# Patient Record
Sex: Female | Born: 1960
Health system: Southern US, Community
[De-identification: ages and names within clinical notes are randomized; demographics above are authoritative.]

## PROBLEM LIST (undated history)

## (undated) DIAGNOSIS — E7800 Pure hypercholesterolemia, unspecified: Secondary | ICD-10-CM

## (undated) DIAGNOSIS — Z8619 Personal history of other infectious and parasitic diseases: Secondary | ICD-10-CM

## (undated) DIAGNOSIS — H269 Unspecified cataract: Secondary | ICD-10-CM

## (undated) DIAGNOSIS — J309 Allergic rhinitis, unspecified: Secondary | ICD-10-CM

## (undated) DIAGNOSIS — K219 Gastro-esophageal reflux disease without esophagitis: Secondary | ICD-10-CM

## (undated) DIAGNOSIS — Z9889 Other specified postprocedural states: Secondary | ICD-10-CM

## (undated) DIAGNOSIS — T7840XA Allergy, unspecified, initial encounter: Secondary | ICD-10-CM

## (undated) DIAGNOSIS — D509 Iron deficiency anemia, unspecified: Secondary | ICD-10-CM

## (undated) DIAGNOSIS — F419 Anxiety disorder, unspecified: Secondary | ICD-10-CM

## (undated) DIAGNOSIS — I10 Essential (primary) hypertension: Secondary | ICD-10-CM

## (undated) DIAGNOSIS — E039 Hypothyroidism, unspecified: Secondary | ICD-10-CM

## (undated) DIAGNOSIS — K589 Irritable bowel syndrome without diarrhea: Secondary | ICD-10-CM

## (undated) DIAGNOSIS — D682 Hereditary deficiency of other clotting factors: Secondary | ICD-10-CM

## (undated) DIAGNOSIS — Z880 Allergy status to penicillin: Secondary | ICD-10-CM

## (undated) DIAGNOSIS — D519 Vitamin B12 deficiency anemia, unspecified: Secondary | ICD-10-CM

## (undated) DIAGNOSIS — R112 Nausea with vomiting, unspecified: Secondary | ICD-10-CM

## (undated) DIAGNOSIS — K200 Eosinophilic esophagitis: Secondary | ICD-10-CM

## (undated) DIAGNOSIS — G629 Polyneuropathy, unspecified: Secondary | ICD-10-CM

## (undated) DIAGNOSIS — J45909 Unspecified asthma, uncomplicated: Secondary | ICD-10-CM

## (undated) HISTORY — DX: Anxiety disorder, unspecified: F41.9

## (undated) HISTORY — DX: Allergic rhinitis, unspecified: J30.9

## (undated) HISTORY — DX: Irritable bowel syndrome, unspecified: K58.9

## (undated) HISTORY — DX: Gastro-esophageal reflux disease without esophagitis: K21.9

## (undated) HISTORY — PX: OTHER SURGICAL HISTORY: SHX169

## (undated) HISTORY — DX: Eosinophilic esophagitis: K20.0

## (undated) HISTORY — DX: Personal history of other infectious and parasitic diseases: Z86.19

## (undated) HISTORY — DX: Allergy, unspecified, initial encounter: T78.40XA

## (undated) HISTORY — PX: UPPER GASTROINTESTINAL ENDOSCOPY: SHX188

## (undated) HISTORY — DX: Iron deficiency anemia, unspecified: D50.9

## (undated) HISTORY — DX: Allergy status to penicillin: Z88.0

## (undated) HISTORY — DX: Hypothyroidism, unspecified: E03.9

## (undated) HISTORY — DX: Unspecified asthma, uncomplicated: J45.909

## (undated) HISTORY — DX: Vitamin B12 deficiency anemia, unspecified: D51.9

## (undated) HISTORY — DX: Hereditary deficiency of other clotting factors: D68.2

## (undated) HISTORY — PX: CHOLECYSTECTOMY: SHX55

## (undated) HISTORY — DX: Polyneuropathy, unspecified: G62.9

## (undated) HISTORY — DX: Unspecified cataract: H26.9

## (undated) HISTORY — DX: Other specified postprocedural states: Z98.890

## (undated) HISTORY — DX: Essential (primary) hypertension: I10

## (undated) HISTORY — PX: TOTAL ABDOMINAL HYSTERECTOMY: SHX209

## (undated) HISTORY — DX: Nausea with vomiting, unspecified: R11.2

## (undated) HISTORY — PX: COLONOSCOPY: SHX174

---

## 2014-06-16 HISTORY — PX: ESOPHAGOGASTRODUODENOSCOPY: SHX1529

## 2016-02-04 DIAGNOSIS — J0100 Acute maxillary sinusitis, unspecified: Secondary | ICD-10-CM | POA: Diagnosis not present

## 2016-02-11 DIAGNOSIS — J019 Acute sinusitis, unspecified: Secondary | ICD-10-CM | POA: Diagnosis not present

## 2016-02-11 DIAGNOSIS — K521 Toxic gastroenteritis and colitis: Secondary | ICD-10-CM | POA: Diagnosis not present

## 2016-02-11 DIAGNOSIS — Z6821 Body mass index (BMI) 21.0-21.9, adult: Secondary | ICD-10-CM | POA: Diagnosis not present

## 2016-03-06 DIAGNOSIS — F411 Generalized anxiety disorder: Secondary | ICD-10-CM | POA: Diagnosis not present

## 2016-04-16 DIAGNOSIS — E039 Hypothyroidism, unspecified: Secondary | ICD-10-CM | POA: Diagnosis not present

## 2016-04-16 DIAGNOSIS — E785 Hyperlipidemia, unspecified: Secondary | ICD-10-CM | POA: Diagnosis not present

## 2016-04-16 DIAGNOSIS — D519 Vitamin B12 deficiency anemia, unspecified: Secondary | ICD-10-CM | POA: Diagnosis not present

## 2016-04-16 DIAGNOSIS — D509 Iron deficiency anemia, unspecified: Secondary | ICD-10-CM | POA: Diagnosis not present

## 2016-04-16 DIAGNOSIS — I10 Essential (primary) hypertension: Secondary | ICD-10-CM | POA: Diagnosis not present

## 2016-04-16 DIAGNOSIS — F411 Generalized anxiety disorder: Secondary | ICD-10-CM | POA: Diagnosis not present

## 2016-04-16 DIAGNOSIS — Z1389 Encounter for screening for other disorder: Secondary | ICD-10-CM | POA: Diagnosis not present

## 2016-04-22 DIAGNOSIS — Z1231 Encounter for screening mammogram for malignant neoplasm of breast: Secondary | ICD-10-CM | POA: Diagnosis not present

## 2016-05-12 DIAGNOSIS — F411 Generalized anxiety disorder: Secondary | ICD-10-CM | POA: Diagnosis not present

## 2016-05-21 DIAGNOSIS — J019 Acute sinusitis, unspecified: Secondary | ICD-10-CM | POA: Diagnosis not present

## 2016-05-21 DIAGNOSIS — Z6821 Body mass index (BMI) 21.0-21.9, adult: Secondary | ICD-10-CM | POA: Diagnosis not present

## 2016-07-10 DIAGNOSIS — F411 Generalized anxiety disorder: Secondary | ICD-10-CM | POA: Diagnosis not present

## 2016-07-24 DIAGNOSIS — F411 Generalized anxiety disorder: Secondary | ICD-10-CM | POA: Diagnosis not present

## 2016-07-31 DIAGNOSIS — F411 Generalized anxiety disorder: Secondary | ICD-10-CM | POA: Diagnosis not present

## 2016-08-01 DIAGNOSIS — R8299 Other abnormal findings in urine: Secondary | ICD-10-CM | POA: Diagnosis not present

## 2016-08-01 DIAGNOSIS — J302 Other seasonal allergic rhinitis: Secondary | ICD-10-CM | POA: Diagnosis not present

## 2016-08-01 DIAGNOSIS — E039 Hypothyroidism, unspecified: Secondary | ICD-10-CM | POA: Diagnosis not present

## 2016-08-01 DIAGNOSIS — I10 Essential (primary) hypertension: Secondary | ICD-10-CM | POA: Diagnosis not present

## 2016-08-01 DIAGNOSIS — E785 Hyperlipidemia, unspecified: Secondary | ICD-10-CM | POA: Diagnosis not present

## 2016-08-05 DIAGNOSIS — M542 Cervicalgia: Secondary | ICD-10-CM | POA: Diagnosis not present

## 2016-08-05 DIAGNOSIS — R200 Anesthesia of skin: Secondary | ICD-10-CM | POA: Diagnosis not present

## 2016-08-06 DIAGNOSIS — F411 Generalized anxiety disorder: Secondary | ICD-10-CM | POA: Diagnosis not present

## 2016-08-11 DIAGNOSIS — E785 Hyperlipidemia, unspecified: Secondary | ICD-10-CM | POA: Diagnosis not present

## 2016-08-12 DIAGNOSIS — F411 Generalized anxiety disorder: Secondary | ICD-10-CM | POA: Diagnosis not present

## 2016-08-16 DIAGNOSIS — J0100 Acute maxillary sinusitis, unspecified: Secondary | ICD-10-CM | POA: Diagnosis not present

## 2016-08-20 DIAGNOSIS — F411 Generalized anxiety disorder: Secondary | ICD-10-CM | POA: Diagnosis not present

## 2016-08-22 DIAGNOSIS — L853 Xerosis cutis: Secondary | ICD-10-CM | POA: Diagnosis not present

## 2016-10-08 DIAGNOSIS — F411 Generalized anxiety disorder: Secondary | ICD-10-CM | POA: Diagnosis not present

## 2016-10-13 DIAGNOSIS — Z6821 Body mass index (BMI) 21.0-21.9, adult: Secondary | ICD-10-CM | POA: Diagnosis not present

## 2016-10-13 DIAGNOSIS — K580 Irritable bowel syndrome with diarrhea: Secondary | ICD-10-CM | POA: Diagnosis not present

## 2016-10-13 DIAGNOSIS — K219 Gastro-esophageal reflux disease without esophagitis: Secondary | ICD-10-CM | POA: Diagnosis not present

## 2016-10-21 DIAGNOSIS — E039 Hypothyroidism, unspecified: Secondary | ICD-10-CM | POA: Diagnosis not present

## 2016-10-21 DIAGNOSIS — Z6821 Body mass index (BMI) 21.0-21.9, adult: Secondary | ICD-10-CM | POA: Diagnosis not present

## 2016-10-21 DIAGNOSIS — I10 Essential (primary) hypertension: Secondary | ICD-10-CM | POA: Diagnosis not present

## 2016-10-21 DIAGNOSIS — E785 Hyperlipidemia, unspecified: Secondary | ICD-10-CM | POA: Diagnosis not present

## 2016-11-03 DIAGNOSIS — Z1231 Encounter for screening mammogram for malignant neoplasm of breast: Secondary | ICD-10-CM | POA: Diagnosis not present

## 2016-11-03 DIAGNOSIS — Z01419 Encounter for gynecological examination (general) (routine) without abnormal findings: Secondary | ICD-10-CM | POA: Diagnosis not present

## 2016-11-14 DIAGNOSIS — J0100 Acute maxillary sinusitis, unspecified: Secondary | ICD-10-CM | POA: Diagnosis not present

## 2016-11-17 DIAGNOSIS — J0100 Acute maxillary sinusitis, unspecified: Secondary | ICD-10-CM | POA: Diagnosis not present

## 2016-11-17 DIAGNOSIS — J209 Acute bronchitis, unspecified: Secondary | ICD-10-CM | POA: Diagnosis not present

## 2016-11-18 DIAGNOSIS — K200 Eosinophilic esophagitis: Secondary | ICD-10-CM | POA: Diagnosis not present

## 2016-11-24 ENCOUNTER — Ambulatory Visit (INDEPENDENT_AMBULATORY_CARE_PROVIDER_SITE_OTHER): Payer: Federal, State, Local not specified - PPO | Admitting: Allergy and Immunology

## 2016-11-24 ENCOUNTER — Encounter: Payer: Self-pay | Admitting: Allergy and Immunology

## 2016-11-24 VITALS — BP 120/88 | HR 68 | Resp 18 | Ht 62.68 in | Wt 122.0 lb

## 2016-11-24 DIAGNOSIS — K200 Eosinophilic esophagitis: Secondary | ICD-10-CM | POA: Diagnosis not present

## 2016-11-24 DIAGNOSIS — J3089 Other allergic rhinitis: Secondary | ICD-10-CM | POA: Diagnosis not present

## 2016-11-24 DIAGNOSIS — K219 Gastro-esophageal reflux disease without esophagitis: Secondary | ICD-10-CM | POA: Diagnosis not present

## 2016-11-24 MED ORDER — RANITIDINE HCL 300 MG PO TABS: 300.0000 mg | ORAL_TABLET | Freq: Every day | ORAL | 5 refills | Status: DC

## 2016-11-24 MED ORDER — MONTELUKAST SODIUM 10 MG PO TABS: 10.0000 mg | ORAL_TABLET | Freq: Every day | ORAL | 5 refills | Status: DC

## 2016-11-24 NOTE — Patient Instructions (Addendum)
  1. Discontinue all dairy consumption  2. Treat inflammation:   A. OTC Rhinocort one spray each nostril once a day  B. montelukast 10 mg tablet 1 time per day  C. prednisone 10 mg one time a day for 10 days only  D. Continue swallowed flovent  3. Treat reflux:   A. Nexium 40 mg tablet in AM  B. ranitidine 300 mg in PM  C. slowly taper off all forms of caffeine and chocolate  4. Return to clinic in 3 weeks or earlier if problem  5. Sinus CT scan?

## 2016-11-24 NOTE — Progress Notes (Signed)
Follow-up Note  Referring Provider: No ref. provider found Primary Provider: Nicoletta Dress, MD Date of Office Visit: 11/24/2016  Subjective:   Sherri Ruiz (DOB: 1961/10/16) is a 56 y.o. female who returns to the [facility] [place] on 11/24/2016 in re-evaluation of the following:  HPI: Sherri Ruiz returns to this clinic in reevaluation of respiratory tract problems that have arisen over the course of the past several months. I have not seen her in his clinic since February 2016 at which time she had a history of allergic rhinitis and eosinophilic esophagitis.  She complains of having constant postnasal drip and nasal congestion and nasal stuffiness and some occasional clear runny nose and has had problems with her "tummy". She's been having gas and cramping. She's had a decreased ability to smell recently although no ugly nasal discharge.  Apparently in October she was diagnosed with "sinusitis" and given an antibiotic and although her symptoms never completely resolved she did much better and then she became sick in November was diagnosed with "bronchitis" and given a shot of steroids. She was then referred back to Dr. Lyndel Safe assuming that some of her issue was related to reflux and he sent her here.  For her history of eosinophilic esophagitis she did quite well when she performed avoidance measures directed against dairy and egg and peanut and still continues to consume wheat. She also had a slow taper of swallowed steroids which she discontinued in February 2016. She is now eating yogurt on a regular basis. She is now back on swallowed flovent.  Allergies as of 11/24/2016      Reactions   Clindamycin/lincomycin    Meperidine Other (See Comments)   unknown   Penicillin G Other (See Comments)   Unknown      Medication List      CALCIUM PO Take by mouth daily.   cetirizine 10 MG tablet Commonly known as:  ZYRTEC Take 10 mg by mouth daily.   DIGESTIVE ADVANTAGE PO Take  by mouth daily.   IBUPROFEN PO Take by mouth as needed.   levothyroxine 50 MCG tablet Commonly known as:  SYNTHROID, LEVOTHROID Take 50 mcg by mouth daily before breakfast.   lisinopril 5 MG tablet Commonly known as:  PRINIVIL,ZESTRIL Take 5 mg by mouth daily.   MINIVELLE 0.1 MG/24HR patch Generic drug:  estradiol   MUCINEX CHILDRENS PO Take by mouth as needed.   MULTIVITAMIN PO Take by mouth daily.   NASACORT ALLERGY 24HR 55 MCG/ACT Aero nasal inhaler Generic drug:  triamcinolone Place 1 spray into the nose daily.   NEXIUM 40 MG capsule Generic drug:  esomeprazole Take 40 mg by mouth at bedtime.   VITAMIN C PO Take by mouth daily.       Past Medical History:  Diagnosis Date  . Allergic rhinitis   . Eosinophilic esophagitis   . High blood pressure   . Hypothyroidism   . Penicillin allergy     Past Surgical History:  Procedure Laterality Date  . CHOLECYSTECTOMY    . ESOPHAGUS SURGERY    . OTHER SURGICAL HISTORY     Hysterectomy    Review of systems negative except as noted in HPI / PMHx or noted below:  Review of Systems  Constitutional: Negative.   HENT: Negative.   Eyes: Negative.   Respiratory: Negative.   Cardiovascular: Negative.   Gastrointestinal: Negative.   Genitourinary: Negative.   Musculoskeletal: Negative.   Skin: Negative.   Neurological: Negative.   Endo/Heme/Allergies: Negative.  Psychiatric/Behavioral: Negative.      Objective:   Vitals:   11/24/16 1127  BP: 120/88  Pulse: 68  Resp: 18   Height: 5' 2.68" (159.2 cm)  Weight: 122 lb (55.3 kg)   Physical Exam  Constitutional: She is well-developed, well-nourished, and in no distress.  HENT:  Head: Normocephalic.  Right Ear: Tympanic membrane, external ear and ear canal normal.  Left Ear: Tympanic membrane, external ear and ear canal normal.  Nose: Nose normal. No mucosal edema or rhinorrhea.  Mouth/Throat: Uvula is midline, oropharynx is clear and moist and mucous  membranes are normal. No oropharyngeal exudate.  Eyes: Conjunctivae are normal.  Neck: Trachea normal. No tracheal tenderness present. No tracheal deviation present. No thyromegaly present.  Cardiovascular: Normal rate, regular rhythm, S1 normal, S2 normal and normal heart sounds.   No murmur heard. Pulmonary/Chest: Breath sounds normal. No stridor. No respiratory distress. She has no wheezes. She has no rales.  Musculoskeletal: She exhibits no edema.  Lymphadenopathy:       Head (right side): No tonsillar adenopathy present.       Head (left side): No tonsillar adenopathy present.    She has no cervical adenopathy.  Neurological: She is alert. Gait normal.  Skin: No rash noted. She is not diaphoretic. No erythema. Nails show no clubbing.  Psychiatric: Mood and affect normal.    Diagnostics: None  Assessment and Plan:   1. Other allergic rhinitis   2. LPRD (laryngopharyngeal reflux disease)   3. Eosinophilic esophagitis     1. Discontinue all dairy consumption  2. Treat inflammation:   A. OTC Rhinocort one spray each nostril once a day  B. montelukast 10 mg tablet 1 time per day  C. prednisone 10 mg one time a day for 10 days only  D. Continue swallowed flovent  3. Treat reflux:   A. Nexium 40 mg tablet in AM  B. ranitidine 300 mg in PM  C. slowly taper off all forms of caffeine and chocolate  4. Return to clinic in 3 weeks or earlier if problem  5. Sinus CT scan?  I will assume that Sherri Ruiz may have significant inflammation of her upper airway and have her use anti-inflammatory agents as noted above and in addition she may have a little bit more LPR for which she will utilize the plan mentioned above to address her reflux. I've also encouraged her to eliminate all dairy consumption as she did so much better in the past with her eosinophilic esophagitis while all remaining off all dairy. I'll see her back in this clinic in 3 weeks or earlier if there is a problem. If she  still continues to have upper airway symptoms will image her upper airway.  Allena Katz, MD [place]

## 2016-11-26 ENCOUNTER — Telehealth: Payer: Self-pay | Admitting: *Deleted

## 2016-11-26 NOTE — Telephone Encounter (Signed)
Patient advised, she already has famotidine at home.

## 2016-11-26 NOTE — Telephone Encounter (Signed)
Famotidine 40mg  would be fine

## 2016-11-26 NOTE — Telephone Encounter (Signed)
You prescribed Ranitidine 300 for Sherri Ruiz when you saw her on Monday and she is struggling to swallow the tablets because of the size. She would like to know if she can take Famotidine instead. Please advise.

## 2016-12-15 ENCOUNTER — Encounter: Payer: Self-pay | Admitting: Allergy and Immunology

## 2016-12-15 ENCOUNTER — Ambulatory Visit (INDEPENDENT_AMBULATORY_CARE_PROVIDER_SITE_OTHER): Payer: Federal, State, Local not specified - PPO | Admitting: Allergy and Immunology

## 2016-12-15 VITALS — BP 130/74 | HR 64 | Resp 16

## 2016-12-15 DIAGNOSIS — K200 Eosinophilic esophagitis: Secondary | ICD-10-CM | POA: Diagnosis not present

## 2016-12-15 DIAGNOSIS — J3089 Other allergic rhinitis: Secondary | ICD-10-CM | POA: Diagnosis not present

## 2016-12-15 DIAGNOSIS — K219 Gastro-esophageal reflux disease without esophagitis: Secondary | ICD-10-CM

## 2016-12-15 NOTE — Patient Instructions (Addendum)
  1. Discontinue all dairy consumption: Almond milk?  2. Treat inflammation:   A. OTC Rhinocort one spray each nostril once a day  B. montelukast 10 mg tablet 1 time per day  3. Treat reflux:   A. Nexium 40 mg tablet in AM  B. famotidine 40 mg in PM  C. continue off all forms of caffeine and chocolate  4. Rhinoscopy with ENT to examine right sided 'lump' - Dr. Gaylyn Cheers 12/22/16       At 1:30 p  5. Return in 12 weeks or earlier if problem

## 2016-12-15 NOTE — Progress Notes (Signed)
Follow-up Note  Referring Provider: Nicoletta Dress, MD Primary Provider: Nicoletta Dress, MD Date of Office Visit: 12/15/2016  Subjective:   Sherri Ruiz (DOB: 1961-07-26) is a 56 y.o. female who returns to the [facility] [place] on 12/15/2016 in re-evaluation of the following:  HPI: Heily returns to this clinic in reevaluation of her allergic rhinitis and eosinophilic esophagitis. I'll last saw her in this clinic on 11/24/2016 at which point in time she appeared to be having issues with both her throat and upper airways.  Her throat is improved. She still continues to have a "lump" on the right side of her throat whenever she swallows.  Her upper airway congestion has resolved. She's not been having any significant respiratory tract symptoms involving both her upper or lower respiratory tract. She can smell without any difficulty at this point.  The issue with her "tummy" has resolved. She's not been having any cramping.  She is not entirely dairy free but is very close. She has eliminated caffeine and almost all chocolate consumption. She is not using any swallowed Flovent but has been consistently using a nasal steroid and a leukotriene modifier and Nexium and famotidine.  Allergies as of 12/15/2016      Reactions   Clindamycin/lincomycin    Meperidine Other (See Comments)   unknown   Penicillin G Other (See Comments)   Unknown      Medication List      CALCIUM PO Take by mouth daily.   DIGESTIVE ADVANTAGE PO Take by mouth daily.   famotidine 40 MG tablet Commonly known as:  PEPCID Take 40 mg by mouth daily.   levothyroxine 50 MCG tablet Commonly known as:  SYNTHROID, LEVOTHROID Take 50 mcg by mouth daily before breakfast.   lisinopril 5 MG tablet Commonly known as:  PRINIVIL,ZESTRIL Take 5 mg by mouth daily.   MINIVELLE 0.1 MG/24HR patch Generic drug:  estradiol   montelukast 10 MG tablet Commonly known as:  SINGULAIR Take 1 tablet (10 mg  total) by mouth at bedtime.   MULTIVITAMIN PO Take by mouth daily.   NEXIUM 40 MG capsule Generic drug:  esomeprazole Take 40 mg by mouth at bedtime.   RHINOCORT ALLERGY 32 MCG/ACT nasal spray Generic drug:  budesonide Place 1 spray into both nostrils daily.   VITAMIN C PO Take by mouth daily.       Past Medical History:  Diagnosis Date  . Allergic rhinitis   . Eosinophilic esophagitis   . High blood pressure   . Hypothyroidism   . Penicillin allergy     Past Surgical History:  Procedure Laterality Date  . CHOLECYSTECTOMY    . ESOPHAGUS SURGERY    . OTHER SURGICAL HISTORY     Hysterectomy    Review of systems negative except as noted in HPI / PMHx or noted below:  Review of Systems  Constitutional: Negative.   HENT: Negative.   Eyes: Negative.   Respiratory: Negative.   Cardiovascular: Negative.   Gastrointestinal: Negative.   Genitourinary: Negative.   Musculoskeletal: Negative.   Skin: Negative.   Neurological: Negative.   Endo/Heme/Allergies: Negative.   Psychiatric/Behavioral: Negative.      Objective:   Vitals:   12/15/16 1120  BP: 130/74  Pulse: 64  Resp: 16          Physical Exam  Constitutional: She is well-developed, well-nourished, and in no distress.  HENT:  Head: Normocephalic.  Right Ear: Tympanic membrane, external ear and ear canal normal.  Left Ear: Tympanic membrane, external ear and ear canal normal.  Nose: Nose normal. No mucosal edema or rhinorrhea.  Mouth/Throat: Uvula is midline, oropharynx is clear and moist and mucous membranes are normal. No oropharyngeal exudate.  Eyes: Conjunctivae are normal.  Neck: Trachea normal. No tracheal tenderness present. No tracheal deviation present. No thyromegaly present.  Cardiovascular: Normal rate, regular rhythm, S1 normal, S2 normal and normal heart sounds.   No murmur heard. Pulmonary/Chest: Breath sounds normal. No stridor. No respiratory distress. She has no wheezes. She has  no rales.  Musculoskeletal: She exhibits no edema.  Lymphadenopathy:       Head (right side): No tonsillar adenopathy present.       Head (left side): No tonsillar adenopathy present.    She has no cervical adenopathy.  Neurological: She is alert. Gait normal.  Skin: No rash noted. She is not diaphoretic. No erythema. Nails show no clubbing.  Psychiatric: Mood and affect normal.    Diagnostics: none  Assessment and Plan:   1. Other allergic rhinitis   2. LPRD (laryngopharyngeal reflux disease)   3. Eosinophilic esophagitis     1. Discontinue all dairy consumption: Almond milk?  2. Treat inflammation:   A. OTC Rhinocort one spray each nostril once a day  B. montelukast 10 mg tablet 1 time per day  3. Treat reflux:   A. Nexium 40 mg tablet in AM  B. famotidine 40 mg in PM  C. continue off all forms of caffeine and chocolate  4. Rhinoscopy with ENT to examine right sided throat 'lump'  5. Return in 12 weeks or earlier if problem  Ersie is better and I would like to continue her on anti-inflammatory medications for her respiratory tract and therapy directed against reflux as specified above and remain away from all dairy consumption assuming that this is the trigger giving rise to her eosinophilic esophagitis. However, I would like to have a ENT look at her throat given the fact that she does have a persistent area on the right side that feels as though there is a lump located in that area. We will try to get that appointment arranged sometime in the week or so. I will see her back in this clinic in 12 weeks or earlier if there is a problem.  Allena Katz, MD [facility] [place]

## 2016-12-17 ENCOUNTER — Telehealth: Payer: Self-pay | Admitting: Allergy and Immunology

## 2016-12-17 NOTE — Telephone Encounter (Signed)
Tried to call, unable to leave voicemail.

## 2016-12-17 NOTE — Telephone Encounter (Signed)
Sherri Ruiz wants to know if she can change her nexium to PM and famotidine to AM.  She is having trouble sleeping and thinks it has something to do with the medications.  Please Advise

## 2016-12-17 NOTE — Telephone Encounter (Signed)
She can try that combination and let use know.

## 2016-12-18 DIAGNOSIS — S29011A Strain of muscle and tendon of front wall of thorax, initial encounter: Secondary | ICD-10-CM | POA: Diagnosis not present

## 2016-12-18 DIAGNOSIS — E785 Hyperlipidemia, unspecified: Secondary | ICD-10-CM | POA: Diagnosis not present

## 2016-12-18 DIAGNOSIS — I10 Essential (primary) hypertension: Secondary | ICD-10-CM | POA: Diagnosis not present

## 2016-12-18 NOTE — Telephone Encounter (Signed)
Tried to call again, voicemail still full.

## 2016-12-19 DIAGNOSIS — F411 Generalized anxiety disorder: Secondary | ICD-10-CM | POA: Diagnosis not present

## 2016-12-19 NOTE — Telephone Encounter (Signed)
Patient advised of instructions. 

## 2016-12-26 DIAGNOSIS — S9032XA Contusion of left foot, initial encounter: Secondary | ICD-10-CM | POA: Diagnosis not present

## 2017-01-08 DIAGNOSIS — F411 Generalized anxiety disorder: Secondary | ICD-10-CM | POA: Diagnosis not present

## 2017-01-13 DIAGNOSIS — J309 Allergic rhinitis, unspecified: Secondary | ICD-10-CM | POA: Diagnosis not present

## 2017-01-13 DIAGNOSIS — Z6820 Body mass index (BMI) 20.0-20.9, adult: Secondary | ICD-10-CM | POA: Diagnosis not present

## 2017-01-13 DIAGNOSIS — J329 Chronic sinusitis, unspecified: Secondary | ICD-10-CM | POA: Diagnosis not present

## 2017-01-13 DIAGNOSIS — J019 Acute sinusitis, unspecified: Secondary | ICD-10-CM | POA: Diagnosis not present

## 2017-01-19 DIAGNOSIS — J342 Deviated nasal septum: Secondary | ICD-10-CM | POA: Diagnosis not present

## 2017-01-19 DIAGNOSIS — J343 Hypertrophy of nasal turbinates: Secondary | ICD-10-CM | POA: Diagnosis not present

## 2017-01-19 DIAGNOSIS — J3489 Other specified disorders of nose and nasal sinuses: Secondary | ICD-10-CM | POA: Diagnosis not present

## 2017-01-19 DIAGNOSIS — R0981 Nasal congestion: Secondary | ICD-10-CM | POA: Diagnosis not present

## 2017-01-20 DIAGNOSIS — F411 Generalized anxiety disorder: Secondary | ICD-10-CM | POA: Diagnosis not present

## 2017-01-26 DIAGNOSIS — J342 Deviated nasal septum: Secondary | ICD-10-CM | POA: Diagnosis not present

## 2017-01-26 DIAGNOSIS — J329 Chronic sinusitis, unspecified: Secondary | ICD-10-CM | POA: Diagnosis not present

## 2017-02-03 DIAGNOSIS — J3489 Other specified disorders of nose and nasal sinuses: Secondary | ICD-10-CM | POA: Diagnosis not present

## 2017-02-03 DIAGNOSIS — J343 Hypertrophy of nasal turbinates: Secondary | ICD-10-CM | POA: Diagnosis not present

## 2017-02-03 DIAGNOSIS — J342 Deviated nasal septum: Secondary | ICD-10-CM | POA: Diagnosis not present

## 2017-02-03 DIAGNOSIS — J329 Chronic sinusitis, unspecified: Secondary | ICD-10-CM | POA: Diagnosis not present

## 2017-02-04 DIAGNOSIS — J329 Chronic sinusitis, unspecified: Secondary | ICD-10-CM | POA: Diagnosis not present

## 2017-02-04 DIAGNOSIS — J3489 Other specified disorders of nose and nasal sinuses: Secondary | ICD-10-CM | POA: Diagnosis not present

## 2017-02-04 DIAGNOSIS — J342 Deviated nasal septum: Secondary | ICD-10-CM | POA: Diagnosis not present

## 2017-02-04 DIAGNOSIS — J343 Hypertrophy of nasal turbinates: Secondary | ICD-10-CM | POA: Diagnosis not present

## 2017-02-05 ENCOUNTER — Telehealth: Payer: Self-pay | Admitting: *Deleted

## 2017-02-05 NOTE — Telephone Encounter (Signed)
Sherri Ruiz saw Dr. Gaylyn Cheers recently and he is suggesting that she have surgery to correct her deviated septum in hopes that it will prevent sinus infections. She states that she finished a round of antibiotic and steroids and her sinus infection has cleared up. She did say that in the past, she typically has struggled to get rid of infections. She wants your advice about the surgery and whether or not she would benefit or if she should not have the surgery. Please advise.

## 2017-02-09 NOTE — Telephone Encounter (Signed)
Patient advised of instructions. 

## 2017-02-09 NOTE — Telephone Encounter (Signed)
I would recommend that she continue on treatment for the full 12 weeks (From February) and then we can discuss during her RV. There is no rush unless something changes regarding her upper airways.

## 2017-02-10 DIAGNOSIS — K581 Irritable bowel syndrome with constipation: Secondary | ICD-10-CM | POA: Diagnosis not present

## 2017-02-10 DIAGNOSIS — K200 Eosinophilic esophagitis: Secondary | ICD-10-CM | POA: Diagnosis not present

## 2017-02-26 ENCOUNTER — Other Ambulatory Visit: Payer: Self-pay | Admitting: *Deleted

## 2017-02-26 MED ORDER — MONTELUKAST SODIUM 10 MG PO TABS: ORAL_TABLET | ORAL | 0 refills | Status: DC

## 2017-03-05 DIAGNOSIS — K080 Exfoliation of teeth due to systemic causes: Secondary | ICD-10-CM | POA: Diagnosis not present

## 2017-03-09 ENCOUNTER — Ambulatory Visit: Payer: Federal, State, Local not specified - PPO | Admitting: Allergy and Immunology

## 2017-03-16 ENCOUNTER — Ambulatory Visit: Payer: Federal, State, Local not specified - PPO | Admitting: Allergy and Immunology

## 2017-03-20 ENCOUNTER — Ambulatory Visit: Payer: Federal, State, Local not specified - PPO | Admitting: Allergy and Immunology

## 2017-03-26 ENCOUNTER — Encounter: Payer: Self-pay | Admitting: Allergy and Immunology

## 2017-03-26 ENCOUNTER — Ambulatory Visit (INDEPENDENT_AMBULATORY_CARE_PROVIDER_SITE_OTHER): Payer: Federal, State, Local not specified - PPO | Admitting: Allergy and Immunology

## 2017-03-26 VITALS — BP 130/82 | HR 76 | Resp 16

## 2017-03-26 DIAGNOSIS — K219 Gastro-esophageal reflux disease without esophagitis: Secondary | ICD-10-CM | POA: Diagnosis not present

## 2017-03-26 DIAGNOSIS — G43909 Migraine, unspecified, not intractable, without status migrainosus: Secondary | ICD-10-CM | POA: Diagnosis not present

## 2017-03-26 DIAGNOSIS — K200 Eosinophilic esophagitis: Secondary | ICD-10-CM

## 2017-03-26 DIAGNOSIS — J3089 Other allergic rhinitis: Secondary | ICD-10-CM | POA: Diagnosis not present

## 2017-03-26 MED ORDER — NEXIUM 40 MG PO CPDR: DELAYED_RELEASE_CAPSULE | ORAL | 5 refills | Status: DC

## 2017-03-26 MED ORDER — FAMOTIDINE 40 MG PO TABS: ORAL_TABLET | ORAL | 5 refills | Status: DC

## 2017-03-26 MED ORDER — MONTELUKAST SODIUM 10 MG PO TABS: ORAL_TABLET | ORAL | 1 refills | Status: DC

## 2017-03-26 NOTE — Patient Instructions (Addendum)
  1. Continue to avoid all dairy consumption  2. Continue to Treat inflammation:   A. OTC Rhinocort one spray each nostril once a day  B. montelukast 10 mg tablet 1 time per day  3. Continue to Treat reflux:   A. Nexium 40 mg tablet in PM  B. famotidine 40 mg in AM  C. continue off all forms of caffeine and chocolate  4. Can add OTC antihistamine - loratadine/fexofenadine/cetirizine   5. Return in November 2018 or earlier if problem

## 2017-03-26 NOTE — Progress Notes (Signed)
Follow-up Note  Referring Provider: Nicoletta Dress, MD Primary Provider: Nicoletta Dress, MD Date of Office Visit: 03/26/2017  Subjective:   Sherri Ruiz (DOB: 28-Nov-1960) is a 56 y.o. female who returns to the [facility] [place] on 03/26/2017 in re-evaluation of the following:  HPI: Sherri Ruiz returns to this clinic in reevaluation of her eosinophilic esophagitis and allergic rhinitis. I last saw her in this clinic February 2018 at which point in time she had the sensation of a "lump" on the right side of her throat for which we referred her to ENT.    She appears to be doing quite well with her eosinophilic esophagitis even though she is not using a swallowed steroid at this point in time. While remaining away from dairy and using a combination of Nexium and famotidine and being very careful about caffeine and chocolate consumption she has done very well and has not had any obstructive episodes.  She was apparently developing a issue with a facial pain and fullness around her nasal bridge and eyes and she was seen by Dr. Delena Bali who treated her with antibiotics and a steroid shot and prednisone in March and apparently a follow-up CT scan identified no sinusitis. She did visit with an ENT doctor in evaluation of the "lump" and apparently there was no significant abnormality although there was identification of a deviated septum for which surgery was recommended.  Interestingly, Sherri Ruiz describes these episodes where she gets this intense nasal bridge and periorbital pressure and subsequently develops nausea and this entire episode usually lasts about 15 minutes. She believes that this occurs while she is outdoors and when she goes indoors she will develop resolution of this issue. She has had two of these episodes the past month. In the past she has had these more frequently and she is much better while consistently using her Rhinocort and montelukast regarding this issue.  She has a  distant history of asthma that has not required any therapy in quite a long period in time. She has started running outdoors again without any difficulty.  Allergies as of 03/26/2017      Reactions   Clindamycin/lincomycin    Meperidine Other (See Comments)   unknown   Penicillin G Other (See Comments)   Unknown      Medication List      CALCIUM PO Take by mouth daily.   DIGESTIVE ADVANTAGE PO Take by mouth daily.   famotidine 40 MG tablet Commonly known as:  PEPCID Take 40 mg by mouth daily.   levothyroxine 50 MCG tablet Commonly known as:  SYNTHROID, LEVOTHROID Take 50 mcg by mouth daily before breakfast.   lisinopril 5 MG tablet Commonly known as:  PRINIVIL,ZESTRIL Take 5 mg by mouth daily.   MINIVELLE 0.1 MG/24HR patch Generic drug:  estradiol   montelukast 10 MG tablet Commonly known as:  SINGULAIR Take one tablet once daily as directed   MULTIVITAMIN PO Take by mouth daily.   NEXIUM 40 MG capsule Generic drug:  esomeprazole Take 40 mg by mouth at bedtime.   RHINOCORT ALLERGY 32 MCG/ACT nasal spray Generic drug:  budesonide Place 1 spray into both nostrils daily.   VITAMIN C PO Take by mouth daily.       Past Medical History:  Diagnosis Date  . Allergic rhinitis   . Eosinophilic esophagitis   . High blood pressure   . Hypothyroidism   . LPRD (laryngopharyngeal reflux disease)   . Penicillin allergy  Past Surgical History:  Procedure Laterality Date  . CHOLECYSTECTOMY    . ESOPHAGUS SURGERY    . OTHER SURGICAL HISTORY     Hysterectomy    Review of systems negative except as noted in HPI / PMHx or noted below:  Review of Systems  Constitutional: Negative.   HENT: Negative.   Eyes: Negative.   Respiratory: Negative.   Cardiovascular: Negative.   Gastrointestinal: Negative.   Genitourinary: Negative.   Musculoskeletal: Negative.   Skin: Negative.   Neurological: Negative.   Endo/Heme/Allergies: Negative.     Psychiatric/Behavioral: Negative.      Objective:   Vitals:   03/26/17 1130  BP: 130/82  Pulse: 76  Resp: 16          Physical Exam  Constitutional: She is well-developed, well-nourished, and in no distress.  HENT:  Head: Normocephalic.  Right Ear: Tympanic membrane, external ear and ear canal normal.  Left Ear: Tympanic membrane, external ear and ear canal normal.  Nose: Nose normal. No mucosal edema or rhinorrhea.  Mouth/Throat: Uvula is midline, oropharynx is clear and moist and mucous membranes are normal. No oropharyngeal exudate.  Eyes: Conjunctivae are normal.  Neck: Trachea normal. No tracheal tenderness present. No tracheal deviation present. No thyromegaly present.  Cardiovascular: Normal rate, regular rhythm, S1 normal, S2 normal and normal heart sounds.   No murmur heard. Pulmonary/Chest: Breath sounds normal. No stridor. No respiratory distress. She has no wheezes. She has no rales.  Musculoskeletal: She exhibits no edema.  Lymphadenopathy:       Head (right side): No tonsillar adenopathy present.       Head (left side): No tonsillar adenopathy present.    She has no cervical adenopathy.  Neurological: She is alert. Gait normal.  Skin: No rash noted. She is not diaphoretic. No erythema. Nails show no clubbing.  Psychiatric: Mood and affect normal.    Diagnostics: none   Assessment and Plan:   1. Eosinophilic esophagitis   2. LPRD (laryngopharyngeal reflux disease)   3. Other allergic rhinitis   4. Migraine syndrome     1. Continue to avoid all dairy consumption  2. Continue to Treat inflammation:   A. OTC Rhinocort one spray each nostril once a day  B. montelukast 10 mg tablet 1 time per day  3. Continue to Treat reflux:   A. Nexium 40 mg tablet in PM  B. famotidine 40 mg in AM  C. continue off all forms of caffeine and chocolate  4. Can add OTC antihistamine - loratadine/fexofenadine/cetirizine   5. Return in November 2018 or earlier if  problem  Sherri Ruiz appears to be doing relatively well regarding her eosinophilic esophagitis and I'm not going to change any of her therapy at this point in time but she is welcome to discontinue her famotidine to make a determination about whether or not she requires this medication while consistently using her Nexium at this point. It does sound as though she is developing migraines and it is quite possible she has an allergic trigger giving rise to these migraines. But her frequency of migraines is relatively low  And the intensity appears to be low and were not really going to have her use any additional therapy except for that described above to address her atopic disease and what appears to be a migraine syndrome. I will see her back in this clinic in 2018 or earlier if there is a problem.  Allena Katz, MD [facility] [place]

## 2017-04-02 DIAGNOSIS — J019 Acute sinusitis, unspecified: Secondary | ICD-10-CM | POA: Diagnosis not present

## 2017-04-02 DIAGNOSIS — J3489 Other specified disorders of nose and nasal sinuses: Secondary | ICD-10-CM | POA: Diagnosis not present

## 2017-04-02 DIAGNOSIS — J343 Hypertrophy of nasal turbinates: Secondary | ICD-10-CM | POA: Diagnosis not present

## 2017-04-02 DIAGNOSIS — F411 Generalized anxiety disorder: Secondary | ICD-10-CM | POA: Diagnosis not present

## 2017-04-02 DIAGNOSIS — D519 Vitamin B12 deficiency anemia, unspecified: Secondary | ICD-10-CM | POA: Diagnosis not present

## 2017-04-02 DIAGNOSIS — J342 Deviated nasal septum: Secondary | ICD-10-CM | POA: Diagnosis not present

## 2017-04-14 DIAGNOSIS — F411 Generalized anxiety disorder: Secondary | ICD-10-CM | POA: Diagnosis not present

## 2017-04-21 DIAGNOSIS — E039 Hypothyroidism, unspecified: Secondary | ICD-10-CM | POA: Diagnosis not present

## 2017-04-21 DIAGNOSIS — I10 Essential (primary) hypertension: Secondary | ICD-10-CM | POA: Diagnosis not present

## 2017-04-21 DIAGNOSIS — K219 Gastro-esophageal reflux disease without esophagitis: Secondary | ICD-10-CM | POA: Diagnosis not present

## 2017-04-21 DIAGNOSIS — Z1389 Encounter for screening for other disorder: Secondary | ICD-10-CM | POA: Diagnosis not present

## 2017-04-21 DIAGNOSIS — E785 Hyperlipidemia, unspecified: Secondary | ICD-10-CM | POA: Diagnosis not present

## 2017-06-01 ENCOUNTER — Other Ambulatory Visit: Payer: Self-pay | Admitting: Allergy and Immunology

## 2017-06-01 NOTE — Telephone Encounter (Signed)
Sherri Ruiz called in and stated her gums have been extremely sensitive and have been bleeding a lot lately.  She also stated that she did a "google" search and found the culprit was PEPCID.  She was wondering if there was anything else she can take in its place, or lower the dose, whichever is best.  Please advise.

## 2017-06-01 NOTE — Telephone Encounter (Signed)
I have spoken with patient and advised on discontinuation of the famotidine. I advised to use tums if needed. She will call back in 1-2 weeks and let us know how the gums are doing.

## 2017-06-01 NOTE — Telephone Encounter (Signed)
Please advise and thank you. 

## 2017-06-01 NOTE — Telephone Encounter (Signed)
Please have patient discontinue famotidine without any replacement other than OTC antacids such as Tums. We need to determine if it is the famotidine giving rise to bleeding gums. If she still has bleeding gums she will require blood tests and she needs to contact me.

## 2017-06-03 DIAGNOSIS — J320 Chronic maxillary sinusitis: Secondary | ICD-10-CM | POA: Diagnosis not present

## 2017-06-03 DIAGNOSIS — K068 Other specified disorders of gingiva and edentulous alveolar ridge: Secondary | ICD-10-CM | POA: Diagnosis not present

## 2017-07-02 DIAGNOSIS — F411 Generalized anxiety disorder: Secondary | ICD-10-CM | POA: Diagnosis not present

## 2017-07-07 DIAGNOSIS — K580 Irritable bowel syndrome with diarrhea: Secondary | ICD-10-CM | POA: Diagnosis not present

## 2017-07-07 DIAGNOSIS — J019 Acute sinusitis, unspecified: Secondary | ICD-10-CM | POA: Diagnosis not present

## 2017-07-07 DIAGNOSIS — Z6821 Body mass index (BMI) 21.0-21.9, adult: Secondary | ICD-10-CM | POA: Diagnosis not present

## 2017-07-16 DIAGNOSIS — K080 Exfoliation of teeth due to systemic causes: Secondary | ICD-10-CM | POA: Diagnosis not present

## 2017-07-30 DIAGNOSIS — K080 Exfoliation of teeth due to systemic causes: Secondary | ICD-10-CM | POA: Diagnosis not present

## 2017-08-12 DIAGNOSIS — J0100 Acute maxillary sinusitis, unspecified: Secondary | ICD-10-CM | POA: Diagnosis not present

## 2017-09-04 DIAGNOSIS — J029 Acute pharyngitis, unspecified: Secondary | ICD-10-CM | POA: Diagnosis not present

## 2017-09-21 ENCOUNTER — Ambulatory Visit: Payer: Federal, State, Local not specified - PPO | Admitting: Allergy and Immunology

## 2017-10-08 DIAGNOSIS — R109 Unspecified abdominal pain: Secondary | ICD-10-CM | POA: Diagnosis not present

## 2017-10-08 DIAGNOSIS — R1011 Right upper quadrant pain: Secondary | ICD-10-CM | POA: Diagnosis not present

## 2017-10-08 DIAGNOSIS — K2970 Gastritis, unspecified, without bleeding: Secondary | ICD-10-CM | POA: Diagnosis not present

## 2017-10-10 DIAGNOSIS — K2900 Acute gastritis without bleeding: Secondary | ICD-10-CM | POA: Diagnosis not present

## 2017-10-10 DIAGNOSIS — D1771 Benign lipomatous neoplasm of kidney: Secondary | ICD-10-CM | POA: Diagnosis not present

## 2017-10-10 DIAGNOSIS — R6889 Other general symptoms and signs: Secondary | ICD-10-CM | POA: Diagnosis not present

## 2017-10-10 DIAGNOSIS — R112 Nausea with vomiting, unspecified: Secondary | ICD-10-CM | POA: Diagnosis not present

## 2017-10-13 DIAGNOSIS — R1011 Right upper quadrant pain: Secondary | ICD-10-CM | POA: Diagnosis not present

## 2017-10-15 DIAGNOSIS — K080 Exfoliation of teeth due to systemic causes: Secondary | ICD-10-CM | POA: Diagnosis not present

## 2017-11-13 DIAGNOSIS — J019 Acute sinusitis, unspecified: Secondary | ICD-10-CM | POA: Diagnosis not present

## 2017-11-13 DIAGNOSIS — Z6821 Body mass index (BMI) 21.0-21.9, adult: Secondary | ICD-10-CM | POA: Diagnosis not present

## 2017-11-13 DIAGNOSIS — Z20828 Contact with and (suspected) exposure to other viral communicable diseases: Secondary | ICD-10-CM | POA: Diagnosis not present

## 2017-11-16 ENCOUNTER — Other Ambulatory Visit: Payer: Self-pay | Admitting: Allergy and Immunology

## 2017-11-16 NOTE — Telephone Encounter (Signed)
Courtesy refill  

## 2017-11-18 DIAGNOSIS — F411 Generalized anxiety disorder: Secondary | ICD-10-CM | POA: Diagnosis not present

## 2017-12-08 DIAGNOSIS — Z01419 Encounter for gynecological examination (general) (routine) without abnormal findings: Secondary | ICD-10-CM | POA: Diagnosis not present

## 2017-12-10 DIAGNOSIS — K200 Eosinophilic esophagitis: Secondary | ICD-10-CM | POA: Diagnosis not present

## 2017-12-10 DIAGNOSIS — K3189 Other diseases of stomach and duodenum: Secondary | ICD-10-CM | POA: Diagnosis not present

## 2017-12-18 DIAGNOSIS — M8589 Other specified disorders of bone density and structure, multiple sites: Secondary | ICD-10-CM | POA: Diagnosis not present

## 2017-12-18 DIAGNOSIS — Z1231 Encounter for screening mammogram for malignant neoplasm of breast: Secondary | ICD-10-CM | POA: Diagnosis not present

## 2017-12-18 DIAGNOSIS — Z13820 Encounter for screening for osteoporosis: Secondary | ICD-10-CM | POA: Diagnosis not present

## 2017-12-20 ENCOUNTER — Other Ambulatory Visit: Payer: Self-pay | Admitting: Allergy and Immunology

## 2017-12-25 DIAGNOSIS — K200 Eosinophilic esophagitis: Secondary | ICD-10-CM | POA: Diagnosis not present

## 2017-12-25 DIAGNOSIS — K2900 Acute gastritis without bleeding: Secondary | ICD-10-CM | POA: Diagnosis not present

## 2017-12-25 DIAGNOSIS — K228 Other specified diseases of esophagus: Secondary | ICD-10-CM | POA: Diagnosis not present

## 2017-12-25 DIAGNOSIS — K2950 Unspecified chronic gastritis without bleeding: Secondary | ICD-10-CM | POA: Diagnosis not present

## 2017-12-25 DIAGNOSIS — K317 Polyp of stomach and duodenum: Secondary | ICD-10-CM | POA: Diagnosis not present

## 2017-12-25 DIAGNOSIS — Z8719 Personal history of other diseases of the digestive system: Secondary | ICD-10-CM | POA: Diagnosis not present

## 2017-12-25 DIAGNOSIS — K219 Gastro-esophageal reflux disease without esophagitis: Secondary | ICD-10-CM | POA: Diagnosis not present

## 2017-12-25 DIAGNOSIS — K3189 Other diseases of stomach and duodenum: Secondary | ICD-10-CM | POA: Diagnosis not present

## 2017-12-25 HISTORY — PX: ESOPHAGOGASTRODUODENOSCOPY: SHX1529

## 2017-12-31 DIAGNOSIS — K080 Exfoliation of teeth due to systemic causes: Secondary | ICD-10-CM | POA: Diagnosis not present

## 2018-01-01 DIAGNOSIS — H26491 Other secondary cataract, right eye: Secondary | ICD-10-CM | POA: Diagnosis not present

## 2018-01-01 DIAGNOSIS — H524 Presbyopia: Secondary | ICD-10-CM | POA: Diagnosis not present

## 2018-01-01 DIAGNOSIS — H2512 Age-related nuclear cataract, left eye: Secondary | ICD-10-CM | POA: Diagnosis not present

## 2018-01-02 DIAGNOSIS — H8113 Benign paroxysmal vertigo, bilateral: Secondary | ICD-10-CM | POA: Diagnosis not present

## 2018-01-02 DIAGNOSIS — R5383 Other fatigue: Secondary | ICD-10-CM | POA: Diagnosis not present

## 2018-01-04 ENCOUNTER — Other Ambulatory Visit: Payer: Self-pay | Admitting: Allergy and Immunology

## 2018-01-20 ENCOUNTER — Ambulatory Visit: Payer: Federal, State, Local not specified - PPO | Admitting: Allergy and Immunology

## 2018-01-20 DIAGNOSIS — H26491 Other secondary cataract, right eye: Secondary | ICD-10-CM | POA: Diagnosis not present

## 2018-01-22 DIAGNOSIS — B349 Viral infection, unspecified: Secondary | ICD-10-CM | POA: Diagnosis not present

## 2018-01-26 DIAGNOSIS — F411 Generalized anxiety disorder: Secondary | ICD-10-CM | POA: Diagnosis not present

## 2018-01-27 ENCOUNTER — Ambulatory Visit: Payer: Federal, State, Local not specified - PPO | Admitting: Allergy and Immunology

## 2018-01-28 ENCOUNTER — Encounter: Payer: Self-pay | Admitting: Allergy and Immunology

## 2018-01-28 ENCOUNTER — Ambulatory Visit (INDEPENDENT_AMBULATORY_CARE_PROVIDER_SITE_OTHER): Payer: Federal, State, Local not specified - PPO | Admitting: Allergy and Immunology

## 2018-01-28 VITALS — BP 130/80 | HR 72 | Resp 20

## 2018-01-28 DIAGNOSIS — K200 Eosinophilic esophagitis: Secondary | ICD-10-CM

## 2018-01-28 DIAGNOSIS — J3089 Other allergic rhinitis: Secondary | ICD-10-CM | POA: Diagnosis not present

## 2018-01-28 DIAGNOSIS — K219 Gastro-esophageal reflux disease without esophagitis: Secondary | ICD-10-CM

## 2018-01-28 MED ORDER — MONTELUKAST SODIUM 10 MG PO TABS: ORAL_TABLET | ORAL | 11 refills | Status: DC

## 2018-01-28 NOTE — Patient Instructions (Addendum)
  1. Continue to avoid all dairy consumption  2. Continue to Treat inflammation:   A. OTC Rhinocort one spray each nostril once a day  B. montelukast 10 mg tablet 1 time per day  3. Continue to Treat reflux:   A. Nexium 40 mg tablet in PM  B. continue off all forms of caffeine and chocolate  4. Can add OTC antihistamine - loratadine/fexofenadine/cetirizine   5. Return in 1 year or earlier if problem

## 2018-01-28 NOTE — Progress Notes (Signed)
Follow-up Note  Referring Provider: Nicoletta Dress, MD Primary Provider: Nicoletta Dress, MD Date of Office Visit: 01/28/2018  Subjective:   Sherri Ruiz (DOB: 1961-02-08) is a 57 y.o. female who returns to the [facility] [place] on 01/28/2018 in re-evaluation of the following:  HPI: Sherri Ruiz returns to this clinic in reevaluation of her eosinophilic esophagitis and allergic rhinitis and LPR.  Her last visit to this clinic was 26 Mar 2017.  Overall she has really been doing well with her eosinophilic esophagitis and she has had no significant swallowing issues.  She continues to use Nexium on a regular basis and she remains away from all dairy consumption and as well does not eat potatoes.  She has basically done very well with this condition for a few years.  She had a upper endoscopy performed on 1 March by Dr. Lyndel Safe which identified no eosinophilic esophagitis at this point.  She has been doing very well with her upper airways and it does not sound as though she has required an antibiotic or systemic steroid to treat any type of respiratory tract issue but over the course of the past 2 weeks she has had some more sneezing and nasal congestion and a little postnasal drip and throat clearing.  She restarted her Nasacort and her nose is a little bit better yet she still has some pressure.  She would like to restart her montelukast.  Allergies as of 01/28/2018      Reactions   Clindamycin/lincomycin    Meperidine Other (See Comments)   unknown   Penicillin G Other (See Comments)   Unknown      Medication List      atorvastatin 20 MG tablet Commonly known as:  LIPITOR TAKE 1 TABLET BY MOUTH EVERYDAY AT BEDTIME   CALCIUM PO Take by mouth daily.   DIGESTIVE ADVANTAGE PO Take by mouth daily.   levothyroxine 50 MCG tablet Commonly known as:  SYNTHROID, LEVOTHROID Take 50 mcg by mouth daily before breakfast.   lisinopril 5 MG tablet Commonly known as:   PRINIVIL,ZESTRIL Take 5 mg by mouth daily.   MINIVELLE 0.1 MG/24HR patch Generic drug:  estradiol   montelukast 10 MG tablet Commonly known as:  SINGULAIR Take one tablet by mouth once daily as directed.   MULTIVITAMIN PO Take by mouth daily.   NASACORT ALLERGY 24HR 55 MCG/ACT Aero nasal inhaler Generic drug:  triamcinolone Place 1 spray into the nose daily.   NEXIUM 40 MG capsule Generic drug:  esomeprazole Take one capsule daily as directed   VITAMIN C PO Take by mouth daily.   ZYRTEC PO Take by mouth.       Past Medical History:  Diagnosis Date  . Allergic rhinitis   . Eosinophilic esophagitis   . High blood pressure   . Hypothyroidism   . LPRD (laryngopharyngeal reflux disease)   . Penicillin allergy     Past Surgical History:  Procedure Laterality Date  . CHOLECYSTECTOMY    . ESOPHAGUS SURGERY    . OTHER SURGICAL HISTORY     Hysterectomy    Review of systems negative except as noted in HPI / PMHx or noted below:  Review of Systems  Constitutional: Negative.   HENT: Negative.   Eyes: Negative.   Respiratory: Negative.   Cardiovascular: Negative.   Gastrointestinal: Negative.   Genitourinary: Negative.   Musculoskeletal: Negative.   Skin: Negative.   Neurological: Negative.   Endo/Heme/Allergies: Negative.   Psychiatric/Behavioral: Negative.  Objective:   Vitals:   01/28/18 1653  BP: 130/80  Pulse: 72  Resp: 20          Physical Exam  Constitutional: She is well-developed, well-nourished, and in no distress.  HENT:  Head: Normocephalic.  Right Ear: Tympanic membrane, external ear and ear canal normal.  Left Ear: Tympanic membrane, external ear and ear canal normal.  Nose: Nose normal. No mucosal edema or rhinorrhea.  Mouth/Throat: Uvula is midline, oropharynx is clear and moist and mucous membranes are normal. No oropharyngeal exudate.  Eyes: Conjunctivae are normal.  Neck: Trachea normal. No tracheal tenderness present. No  tracheal deviation present. No thyromegaly present.  Cardiovascular: Normal rate, regular rhythm, S1 normal, S2 normal and normal heart sounds.  No murmur heard. Pulmonary/Chest: Breath sounds normal. No stridor. No respiratory distress. She has no wheezes. She has no rales.  Musculoskeletal: She exhibits no edema.  Lymphadenopathy:       Head (right side): No tonsillar adenopathy present.       Head (left side): No tonsillar adenopathy present.    She has no cervical adenopathy.  Neurological: She is alert. Gait normal.  Skin: No rash noted. She is not diaphoretic. No erythema. Nails show no clubbing.  Psychiatric: Mood and affect normal.    Diagnostics: none   Assessment and Plan:   1. Eosinophilic esophagitis   2. Other allergic rhinitis   3. LPRD (laryngopharyngeal reflux disease)     1. Continue to avoid all dairy consumption  2. Continue to Treat inflammation:   A. OTC Rhinocort one spray each nostril once a day  B. montelukast 10 mg tablet 1 time per day  3. Continue to Treat reflux:   A. Nexium 40 mg tablet in PM  B. continue off all forms of caffeine and chocolate  4. Can add OTC antihistamine - loratadine/fexofenadine/cetirizine   5. Return in 1 year or earlier if problem  Sherri Ruiz appears to be doing very well on her current therapy and I did make the recommendation that she consider remaining away from dairy as this dietary manipulation appears to have resulted in very good control of her eosinophilic esophagitis and there has not been redevelopment of this issue while using this diet.  For her upper airway issues she can continue to use a nasal steroid and a leukotriene modifier.  Her LPR has basically resolved and there may be an opportunity for her to consolidate her proton pump inhibitor use and she has the option of discontinuing this agent to see what happens without any therapy directed against reflux.  I will see her back in this clinic in a year or earlier if  there is a problem.  Sherri Katz, MD Allergy / Immunology [place]

## 2018-02-01 ENCOUNTER — Encounter: Payer: Self-pay | Admitting: Allergy and Immunology

## 2018-02-09 DIAGNOSIS — G44209 Tension-type headache, unspecified, not intractable: Secondary | ICD-10-CM | POA: Diagnosis not present

## 2018-02-09 DIAGNOSIS — R0789 Other chest pain: Secondary | ICD-10-CM | POA: Diagnosis not present

## 2018-02-09 DIAGNOSIS — R51 Headache: Secondary | ICD-10-CM | POA: Diagnosis not present

## 2018-02-09 DIAGNOSIS — Z6821 Body mass index (BMI) 21.0-21.9, adult: Secondary | ICD-10-CM | POA: Diagnosis not present

## 2018-03-02 DIAGNOSIS — F411 Generalized anxiety disorder: Secondary | ICD-10-CM | POA: Diagnosis not present

## 2018-03-30 DIAGNOSIS — K580 Irritable bowel syndrome with diarrhea: Secondary | ICD-10-CM | POA: Diagnosis not present

## 2018-03-30 DIAGNOSIS — G44209 Tension-type headache, unspecified, not intractable: Secondary | ICD-10-CM | POA: Diagnosis not present

## 2018-03-31 DIAGNOSIS — F411 Generalized anxiety disorder: Secondary | ICD-10-CM | POA: Diagnosis not present

## 2018-04-13 DIAGNOSIS — F411 Generalized anxiety disorder: Secondary | ICD-10-CM | POA: Diagnosis not present

## 2018-04-22 DIAGNOSIS — K080 Exfoliation of teeth due to systemic causes: Secondary | ICD-10-CM | POA: Diagnosis not present

## 2018-04-26 DIAGNOSIS — K080 Exfoliation of teeth due to systemic causes: Secondary | ICD-10-CM | POA: Diagnosis not present

## 2018-05-01 DIAGNOSIS — J208 Acute bronchitis due to other specified organisms: Secondary | ICD-10-CM | POA: Diagnosis not present

## 2018-05-10 DIAGNOSIS — J019 Acute sinusitis, unspecified: Secondary | ICD-10-CM | POA: Diagnosis not present

## 2018-05-10 DIAGNOSIS — J208 Acute bronchitis due to other specified organisms: Secondary | ICD-10-CM | POA: Diagnosis not present

## 2018-05-25 DIAGNOSIS — F411 Generalized anxiety disorder: Secondary | ICD-10-CM | POA: Diagnosis not present

## 2018-07-20 ENCOUNTER — Emergency Department (HOSPITAL_COMMUNITY): Payer: Federal, State, Local not specified - PPO

## 2018-07-20 ENCOUNTER — Encounter (HOSPITAL_COMMUNITY): Payer: Self-pay

## 2018-07-20 ENCOUNTER — Observation Stay (HOSPITAL_COMMUNITY)
Admission: EM | Admit: 2018-07-20 | Discharge: 2018-07-21 | Disposition: A | Discharge status: Home / Self Care | Payer: Federal, State, Local not specified - PPO | Attending: Internal Medicine | Admitting: Internal Medicine

## 2018-07-20 ENCOUNTER — Other Ambulatory Visit: Payer: Self-pay

## 2018-07-20 DIAGNOSIS — J309 Allergic rhinitis, unspecified: Secondary | ICD-10-CM | POA: Diagnosis not present

## 2018-07-20 DIAGNOSIS — R51 Headache: Secondary | ICD-10-CM | POA: Diagnosis not present

## 2018-07-20 DIAGNOSIS — Z881 Allergy status to other antibiotic agents status: Secondary | ICD-10-CM | POA: Diagnosis not present

## 2018-07-20 DIAGNOSIS — K219 Gastro-esophageal reflux disease without esophagitis: Secondary | ICD-10-CM | POA: Diagnosis not present

## 2018-07-20 DIAGNOSIS — Z79899 Other long term (current) drug therapy: Secondary | ICD-10-CM | POA: Insufficient documentation

## 2018-07-20 DIAGNOSIS — I10 Essential (primary) hypertension: Secondary | ICD-10-CM | POA: Diagnosis not present

## 2018-07-20 DIAGNOSIS — E785 Hyperlipidemia, unspecified: Secondary | ICD-10-CM | POA: Diagnosis present

## 2018-07-20 DIAGNOSIS — Z888 Allergy status to other drugs, medicaments and biological substances status: Secondary | ICD-10-CM | POA: Insufficient documentation

## 2018-07-20 DIAGNOSIS — R079 Chest pain, unspecified: Secondary | ICD-10-CM | POA: Diagnosis present

## 2018-07-20 DIAGNOSIS — R519 Headache, unspecified: Secondary | ICD-10-CM | POA: Insufficient documentation

## 2018-07-20 DIAGNOSIS — E7849 Other hyperlipidemia: Secondary | ICD-10-CM | POA: Diagnosis not present

## 2018-07-20 DIAGNOSIS — Z8249 Family history of ischemic heart disease and other diseases of the circulatory system: Secondary | ICD-10-CM | POA: Insufficient documentation

## 2018-07-20 DIAGNOSIS — Z809 Family history of malignant neoplasm, unspecified: Secondary | ICD-10-CM | POA: Diagnosis not present

## 2018-07-20 DIAGNOSIS — Z9049 Acquired absence of other specified parts of digestive tract: Secondary | ICD-10-CM | POA: Diagnosis not present

## 2018-07-20 DIAGNOSIS — E7800 Pure hypercholesterolemia, unspecified: Secondary | ICD-10-CM | POA: Diagnosis not present

## 2018-07-20 DIAGNOSIS — R0789 Other chest pain: Principal | ICD-10-CM | POA: Insufficient documentation

## 2018-07-20 DIAGNOSIS — Z832 Family history of diseases of the blood and blood-forming organs and certain disorders involving the immune mechanism: Secondary | ICD-10-CM | POA: Insufficient documentation

## 2018-07-20 DIAGNOSIS — Z87891 Personal history of nicotine dependence: Secondary | ICD-10-CM | POA: Diagnosis not present

## 2018-07-20 DIAGNOSIS — K224 Dyskinesia of esophagus: Secondary | ICD-10-CM | POA: Diagnosis present

## 2018-07-20 DIAGNOSIS — Z825 Family history of asthma and other chronic lower respiratory diseases: Secondary | ICD-10-CM | POA: Insufficient documentation

## 2018-07-20 DIAGNOSIS — Z885 Allergy status to narcotic agent status: Secondary | ICD-10-CM | POA: Diagnosis not present

## 2018-07-20 DIAGNOSIS — Z880 Allergy status to penicillin: Secondary | ICD-10-CM | POA: Insufficient documentation

## 2018-07-20 DIAGNOSIS — Z90710 Acquired absence of both cervix and uterus: Secondary | ICD-10-CM | POA: Insufficient documentation

## 2018-07-20 DIAGNOSIS — E039 Hypothyroidism, unspecified: Secondary | ICD-10-CM | POA: Diagnosis not present

## 2018-07-20 HISTORY — DX: Pure hypercholesterolemia, unspecified: E78.00

## 2018-07-20 LAB — CBC
HCT: 39.8 % (ref 36.0–46.0)
HEMATOCRIT: 43.6 % (ref 36.0–46.0)
Hemoglobin: 15.2 g/dL — ABNORMAL HIGH (ref 12.0–15.0)
Hemoglobin: 13.8 g/dL (ref 12.0–15.0)
MCH: 32.3 pg (ref 26.0–34.0)
MCH: 32 pg (ref 26.0–34.0)
MCHC: 34.9 g/dL (ref 30.0–36.0)
MCHC: 34.7 g/dL (ref 30.0–36.0)
MCV: 92.6 fL (ref 78.0–100.0)
MCV: 92.3 fL (ref 78.0–100.0)
PLATELETS: 239 10*3/uL (ref 150–400)
PLATELETS: 219 10*3/uL (ref 150–400)
RBC: 4.71 MIL/uL (ref 3.87–5.11)
RBC: 4.31 MIL/uL (ref 3.87–5.11)
RDW: 12.8 % (ref 11.5–15.5)
RDW: 12.7 % (ref 11.5–15.5)
WBC: 5.3 10*3/uL (ref 4.0–10.5)
WBC: 4.8 10*3/uL (ref 4.0–10.5)

## 2018-07-20 LAB — BASIC METABOLIC PANEL
ANION GAP: 6 (ref 5–15)
BUN: 7 mg/dL (ref 6–20)
CHLORIDE: 103 mmol/L (ref 98–111)
CO2: 32 mmol/L (ref 22–32)
Calcium: 9.3 mg/dL (ref 8.9–10.3)
Creatinine, Ser: 0.79 mg/dL (ref 0.44–1.00)
GFR calc Af Amer: >60 mL/min (ref >60)
Glucose, Bld: 99 mg/dL (ref 70–99)
POTASSIUM: 3.8 mmol/L (ref 3.5–5.1)
SODIUM: 141 mmol/L (ref 135–145)

## 2018-07-20 LAB — CREATININE, SERUM
Creatinine, Ser: 0.81 mg/dL (ref 0.44–1.00)
GFR calc Af Amer: >60 mL/min (ref >60)
GFR calc non Af Amer: >60 mL/min (ref >60)

## 2018-07-20 LAB — TROPONIN I: Troponin I: <0.03 ng/mL (ref <0.03)

## 2018-07-20 LAB — POCT I-STAT TROPONIN I: TROPONIN I, POC: 0.04 ng/mL (ref 0.00–0.08)

## 2018-07-20 MED ORDER — ACETAMINOPHEN 160 MG/5ML PO SOLN
650.0000 mg | Freq: Once | ORAL | Status: AC
  Administered 2018-07-20: 650 mg via ORAL
  Filled 2018-07-20: qty 20.3

## 2018-07-20 MED ORDER — LEVOTHYROXINE SODIUM 50 MCG PO TABS
50.0000 ug | ORAL_TABLET | Freq: Every day | ORAL | Status: DC
  Administered 2018-07-21: 50 ug via ORAL
  Filled 2018-07-20 (×2): qty 1

## 2018-07-20 MED ORDER — ALPRAZOLAM 0.25 MG PO TABS: 0.2500 mg | ORAL_TABLET | Freq: Two times a day (BID) | ORAL | Status: DC | PRN

## 2018-07-20 MED ORDER — ACETAMINOPHEN 325 MG PO TABS: 650.0000 mg | ORAL_TABLET | Freq: Four times a day (QID) | ORAL | Status: DC | PRN

## 2018-07-20 MED ORDER — ASPIRIN 81 MG PO CHEW
324.0000 mg | CHEWABLE_TABLET | Freq: Once | ORAL | Status: AC
  Administered 2018-07-20: 324 mg via ORAL
  Filled 2018-07-20: qty 4

## 2018-07-20 MED ORDER — ONDANSETRON HCL 4 MG/2ML IJ SOLN: 4.0000 mg | Freq: Four times a day (QID) | INTRAMUSCULAR | Status: DC | PRN

## 2018-07-20 MED ORDER — ENOXAPARIN SODIUM 40 MG/0.4ML ~~LOC~~ SOLN
40.0000 mg | SUBCUTANEOUS | Status: DC
  Filled 2018-07-20: qty 0.4

## 2018-07-20 MED ORDER — ONDANSETRON HCL 4 MG PO TABS: 4.0000 mg | ORAL_TABLET | Freq: Four times a day (QID) | ORAL | Status: DC | PRN

## 2018-07-20 MED ORDER — ACETAMINOPHEN 160 MG/5ML PO SOLN: 325.0000 mg | Freq: Once | ORAL | Status: DC

## 2018-07-20 MED ORDER — METOCLOPRAMIDE HCL 5 MG/ML IJ SOLN
10.0000 mg | Freq: Once | INTRAMUSCULAR | Status: AC
  Administered 2018-07-20: 10 mg via INTRAVENOUS
  Filled 2018-07-20: qty 2

## 2018-07-20 MED ORDER — ONDANSETRON HCL 4 MG/2ML IJ SOLN
4.0000 mg | Freq: Once | INTRAMUSCULAR | Status: AC
  Administered 2018-07-20: 4 mg via INTRAVENOUS
  Filled 2018-07-20: qty 2

## 2018-07-20 MED ORDER — LISINOPRIL 5 MG PO TABS
5.0000 mg | ORAL_TABLET | Freq: Every day | ORAL | Status: DC
  Administered 2018-07-20: 5 mg via ORAL
  Filled 2018-07-20 (×2): qty 1

## 2018-07-20 MED ORDER — ATORVASTATIN CALCIUM 20 MG PO TABS
20.0000 mg | ORAL_TABLET | Freq: Every day | ORAL | Status: DC
  Administered 2018-07-20: 20 mg via ORAL
  Filled 2018-07-20: qty 1

## 2018-07-20 MED ORDER — KETOROLAC TROMETHAMINE 15 MG/ML IJ SOLN
15.0000 mg | Freq: Once | INTRAMUSCULAR | Status: AC
  Administered 2018-07-20: 15 mg via INTRAVENOUS
  Filled 2018-07-20: qty 1

## 2018-07-20 MED ORDER — DIPHENHYDRAMINE HCL 50 MG/ML IJ SOLN
25.0000 mg | Freq: Once | INTRAMUSCULAR | Status: AC
  Administered 2018-07-20: 25 mg via INTRAVENOUS
  Filled 2018-07-20: qty 1

## 2018-07-20 MED ORDER — NITROGLYCERIN 0.4 MG SL SUBL
0.4000 mg | SUBLINGUAL_TABLET | SUBLINGUAL | Status: DC | PRN
  Administered 2018-07-20: 0.4 mg via SUBLINGUAL
  Filled 2018-07-20: qty 1

## 2018-07-20 MED ORDER — ACETAMINOPHEN 325 MG PO TABS
650.0000 mg | ORAL_TABLET | Freq: Once | ORAL | Status: DC
  Filled 2018-07-20: qty 2

## 2018-07-20 MED ORDER — ASPIRIN EC 81 MG PO TBEC
81.0000 mg | DELAYED_RELEASE_TABLET | Freq: Every day | ORAL | Status: DC
  Filled 2018-07-20: qty 1

## 2018-07-20 MED ORDER — GI COCKTAIL ~~LOC~~
30.0000 mL | Freq: Once | ORAL | Status: AC
  Administered 2018-07-20: 30 mL via ORAL
  Filled 2018-07-20: qty 30

## 2018-07-20 MED ORDER — ACETAMINOPHEN 650 MG RE SUPP: 650.0000 mg | Freq: Four times a day (QID) | RECTAL | Status: DC | PRN

## 2018-07-20 MED ORDER — BISMUTH SUBSALICYLATE 262 MG PO CHEW
262.0000 mg | CHEWABLE_TABLET | ORAL | Status: DC | PRN
  Filled 2018-07-20: qty 1

## 2018-07-20 MED ORDER — PANTOPRAZOLE SODIUM 40 MG PO TBEC
40.0000 mg | DELAYED_RELEASE_TABLET | Freq: Every day | ORAL | Status: DC
  Filled 2018-07-20 (×2): qty 1

## 2018-07-20 MED ORDER — NITROGLYCERIN 0.4 MG SL SUBL: 0.4000 mg | SUBLINGUAL_TABLET | SUBLINGUAL | Status: DC | PRN

## 2018-07-20 NOTE — Progress Notes (Signed)
Pt took Nexium from home at 1700. RN held [employer]. Pt refused Lovenox. RN educated pt on importance of Lovenox injection and on not taken medications from home while hospitalized.

## 2018-07-20 NOTE — ED Notes (Signed)
ED TO INPATIENT HANDOFF REPORT  Name/Age/Gender Sherri Ruiz 57 y.o. female  Code Status   Home/SNF/Other Home  Chief Complaint chest pain   Level of Care/Admitting Diagnosis ED Disposition    ED Disposition Condition [facility] Area: [place] [[id number]]  Level of Care: Telemetry [5]  Admit to tele based on following criteria: Other see comments  Comments: chest pain r/o ACS  Diagnosis: Chest pain at rest [300762]  Admitting Physician: Louellen Molder [phone]  Attending Physician: Louellen Molder [[id number]  PT Class (Do Not Modify): Observation [104]  PT Acc Code (Do Not Modify): Observation [10022]       Medical History Past Medical History:  Diagnosis Date  . Allergic rhinitis   . Eosinophilic esophagitis   . High blood pressure   . High cholesterol   . Hypothyroidism   . LPRD (laryngopharyngeal reflux disease)   . Penicillin allergy     Allergies Allergies  Allergen Reactions  . Clindamycin/Lincomycin Swelling  . Meperidine Nausea And Vomiting    unknown  . Penicillin G Other (See Comments)    Unknown/childhood allergy Has patient had a PCN reaction causing immediate rash, facial/tongue/throat swelling, SOB or lightheadedness with hypotension: Yes Has patient had a PCN reaction causing severe rash involving mucus membranes or skin necrosis: Unknown Has patient had a PCN reaction that required hospitalization: No Has patient had a PCN reaction occurring within the last 10 years: Unknown If all of the above answers are "NO", then may proceed with Cephalosporin use.      IV Location/Drains/Wounds Patient Lines/Drains/Airways Status   Active Line/Drains/Airways    Name:   Placement date:   Placement time:   Site:   Days:   Peripheral IV 07/20/18 Left Antecubital   07/20/18    1220    Antecubital   less than 1          Labs/Imaging Results for orders placed or performed during the hospital encounter of 07/20/18  (from the past 48 hour(s))  Basic metabolic panel     Status: None   Collection Time: 07/20/18 12:19 PM  Result Value Ref Range   Sodium 141 135 - 145 mmol/L   Potassium 3.8 3.5 - 5.1 mmol/L   Chloride 103 98 - 111 mmol/L   CO2 32 22 - 32 mmol/L   Glucose, Bld 99 70 - 99 mg/dL   BUN 7 6 - 20 mg/dL   Creatinine, Ser 0.79 0.44 - 1.00 mg/dL   Calcium 9.3 8.9 - 10.3 mg/dL   GFR calc non Af Amer >60 >60 mL/min   GFR calc Af Amer >60 >60 mL/min    Comment: (NOTE) The eGFR has been calculated using the CKD EPI equation. This calculation has not been validated in all clinical situations. eGFR's persistently <60 mL/min signify possible Chronic Kidney Disease.    Anion gap 6 5 - 15    Comment: Performed at [facility], [place] [address], [place], [place] [address]  CBC     Status: Abnormal   Collection Time: 07/20/18 12:19 PM  Result Value Ref Range   WBC 5.3 4.0 - 10.5 K/uL   RBC 4.71 3.87 - 5.11 MIL/uL   Hemoglobin 15.2 (H) 12.0 - 15.0 g/dL   HCT 43.6 36.0 - 46.0 %   MCV 92.6 78.0 - 100.0 fL   MCH 32.3 26.0 - 34.0 pg   MCHC 34.9 30.0 - 36.0 g/dL   RDW 12.8 11.5 - 15.5 %  Platelets 239 150 - 400 K/uL    Comment: Performed at [facility], [place] [address], [place], [place] [address]  POCT i-Stat troponin I     Status: None   Collection Time: 07/20/18 12:43 PM  Result Value Ref Range   Troponin i, poc 0.04 0.00 - 0.08 ng/mL   Comment 3            Comment: Due to the release kinetics of cTnI, a negative result within the first hours of the onset of symptoms does not rule out myocardial infarction with certainty. If myocardial infarction is still suspected, repeat the test at appropriate intervals.    Dg Chest 2 View  Result Date: 07/20/2018 CLINICAL DATA:  Chest pain. EXAM: CHEST - 2 VIEW COMPARISON:  None. FINDINGS: The heart size and mediastinal contours are within normal limits. Both lungs are clear. The visualized skeletal structures are  unremarkable. IMPRESSION: Normal chest. Electronically Signed   By: Lorriane Shire M.D.   On: 07/20/2018 11:33    Pending Labs [employer] (From admission, onward)    Start     Ordered   Signed and Held  HIV antibody (Routine Testing)  Once,   R     Signed and Held   Signed and Held  CBC  (enoxaparin (LOVENOX)    CrCl >/= 30 ml/min)  Once,   R    Comments:  Baseline for enoxaparin therapy IF NOT ALREADY DRAWN.  Notify MD if PLT < 100 K.    Signed and Held   Signed and Held  Creatinine, serum  (enoxaparin (LOVENOX)    CrCl >/= 30 ml/min)  Once,   R    Comments:  Baseline for enoxaparin therapy IF NOT ALREADY DRAWN.    Signed and Held   Signed and Held  Creatinine, serum  (enoxaparin (LOVENOX)    CrCl >/= 30 ml/min)  Weekly,   R    Comments:  while on enoxaparin therapy    Signed and Held   Signed and Held  Troponin I  Now then every 6 hours,   R     Signed and Held          Vitals/Pain Today's Vitals   07/20/18 1215 07/20/18 1221 07/20/18 1300 07/20/18 1321  BP:   118/81   Pulse: 61  76   Resp:      Temp:      TempSrc:      SpO2: 99%  98%   Weight:      Height:      PainSc:  7   5     Isolation Precautions No active isolations  Medications Medications  nitroGLYCERIN (NITROSTAT) SL tablet 0.4 mg (0.4 mg Sublingual Given 07/20/18 1254)  aspirin chewable tablet 324 mg (324 mg Oral Given 07/20/18 1257)  ondansetron (ZOFRAN) injection 4 mg (4 mg Intravenous Given 07/20/18 1321)  acetaminophen (TYLENOL) solution 650 mg (650 mg Oral Given 07/20/18 1350)  metoCLOPramide (REGLAN) injection 10 mg (10 mg Intravenous Given 07/20/18 1423)  diphenhydrAMINE (BENADRYL) injection 25 mg (25 mg Intravenous Given 07/20/18 1423)  gi cocktail (Maalox,Lidocaine,Donnatal) (30 mLs Oral Given 07/20/18 1515)  ketorolac (TORADOL) 15 MG/ML injection 15 mg (15 mg Intravenous Given 07/20/18 1515)    Mobility non-ambulatory

## 2018-07-20 NOTE — ED Triage Notes (Signed)
Patient c/o intermittent mid chest pain, nausea and dry heaves since 0800 today. Patient states she took gas medicine with no relief, took Pepto with no relief, and then took xanax. Patient states it made her tired and then continued to have mid chest pain and then a headache afterwards. Patient denies SOB.

## 2018-07-20 NOTE — ED Notes (Signed)
Pt is alert and oriented   and verbally responsive. Pt  Has dtr and husband at bedside. PT reports centralzied chest pain pressure 7/10 and HA pain 10/10 throbbing. Pt report onset occurred this AM and pt has some associated nausea and reports dry heaving today.

## 2018-07-20 NOTE — Consult Note (Signed)
CARDIOLOGY CONSULT NOTE  Patient ID: Sherri Ruiz MRN: [id number] DOB/AGE: Feb 28, 1961 57 y.o.  Admit date: 07/20/2018 Primary Physician Nicoletta Dress, MD Primary Cardiologist None Chief Complaint  Chest pain Requesting   Dr. Clementeen Graham  HPI:   The patient has no past cardiac history.  Today she was at rest.  She began to develop some lower chest discomfort that was like a squeezing sharp discomfort.  It became more severe and was slightly under her left breast.  She has not had this before despite having multiple problems with her esophagus.  She tried taking some gas relieving medication over-the-counter and then Pepto-Bismol.  She had no improvement.  Her husband thought it was a panic attack so she took a Xanax.  She still had no improvement.  She called her primary care doctor and was told to come to the emergency room.  By the time she got here her symptoms were severe and it had been over an hour.  She did not have radiation to her jaw or to her arms.  She was nauseated.  She did not vomit.  She did not have excessive diaphoresis.  She did not have extreme shortness of breath.  In the emergency room she was given Toradol, Reglan, nitroglycerin, GI cocktail, Tylenol.  She has eventually had resolution of her discomfort although she still has a vague sensation in her chest.  She is otherwise active and she exercises working out on a treadmill.  She has not been able to bring on any symptoms.  She denies any cardiovascular history and has not had any prior testing.  There is a vague history of her father having heart attack in his 60s but he is still alive in his 76s.  Is not had stenting or bypass that she can recall.  She otherwise does not have significant risk factors with some mild hypertension and dyslipidemia.   Past Medical History:  Diagnosis Date  . Allergic rhinitis   . Eosinophilic esophagitis   . High blood pressure   . High cholesterol   . Hypothyroidism   . LPRD  (laryngopharyngeal reflux disease)   . Penicillin allergy     Past Surgical History:  Procedure Laterality Date  . CHOLECYSTECTOMY    . ESOPHAGUS SURGERY    . OTHER SURGICAL HISTORY     Hysterectomy    Allergies  Allergen Reactions  . Clindamycin/Lincomycin Swelling  . Meperidine Nausea And Vomiting    unknown  . Penicillin G Other (See Comments)    Unknown/childhood allergy Has patient had a PCN reaction causing immediate rash, facial/tongue/throat swelling, SOB or lightheadedness with hypotension: Yes Has patient had a PCN reaction causing severe rash involving mucus membranes or skin necrosis: Unknown Has patient had a PCN reaction that required hospitalization: No Has patient had a PCN reaction occurring within the last 10 years: Unknown If all of the above answers are "NO", then may proceed with Cephalosporin use.     Medications Prior to Admission  Medication Sig Dispense Refill Last Dose  . ALPRAZolam (XANAX) 0.25 MG tablet Take 0.25 mg by mouth 2 (two) times daily as needed for anxiety.  0 07/20/2018 at Unknown time  . Ascorbic Acid (VITAMIN C PO) Take by mouth daily.   Past Week at Unknown time  . atorvastatin (LIPITOR) 20 MG tablet Take 20 mg by mouth at bedtime.   1 07/19/2018 at Unknown time  . bismuth subsalicylate (PEPTO BISMOL) 262 MG chewable tablet Chew 262 mg by mouth  as needed.   07/20/2018 at Unknown time  . CALCIUM PO Take by mouth daily.   07/19/2018 at Unknown time  . Camphor-Eucalyptus-Menthol (VICKS VAPORUB EX) Apply topically.   Past Week at Unknown time  . Cetirizine HCl (ZYRTEC PO) Take by mouth.   07/19/2018 at Unknown time  . Chlorphen-Pseudoephed-APAP (THERAFLU FLU/COLD PO) Take by mouth.   Past Week at Unknown time  . levothyroxine (SYNTHROID, LEVOTHROID) 50 MCG tablet Take 50 mcg by mouth daily before breakfast.    07/20/2018 at Unknown time  . lisinopril (PRINIVIL,ZESTRIL) 5 MG tablet Take 5 mg by mouth daily.    07/19/2018 at Unknown time  . MINIVELLE  0.1 MG/24HR patch    07/20/2018 at Unknown time  . montelukast (SINGULAIR) 10 MG tablet Take one tablet by mouth once daily as directed. 30 tablet 11 07/19/2018 at Unknown time  . Multiple Vitamins-Minerals (MULTIVITAMIN PO) Take by mouth daily.   07/19/2018 at Unknown time  . NEXIUM 40 MG capsule Take one capsule daily as directed 30 capsule 5 07/19/2018 at Unknown time  . Phenylephrine-DM-GG-APAP (MUCINEX CHILD MULTI-SYMPTOM PO) Take by mouth.   Past Week at Unknown time  . Probiotic Product (DIGESTIVE ADVANTAGE PO) Take by mouth daily.   Past Week at Unknown time  . Pseudoeph-Doxylamine-DM-APAP (NYQUIL PO) Take by mouth.   Past Week at Unknown time  . triamcinolone (NASACORT ALLERGY 24HR) 55 MCG/ACT AERO nasal inhaler Place 1 spray into the nose daily.   07/19/2018 at Unknown time   Family History  Problem Relation Age of Onset  . Clotting disorder Mother   . High Cholesterol Mother   . Asthma Mother   . High blood pressure Father   . Cancer Sister   . Asthma Daughter     Social History   Socioeconomic History  . Marital status: Unknown    Spouse name: Not on file  . Number of children: Not on file  . Years of education: Not on file  . Highest education level: Not on file  Occupational History  . Not on file  Social Needs  . Financial resource strain: Not on file  . Food insecurity:    Worry: Not on file    Inability: Not on file  . Transportation needs:    Medical: Not on file    Non-medical: Not on file  Tobacco Use  . Smoking status: Former Smoker    Types: Cigarettes    Last attempt to quit: 1989    Years since quitting: 30.7  . Smokeless tobacco: Never Used  Substance and Sexual Activity  . Alcohol use: Never    Frequency: Never  . Drug use: Never  . Sexual activity: Not on file  Lifestyle  . Physical activity:    Days per week: Not on file    Minutes per session: Not on file  . Stress: Not on file  Relationships  . Social connections:    Talks on phone: Not  on file    Gets together: Not on file    Attends religious service: Not on file    Active member of club or organization: Not on file    Attends meetings of clubs or organizations: Not on file    Relationship status: Not on file  . Intimate partner violence:    Fear of current or ex partner: Not on file    Emotionally abused: Not on file    Physically abused: Not on file    Forced sexual activity: Not on  file  Other Topics Concern  . Not on file  Social History Narrative  . Not on file     ROS:    As stated in the HPI and negative for all other systems.  Physical Exam: Blood pressure 135/85, pulse 65, temperature 98.7 F (37.1 C), temperature source Oral, resp. rate 16, height 5\' 2"  (1.575 m), weight 56.7 kg, last menstrual period 07/20/2018, SpO2 98 %.  GENERAL:  Well appearing HEENT:  Pupils equal round and reactive, fundi not visualized, oral mucosa unremarkable NECK:  No jugular venous distention, waveform within normal limits, carotid upstroke brisk and symmetric, no bruits, no thyromegaly LYMPHATICS:  No cervical, inguinal adenopathy LUNGS:  Clear to auscultation bilaterally BACK:  No CVA tenderness CHEST:  Unremarkable HEART:  PMI not displaced or sustained,S1 and S2 within normal limits, no S3, no S4, no clicks, no rubs, no murmurs ABD:  Flat, positive bowel sounds normal in frequency in pitch, no bruits, no rebound, no guarding, no midline pulsatile mass, no hepatomegaly, no splenomegaly EXT:  2 plus pulses throughout, no edema, no cyanosis no clubbing SKIN:  No rashes no nodules NEURO:  Cranial nerves II through XII grossly intact, motor grossly intact throughout PSYCH:  Cognitively intact, oriented to person place and time   Labs: Lab Results  Component Value Date   BUN 7 07/20/2018   Lab Results  Component Value Date   CREATININE 0.79 07/20/2018   Lab Results  Component Value Date   NA 141 07/20/2018   K 3.8 07/20/2018   CL 103 07/20/2018   CO2 32  07/20/2018   No results found for: TROPONINI Lab Results  Component Value Date   WBC 5.3 07/20/2018   HGB 15.2 (H) 07/20/2018   HCT 43.6 07/20/2018   MCV 92.6 07/20/2018   PLT 239 07/20/2018   No results found for: CHOL, HDL, LDLCALC, LDLDIRECT, TRIG, CHOLHDL No results found for: ALT, AST, GGT, ALKPHOS, BILITOT    Radiology:   CXR: Normal chest.   EKG:    Sinus rhythm, rate 61, axis within normal limits, intervals within normal limits, no acute ST-T wave changes.   ASSESSMENT AND PLAN:   CHEST PAIN: Chest pain is atypical.  Enzymes have been negative.  EKG was nonacute.  I think the pretest probability of obstructive coronary disease is low.  If her enzymes remain normal overnight I think she could be followed up in outpatient POET ([employer] Exercise Treadmill).  Given her extensive history of esophageal problems this is more likely to be GI.  HTN: Blood pressures well controlled at home but slightly elevated here.  This can be monitored and adjustments made.  DYSLIPIDEMIA: This is followed by her primary care provider.  No change in therapy.  SignedMinus Breeding 07/20/2018, 4:34 PM

## 2018-07-20 NOTE — H&P (Signed)
TRH H&P   Patient Demographics:    Sherri Ruiz, is a 57 y.o. female  MRN: [id number]   DOB - [birth year]  Admit Date - 07/20/2018  Outpatient Primary MD for the patient is Nicoletta Dress, MD  Referring MD: Dr. Regenia Skeeter  Outpatient Specialists: None  Patient coming from: Home  Chief Complaint  Patient presents with  . Chest Pain      HPI:    Sherri Ruiz  is a 57 y.o. 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  Patient reports stabbing and squeezing chest pain 10/10 in severity, occasionally radiating to the sides without any aggravating or relieving factors.  This was associated with nausea and headache with no vomiting.  Patient took some Pepto-Bismol at home thinking the pain was somewhat similar to her not crackers esophagus but extremely severe.  This did not relieve her symptoms.  Since the pain was persistent she decided to come to the ED.  Patient denies similar severe pain, reports some dizziness after coming to the ED, denies blurred vision, shortness of breath, palpitations, orthopnea, PND, abdominal pain, dysuria, diarrhea, leg swellings, tingling or numbness of the extremities.  Denies any chest trauma or lifting heavy objects. Denies prior stress test.  She reports that her father died in his 44s with MI and mother has had strokes.  Reports remote tobacco use and quit about 20 years ago.  In the ED vitals were stable, blood pressure elevated to 170/100 mmHg on my evaluation.  Blood work showed normal WBC and chemistry.  Troponin POC of 0.04.  Chest x-ray unremarkable.  EKG showed normal sinus rhythm at 61 with no ST-T changes. Patient given 324 mg aspirin, and sublingual nitrate.  Her chest pain was slightly better but complained of severe occipital  headache and nausea.  She was given IV Reglan and Zofran. Hospitalist consulted for observation on telemetry for chest pain rule out.     Review of systems:    In addition to the HPI above, (positive symptoms in bold) No Fever-chills, Frontal and occipital headache, no change in vision or hearing. No problems swallowing food or Liquids, Chest pain+++, no cough or shortness of breath No Abdominal pain, nausea +, no vomiting, Bowel movements are regular, No Blood in stool or Urine, No dysuria, No new skin rashes or bruises, No new joints pains-aches,  No new weakness, tingling, numbness in any extremity, No recent weight gain or loss, No polyuria, polydypsia or polyphagia, No significant Mental Stressors.    With Past History of the following :    Past Medical History:  Diagnosis Date  . Allergic rhinitis   . Eosinophilic esophagitis   . High blood pressure   . High cholesterol   . Hypothyroidism   . LPRD (laryngopharyngeal reflux disease)   .  Penicillin allergy       Past Surgical History:  Procedure Laterality Date  . CHOLECYSTECTOMY    . ESOPHAGUS SURGERY    . OTHER SURGICAL HISTORY     Hysterectomy      Social History:     Social History   Tobacco Use  . Smoking status: Former Smoker    Types: Cigarettes    Last attempt to quit: 1989    Years since quitting: 30.7  . Smokeless tobacco: Never Used  Substance Use Topics  . Alcohol use: Never    Frequency: Never     Lives -home with husband  Mobility -independent     Family History :     Family History  Problem Relation Age of Onset  . Clotting disorder Mother   . High Cholesterol Mother   . Asthma Mother   . High blood pressure Father   . Cancer Sister   . Asthma Daughter       Home Medications:   Prior to Admission medications   Medication Sig Start Date End Date Taking? Authorizing Provider  ALPRAZolam (XANAX) 0.25 MG tablet Take 0.25 mg by mouth 2 (two) times daily as needed for  anxiety. 05/01/18  Yes [provider]  Ascorbic Acid (VITAMIN C PO) Take by mouth daily.   Yes [provider]  atorvastatin (LIPITOR) 20 MG tablet Take 20 mg by mouth at bedtime.  12/15/17  Yes [provider]  bismuth subsalicylate (PEPTO BISMOL) 262 MG chewable tablet Chew 262 mg by mouth as needed.   Yes [provider]  CALCIUM PO Take by mouth daily.   Yes [provider]  Camphor-Eucalyptus-Menthol (VICKS VAPORUB EX) Apply topically.   Yes [provider]  Cetirizine HCl (ZYRTEC PO) Take by mouth.   Yes [provider]  Chlorphen-Pseudoephed-APAP (THERAFLU FLU/COLD PO) Take by mouth.   Yes [provider]  levothyroxine (SYNTHROID, LEVOTHROID) 50 MCG tablet Take 50 mcg by mouth daily before breakfast.  11/21/16  Yes [provider]  lisinopril (PRINIVIL,ZESTRIL) 5 MG tablet Take 5 mg by mouth daily.  10/16/16  Yes [provider]  MINIVELLE 0.1 MG/24HR patch  11/04/16  Yes [provider]  montelukast (SINGULAIR) 10 MG tablet Take one tablet by mouth once daily as directed. 01/28/18  Yes Kozlow, Donnamarie Poag, MD  Multiple Vitamins-Minerals (MULTIVITAMIN PO) Take by mouth daily.   Yes [provider]  NEXIUM 40 MG capsule Take one capsule daily as directed 03/26/17  Yes Kozlow, Donnamarie Poag, MD  Phenylephrine-DM-GG-APAP [facility] CHILD MULTI-SYMPTOM PO) Take by mouth.   Yes [provider]  Probiotic Product (DIGESTIVE ADVANTAGE PO) Take by mouth daily.   Yes [provider]  Pseudoeph-Doxylamine-DM-APAP (NYQUIL PO) Take by mouth.   Yes [provider]  triamcinolone (NASACORT ALLERGY 24HR) 55 MCG/ACT AERO nasal inhaler Place 1 spray into the nose daily.   Yes [provider]     Allergies:     Allergies  Allergen Reactions  . Clindamycin/Lincomycin Swelling  . Meperidine Nausea And Vomiting    unknown  . Penicillin G Other (See Comments)    Unknown/childhood  allergy Has patient had a PCN reaction causing immediate rash, facial/tongue/throat swelling, SOB or lightheadedness with hypotension: Yes Has patient had a PCN reaction causing severe rash involving mucus membranes or skin necrosis: Unknown Has patient had a PCN reaction that required hospitalization: No Has patient had a PCN reaction occurring within the last 10 years: Unknown If all of the  above answers are "NO", then may proceed with Cephalosporin use.       Physical Exam:   Vitals  Blood pressure 118/81, pulse 76, temperature 97.9 F (36.6 C), temperature source Oral, resp. rate 16, height 5\' 2"  (1.575 m), weight 56.7 kg, last menstrual period 07/20/2018, SpO2 98 %.    General: Middle-aged female lying in bed in no acute distress, fatigue HEENT: Pupils reactive bilaterally, EOMI, no pallor, no icterus, moist mucosa, supple neck, no cervical lymph adenopathy Chest: Reproducible pain on pressure over substernal area,  clear to auscultation bilaterally, CVS: Normal S1 and S2, no murmurs rub or gallop GI: Soft, nondistended, nontender Musculoskeletal: Warm, no edema, normal skin CNS: Alert and oriented, nonfocal   Data Review:    CBC Recent Labs  Lab 07/20/18 1219  WBC 5.3  HGB 15.2*  HCT 43.6  PLT 239  MCV 92.6  MCH 32.3  MCHC 34.9  RDW 12.8   ------------------------------------------------------------------------------------------------------------------  Chemistries  Recent Labs  Lab 07/20/18 1219  NA 141  K 3.8  CL 103  CO2 32  GLUCOSE 99  BUN 7  CREATININE 0.79  CALCIUM 9.3   ------------------------------------------------------------------------------------------------------------------ estimated creatinine clearance is 62.8 mL/min (by C-G formula based on SCr of 0.79 mg/dL). ------------------------------------------------------------------------------------------------------------------ No results for input(s): TSH, T4TOTAL, T3FREE, THYROIDAB in  the last 72 hours.  Invalid input(s): FREET3  Coagulation profile No results for input(s): INR, PROTIME in the last 168 hours. ------------------------------------------------------------------------------------------------------------------- No results for input(s): DDIMER in the last 72 hours. -------------------------------------------------------------------------------------------------------------------  Cardiac Enzymes No results for input(s): CKMB, TROPONINI, MYOGLOBIN in the last 168 hours.  Invalid input(s): CK ------------------------------------------------------------------------------------------------------------------ No results found for: BNP   ---------------------------------------------------------------------------------------------------------------  Urinalysis No results found for: COLORURINE, APPEARANCEUR, LABSPEC, [place], GLUCOSEU, HGBUR, BILIRUBINUR, KETONESUR, PROTEINUR, UROBILINOGEN, NITRITE, LEUKOCYTESUR  ----------------------------------------------------------------------------------------------------------------   Imaging Results:    Dg Chest 2 View  Result Date: 07/20/2018 CLINICAL DATA:  Chest pain. EXAM: CHEST - 2 VIEW COMPARISON:  None. FINDINGS: The heart size and mediastinal contours are within normal limits. Both lungs are clear. The visualized skeletal structures are unremarkable. IMPRESSION: Normal chest. Electronically Signed   By: Lorriane Shire M.D.   On: 07/20/2018 11:33    My personal review of EKG: Normal sinus rhythm at 61, no ST-T changes   Assessment & Plan:    Active Problems:   Chest pain at rest Patient has both typical and atypical symptoms.  Pain has reproducible component and she does have chronic GI symptoms which could be contributing.  However the nature of pain is different for her and more intense.  Her heart score is 4.  Initial work-up is negative.  Will place her on observation on telemetry to rule out for ACS.   Cycle serial troponins and EKG. Placed on baby aspirin, will nitrate PRN.  Add sublingual nitrate PRN for chest pain.  Add low-dose beta-blocker.  Resume her home dose lisinopril and statin.  Ordered a dose of Toradol and GI cocktail. Cardiology consulted.  If serial enzymes negative will benefit from stress test in a.m.  Keep n.p.o. after midnight.     Essential hypertension Pressure elevated due to headache and nausea.  Resumed home dose amlodipine.    Hyperlipidemia Continue statin.    Hypothyroidism Resume Synthroid    Nutcracker esophagus Resume PPI.  Ordered GI cocktail.  Add PRN Maalox.   Husband and daughter in the room.  Husband was unhappy that she was going to be placed on observation overnight and worked up.  Insisted that  she be discharged home and get stress test as outpatient.  I told the patient, husband and daughter although this chest pain symptoms with suspicion for ACS is not very high she still needs to be ruled out on the monitor overnight with serial enzymes, have cardiology evaluation and possibly need a stress test tomorrow.  I asked the patient what she wanted to do and she agreed to stay overnight and get the work-up done to be sure that her symptoms were not cardiac.  Husband was unhappy and walked out of the room.  DVT Prophylaxis: Subcu Lovenox  AM Labs Ordered, also please review Full Orders  Family Communication: Admission, patients condition and plan of care including tests being ordered have been discussed with the patient, husband and daughter at bedside.  Code Status full code  DC home tomorrow if stress test negative.  Condition: Fair  Consults called: Cardiology  Admission status: Observation  Time spent in minutes : 45   Vinetta Brach M.D on 07/20/2018 at 2:32 PM  Between 7am to 7pm - Pager - [phone]. After 7pm go to [email or web address] - password [facility]  [facility] - Office  [phone]

## 2018-07-20 NOTE — ED Provider Notes (Signed)
[facility] DEPT Provider Note   CSN: [id number] Arrival date & time: 07/20/18  1018     History   Chief Complaint Chief Complaint  Patient presents with  . Chest Pain    HPI Sherri Ruiz is a 57 y.o. female.  HPI  57 year old female presents with chest pain.  Started around 8:00 this morning.  Patient states is sharp and a pressure.  The first took off her bra thinking it was too tight and tried some allergy medicine.  Later tried some Pepto-Bismol.  Shortly after this developed an occipital headache as well.  The chest pain is a little bit better but essentially unchanged.  She is been having chills as well as nausea and dry heaving.  No abdominal or back pain.  Has had a history of what sounds like an esophageal spasm before but states never had the chills or vomiting with it.  Pain is about a 7 out of 10. History of HTN, HLD, and her dad had an MI in his late 65s.  Past Medical History:  Diagnosis Date  . Allergic rhinitis   . Eosinophilic esophagitis   . High blood pressure   . High cholesterol   . Hypothyroidism   . LPRD (laryngopharyngeal reflux disease)   . Penicillin allergy     Patient Active Problem List   Diagnosis Date Noted  . Chest pain at rest 07/20/2018    Past Surgical History:  Procedure Laterality Date  . CHOLECYSTECTOMY    . ESOPHAGUS SURGERY    . OTHER SURGICAL HISTORY     Hysterectomy     OB History   None      Home Medications    Prior to Admission medications   Medication Sig Start Date End Date Taking? Authorizing Provider  ALPRAZolam (XANAX) 0.25 MG tablet Take 0.25 mg by mouth 2 (two) times daily as needed for anxiety. 05/01/18  Yes [provider]  Ascorbic Acid (VITAMIN C PO) Take by mouth daily.   Yes [provider]  atorvastatin (LIPITOR) 20 MG tablet Take 20 mg by mouth at bedtime.  12/15/17  Yes [provider]  bismuth subsalicylate (PEPTO BISMOL) 262 MG chewable tablet  Chew 262 mg by mouth as needed.   Yes [provider]  CALCIUM PO Take by mouth daily.   Yes [provider]  Camphor-Eucalyptus-Menthol (VICKS VAPORUB EX) Apply topically.   Yes [provider]  Cetirizine HCl (ZYRTEC PO) Take by mouth.   Yes [provider]  Chlorphen-Pseudoephed-APAP (THERAFLU FLU/COLD PO) Take by mouth.   Yes [provider]  levothyroxine (SYNTHROID, LEVOTHROID) 50 MCG tablet Take 50 mcg by mouth daily before breakfast.  11/21/16  Yes [provider]  lisinopril (PRINIVIL,ZESTRIL) 5 MG tablet Take 5 mg by mouth daily.  10/16/16  Yes [provider]  MINIVELLE 0.1 MG/24HR patch  11/04/16  Yes [provider]  montelukast (SINGULAIR) 10 MG tablet Take one tablet by mouth once daily as directed. 01/28/18  Yes Kozlow, Donnamarie Poag, MD  Multiple Vitamins-Minerals (MULTIVITAMIN PO) Take by mouth daily.   Yes [provider]  NEXIUM 40 MG capsule Take one capsule daily as directed 03/26/17  Yes Kozlow, Donnamarie Poag, MD  Phenylephrine-DM-GG-APAP [facility] CHILD MULTI-SYMPTOM PO) Take by mouth.   Yes [provider]  Probiotic Product (DIGESTIVE ADVANTAGE PO) Take by mouth daily.   Yes [provider]  Pseudoeph-Doxylamine-DM-APAP (NYQUIL PO) Take by mouth.   Yes [provider]  triamcinolone (  NASACORT ALLERGY 24HR) 55 MCG/ACT AERO nasal inhaler Place 1 spray into the nose daily.   Yes [provider]    Family History Family History  Problem Relation Age of Onset  . Clotting disorder Mother   . High Cholesterol Mother   . Asthma Mother   . High blood pressure Father   . Cancer Sister   . Asthma Daughter     Social History Social History   Tobacco Use  . Smoking status: Former Smoker    Types: Cigarettes    Last attempt to quit: 1989    Years since quitting: 30.7  . Smokeless tobacco: Never Used  Substance Use Topics  . Alcohol use: Never    Frequency: Never  . Drug  use: Never     Allergies   Clindamycin/lincomycin; Meperidine; and Penicillin g   Review of Systems Review of Systems  Constitutional: Positive for chills. Negative for diaphoresis.  Cardiovascular: Positive for chest pain.  Gastrointestinal: Positive for nausea and vomiting (dry heaving).  Musculoskeletal: Negative for back pain.  All other systems reviewed and are negative.    Physical Exam Updated Vital Signs BP 118/81   Pulse 76   Temp 97.9 F (36.6 C) (Oral)   Resp 16   Ht 5\' 2"  (1.575 m)   Wt 56.7 kg   LMP 07/20/2018   SpO2 98%   BMI 22.86 kg/m   Physical Exam  Constitutional: She is oriented to person, place, and time. She appears well-developed and well-nourished.  HENT:  Head: Normocephalic and atraumatic.  Right Ear: External ear normal.  Left Ear: External ear normal.  Nose: Nose normal.  Eyes: Right eye exhibits no discharge. Left eye exhibits no discharge.  Cardiovascular: Normal rate, regular rhythm and normal heart sounds.  Pulmonary/Chest: Effort normal and breath sounds normal. She exhibits tenderness.    Abdominal: Soft. There is no tenderness.  Musculoskeletal:       Right lower leg: She exhibits no edema.       Left lower leg: She exhibits no edema.  Neurological: She is alert and oriented to person, place, and time.  CN 3-12 grossly intact. 5/5 strength in all 4 extremities. Grossly normal sensation. Normal finger to nose.   Skin: Skin is warm and dry. She is not diaphoretic.  Nursing note and vitals reviewed.    ED Treatments / Results  Labs (all labs ordered are listed, but only abnormal results are displayed) Labs Reviewed  CBC - Abnormal; Notable for the following components:      Result Value   Hemoglobin 15.2 (*)    All other components within normal limits  BASIC METABOLIC PANEL  I-STAT TROPONIN, ED  POCT I-STAT TROPONIN I    EKG EKG Interpretation  Date/Time:  Tuesday July 20 2018 10:27:08 EDT Ventricular Rate:    71 PR Interval:    QRS Duration: 73 QT Interval:  387 QTC Calculation: 421 R Axis:   55 Text Interpretation:  Normal sinus rhythm Low voltage, extremity and precordial leads No old tracing to compare Confirmed by Sherwood Gambler [phone]) on 07/20/2018 12:16:53 PM   EKG Interpretation  Date/Time:  Tuesday July 20 2018 13:18:39 EDT Ventricular Rate:  61 PR Interval:    QRS Duration: 77 QT Interval:  427 QTC Calculation: 431 R Axis:   57 Text Interpretation:  Sinus rhythm Low voltage, extremity and precordial leads no significant change since earlier in the day Confirmed by Sherwood Gambler [phone]) on 07/20/2018 1:57:39 PM  Radiology Dg Chest 2 View  Result Date: 07/20/2018 CLINICAL DATA:  Chest pain. EXAM: CHEST - 2 VIEW COMPARISON:  None. FINDINGS: The heart size and mediastinal contours are within normal limits. Both lungs are clear. The visualized skeletal structures are unremarkable. IMPRESSION: Normal chest. Electronically Signed   By: Lorriane Shire M.D.   On: 07/20/2018 11:33    Procedures Procedures (including critical care time)  Medications Ordered in ED Medications  nitroGLYCERIN (NITROSTAT) SL tablet 0.4 mg (0.4 mg Sublingual Given 07/20/18 1254)  metoCLOPramide (REGLAN) injection 10 mg (has no administration in time range)  diphenhydrAMINE (BENADRYL) injection 25 mg (has no administration in time range)  aspirin chewable tablet 324 mg (324 mg Oral Given 07/20/18 1257)  ondansetron (ZOFRAN) injection 4 mg (4 mg Intravenous Given 07/20/18 1321)  acetaminophen (TYLENOL) solution 650 mg (650 mg Oral Given 07/20/18 1350)     Initial Impression / Assessment and Plan / ED Course  I have reviewed the triage vital signs and the nursing notes.  Pertinent labs & imaging results that were available during my care of the patient were reviewed by me and considered in my medical decision making (see chart for details).     With patient's history, esophageal spasm  certainly could be a possibility but cardiac cause.  Her heart score is a 4.  She does have improvement in her chest pain with nitroglycerin but this is also made her headache worse.  Her headache has been slowly progressive and so my suspicion of a subarachnoid hemorrhage is low.  Also highly doubt acute infectious symptoms.  Given all this, she will need observation and ACS rule out.  Hospitalist to admit.  Final Clinical Impressions(s) / ED Diagnoses   Final diagnoses:  Atypical chest pain  Occipital headache    ED Discharge Orders    None       Sherwood Gambler, MD 07/20/18 1418

## 2018-07-21 ENCOUNTER — Other Ambulatory Visit: Payer: Self-pay | Admitting: Cardiology

## 2018-07-21 DIAGNOSIS — R0789 Other chest pain: Secondary | ICD-10-CM | POA: Diagnosis not present

## 2018-07-21 DIAGNOSIS — E039 Hypothyroidism, unspecified: Secondary | ICD-10-CM

## 2018-07-21 DIAGNOSIS — I10 Essential (primary) hypertension: Secondary | ICD-10-CM

## 2018-07-21 DIAGNOSIS — R079 Chest pain, unspecified: Secondary | ICD-10-CM | POA: Diagnosis not present

## 2018-07-21 DIAGNOSIS — E7849 Other hyperlipidemia: Secondary | ICD-10-CM

## 2018-07-21 DIAGNOSIS — E785 Hyperlipidemia, unspecified: Secondary | ICD-10-CM | POA: Diagnosis not present

## 2018-07-21 LAB — TROPONIN I

## 2018-07-21 LAB — HIV ANTIBODY (ROUTINE TESTING W REFLEX): HIV SCREEN 4TH GENERATION: NONREACTIVE

## 2018-07-21 MED ORDER — GI COCKTAIL ~~LOC~~: 30.0000 mL | Freq: Two times a day (BID) | ORAL | 1 refills | Status: DC | PRN

## 2018-07-21 MED FILL — MAGIC MW (MAALOX/LIDOCAINE): 4 days supply | Qty: 240 | Fill #0

## 2018-07-21 NOTE — Progress Notes (Addendum)
Progress Note  Patient Name: Sherri Ruiz Date of Encounter: 07/21/2018  Primary Cardiologist: No primary care provider on file. New to Dr. Percival Spanish  Subjective   No further chest pain.  The patient feels that her pain was an esophageal spasm.  She is to have esophageal spasms many years ago and took sublingual Procardia for it.  This current episode was the worst she is ever had.  Inpatient Medications    Scheduled Meds: . aspirin EC  81 mg Oral Daily  . atorvastatin  20 mg Oral QHS  . enoxaparin (LOVENOX) injection  40 mg Subcutaneous Q24H  . levothyroxine  50 mcg Oral QAC breakfast  . lisinopril  5 mg Oral Daily  . pantoprazole  40 mg Oral Daily   Continuous Infusions:  PRN Meds: acetaminophen **OR** acetaminophen, ALPRAZolam, bismuth subsalicylate, nitroGLYCERIN, ondansetron **OR** ondansetron (ZOFRAN) IV   Vital Signs    Vitals:   07/20/18 1547 07/20/18 2114 07/21/18 0459 07/21/18 0637  BP: 135/85 101/66 (!) 90/43 105/81  Pulse: 65 (!) 57 (!) 57 60  Resp: 16 16 16    Temp: 98.7 F (37.1 C) 98.6 F (37 C) (!) 97.5 F (36.4 C)   TempSrc: Oral Oral Oral   SpO2: 98% 97% 100%   Weight:      Height:        Intake/Output Summary (Last 24 hours) at 07/21/2018 0803 Last data filed at 07/20/2018 2200 Gross per 24 hour  Intake 60 ml  Output -  Net 60 ml   Filed Weights   07/20/18 1026  Weight: 56.7 kg    Telemetry    Normal sinus rhythm- Personally Reviewed  ECG    Normal sinus rhythm at 55 bpm- Personally Reviewed  Physical Exam   GEN: No acute distress.   Neck: No JVD Cardiac: RRR, no murmurs, rubs, or gallops.  Respiratory: Clear to auscultation bilaterally. GI: Soft, nontender, non-distended  MS: No edema; No deformity. Neuro:  Nonfocal  Psych: Normal affect   Labs    Chemistry Recent Labs  Lab 07/20/18 1219 07/20/18 1613  NA 141  --   K 3.8  --   CL 103  --   CO2 32  --   GLUCOSE 99  --   BUN 7  --   CREATININE 0.79 0.81  CALCIUM  9.3  --   GFRNONAA >60 >60  GFRAA >60 >60  ANIONGAP 6  --      Hematology Recent Labs  Lab 07/20/18 1219 07/20/18 1613  WBC 5.3 4.8  RBC 4.71 4.31  HGB 15.2* 13.8  HCT 43.6 39.8  MCV 92.6 92.3  MCH 32.3 32.0  MCHC 34.9 34.7  RDW 12.8 12.7  PLT 239 219    Cardiac Enzymes Recent Labs  Lab 07/20/18 1613 07/20/18 2142 07/21/18 0417  TROPONINI <0.03 <0.03 <0.03    Recent Labs  Lab 07/20/18 1243  TROPIPOC 0.04     BNPNo results for input(s): BNP, PROBNP in the last 168 hours.   DDimer No results for input(s): DDIMER in the last 168 hours.   Radiology    Dg Chest 2 View  Result Date: 07/20/2018 CLINICAL DATA:  Chest pain. EXAM: CHEST - 2 VIEW COMPARISON:  None. FINDINGS: The heart size and mediastinal contours are within normal limits. Both lungs are clear. The visualized skeletal structures are unremarkable. IMPRESSION: Normal chest. Electronically Signed   By: Lorriane Shire M.D.   On: 07/20/2018 11:33    Cardiac Studies   none  Patient Profile     57 y.o. female with no prior cardiac history, presented with atypical chest pain.  Her other history includes hypothyroidism, hyperlipidemia, hypertension, esophagitis and laryngeal pharyngeal reflux disease.   Assessment & Plan    Atypical chest pain -Troponins have remained negative and EKG was non-acute.  Follow-up EKG this morning is without any abnormality. -Pretest probability of obstructive CAD is low. -No further chest pain.  -Plan for outpatient exercise tolerance test.  The patient works and she would like to call our office to arrange this at her convenience. I have placed the order, she just needs to schedule.   Hypertension -Treated with lisinopril 5 mg daily -Blood pressures well controlled, soft this morning  Dyslipidemia -Followed by primary care, treated with atorvastatin 20 mg, no lipid labs available in epic.  No change in care  [place] will sign off.   Medication Recommendations:  None Other recommendations (labs, testing, etc): Outpatient exercise tolerance test Follow up as an outpatient: Follow-up in our office as needed  For questions or updates, please contact [place] Please consult [email or web address] for contact info under        Signed, Daune Perch, NP  07/21/2018, 8:03 AM    History and all data above reviewed.    I agree with the findings as above.  The patient has had no symptoms.  OK to go home.  Please include her our office number on her discharge paperwork.    All available labs, radiology testing, previous records reviewed. Agree with documented assessment and plan. Out patient POET (Plain Old Exercise Treadmill).    Sherri Ruiz  2:50 PM  07/21/2018

## 2018-07-21 NOTE — Progress Notes (Signed)
Discharge instructions were reviewed with pt and family members. Prescriptions and AVS reviewed. No questions or concerns at this time.

## 2018-07-21 NOTE — Discharge Summary (Signed)
Physician Discharge Summary  Sherri Ruiz YPP:[id number] DOB: 08-15-1961 DOA: 07/20/2018  PCP: Nicoletta Dress, MD  Admit date: 07/20/2018 Discharge date: 07/21/2018  Admitted From: Home.  Disposition: Home.   Recommendations for Outpatient Follow-up:  1. Follow up with PCP in 1-2 weeks 2. Please obtain BMP/CBC in one week 3. Please follow up with cardiology for outpatient stress test for exercise.  4. Please follow up with gastroenterology in one week.   Discharge Condition:stable.  CODE STATUS: full code.  Diet recommendation: Heart Healthy   Brief/Interim Summary: Sherri Ruiz  is a 57 y.o. 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 She reports that her father died in his 20s with MI and mother has had strokes. EKG showed normal sinus rhythm at 61 with no ST-T changes. Hospitalist consulted for observation on telemetry for chest pain rule out.  Discharge Diagnoses:  Active Problems:   Chest pain at rest   Essential hypertension   Hyperlipidemia   Hypothyroidism   Nutcracker esophagus  Atypical chest pain ; resolved with GI cocktail.  Differential include ACS vs GERD vs esophageal spasm ACS RULED out.  Cardiology consulted and recommended outpatient exercise stress test.  Pt on nexium and discharged her on Gi cocktail to follow up with Dr Lyndel Safe in one week.    Hypertension;  Well controlled.    Hypothyroidism: Resume synthroid.    Hyperlipidemia:  Resume statin.   Discharge Instructions  Discharge Instructions    Diet - low sodium heart healthy   Complete by:  As directed    Discharge instructions   Complete by:  As directed    Please follow up wit GI in one week.     Allergies as of 07/21/2018      Reactions   Clindamycin/lincomycin Swelling   Meperidine Nausea And Vomiting   unknown   Penicillin G Other (See Comments)   Unknown/childhood allergy Has  patient had a PCN reaction causing immediate rash, facial/tongue/throat swelling, SOB or lightheadedness with hypotension: Yes Has patient had a PCN reaction causing severe rash involving mucus membranes or skin necrosis: Unknown Has patient had a PCN reaction that required hospitalization: No Has patient had a PCN reaction occurring within the last 10 years: Unknown If all of the above answers are "NO", then may proceed with Cephalosporin use.      Medication List    STOP taking these medications   MUCINEX CHILD MULTI-SYMPTOM PO   NYQUIL PO     TAKE these medications   ALPRAZolam 0.25 MG tablet Commonly known as:  XANAX Take 0.25 mg by mouth 2 (two) times daily as needed for anxiety.   atorvastatin 20 MG tablet Commonly known as:  LIPITOR Take 20 mg by mouth at bedtime.   bismuth subsalicylate 458 MG chewable tablet Commonly known as:  PEPTO BISMOL Chew 262 mg by mouth as needed.   CALCIUM PO Take by mouth daily.   DIGESTIVE ADVANTAGE PO Take by mouth daily.   gi cocktail Susp suspension Take 30 mLs by mouth 2 (two) times daily as needed for indigestion. Shake well.   levothyroxine 50 MCG tablet Commonly known as:  SYNTHROID, LEVOTHROID Take 50 mcg by mouth daily before breakfast.   lisinopril 5 MG tablet Commonly known as:  PRINIVIL,ZESTRIL Take 5 mg by mouth daily.   MINIVELLE 0.1 MG/24HR patch Generic drug:  estradiol   montelukast 10 MG tablet Commonly known as:  SINGULAIR Take one tablet by  mouth once daily as directed.   MULTIVITAMIN PO Take by mouth daily.   NASACORT ALLERGY 24HR 55 MCG/ACT Aero nasal inhaler Generic drug:  triamcinolone Place 1 spray into the nose daily.   NEXIUM 40 MG capsule Generic drug:  esomeprazole Take one capsule daily as directed   THERAFLU FLU/COLD PO Take by mouth.   VICKS VAPORUB EX Apply topically.   VITAMIN C PO Take by mouth daily.   ZYRTEC PO Take by mouth.      Follow-up Information    Minus Breeding, MD Follow up.   Specialty:  Cardiology Why:  Please call to schedule an exercise tolerance test and follow-up in our office. Contact information: [address] [place] [address] [phone]        Nicoletta Dress, MD. Schedule an appointment as soon as possible for a visit in 1 week(s).   Specialty:  Internal Medicine Contact information: [address] [place] [address] [phone]        Jackquline Denmark, MD. Schedule an appointment as soon as possible for a visit in 1 week(s).   Specialties:  Gastroenterology, Internal Medicine Contact information: [place] [address] [phone]          Allergies  Allergen Reactions  . Clindamycin/Lincomycin Swelling  . Meperidine Nausea And Vomiting    unknown  . Penicillin G Other (See Comments)    Unknown/childhood allergy Has patient had a PCN reaction causing immediate rash, facial/tongue/throat swelling, SOB or lightheadedness with hypotension: Yes Has patient had a PCN reaction causing severe rash involving mucus membranes or skin necrosis: Unknown Has patient had a PCN reaction that required hospitalization: No Has patient had a PCN reaction occurring within the last 10 years: Unknown If all of the above answers are "NO", then may proceed with Cephalosporin use.      Consultations:  Cardiology.    Procedures/Studies: Dg Chest 2 View  Result Date: 07/20/2018 CLINICAL DATA:  Chest pain. EXAM: CHEST - 2 VIEW COMPARISON:  None. FINDINGS: The heart size and mediastinal contours are within normal limits. Both lungs are clear. The visualized skeletal structures are unremarkable. IMPRESSION: Normal chest. Electronically Signed   By: Lorriane Shire M.D.   On: 07/20/2018 11:33       Subjective: No chest pain.   Discharge Exam: Vitals:   07/21/18 0459 07/21/18 0637  BP: (!) 90/43 105/81  Pulse: (!) 57 60  Resp: 16   Temp: (!)  97.5 F (36.4 C)   SpO2: 100%    Vitals:   07/20/18 1547 07/20/18 2114 07/21/18 0459 07/21/18 0637  BP: 135/85 101/66 (!) 90/43 105/81  Pulse: 65 (!) 57 (!) 57 60  Resp: 16 16 16    Temp: 98.7 F (37.1 C) 98.6 F (37 C) (!) 97.5 F (36.4 C)   TempSrc: Oral Oral Oral   SpO2: 98% 97% 100%   Weight:      Height:        General: Pt is alert, awake, not in acute distress Cardiovascular: RRR, S1/S2 +, no rubs, no gallops Respiratory: CTA bilaterally, no wheezing, no rhonchi Abdominal: Soft, NT, ND, bowel sounds + Extremities: no edema, no cyanosis    The results of significant diagnostics from this hospitalization (including imaging, microbiology, ancillary and laboratory) are listed below for reference.     Microbiology: No results found for this or any previous visit (from the past 240 hour(s)).   Labs: BNP (last  3 results) No results for input(s): BNP in the last 8760 hours. Basic Metabolic Panel: Recent Labs  Lab 07/20/18 1219 07/20/18 1613  NA 141  --   K 3.8  --   CL 103  --   CO2 32  --   GLUCOSE 99  --   BUN 7  --   CREATININE 0.79 0.81  CALCIUM 9.3  --    Liver Function Tests: No results for input(s): AST, ALT, ALKPHOS, BILITOT, PROT, ALBUMIN in the last 168 hours. No results for input(s): LIPASE, AMYLASE in the last 168 hours. No results for input(s): AMMONIA in the last 168 hours. CBC: Recent Labs  Lab 07/20/18 1219 07/20/18 1613  WBC 5.3 4.8  HGB 15.2* 13.8  HCT 43.6 39.8  MCV 92.6 92.3  PLT 239 219   Cardiac Enzymes: Recent Labs  Lab 07/20/18 1613 07/20/18 2142 07/21/18 0417  TROPONINI <0.03 <0.03 <0.03   BNP: Invalid input(s): POCBNP CBG: No results for input(s): GLUCAP in the last 168 hours. D-Dimer No results for input(s): DDIMER in the last 72 hours. Hgb A1c No results for input(s): HGBA1C in the last 72 hours. Lipid Profile No results for input(s): CHOL, HDL, LDLCALC, TRIG, CHOLHDL, LDLDIRECT in the last 72 hours. Thyroid  function studies No results for input(s): TSH, T4TOTAL, T3FREE, THYROIDAB in the last 72 hours.  Invalid input(s): FREET3 Anemia work up No results for input(s): VITAMINB12, FOLATE, FERRITIN, TIBC, IRON, RETICCTPCT in the last 72 hours. Urinalysis No results found for: COLORURINE, APPEARANCEUR, LABSPEC, Lambert, GLUCOSEU, HGBUR, BILIRUBINUR, KETONESUR, PROTEINUR, UROBILINOGEN, NITRITE, LEUKOCYTESUR Sepsis Labs Invalid input(s): PROCALCITONIN,  WBC,  LACTICIDVEN Microbiology No results found for this or any previous visit (from the past 240 hour(s)).   Time coordinating discharge: 31 minutes  SIGNED:   Hosie Poisson, MD  [facility] 07/21/2018, 4:59 PM Pager   If 7PM-7AM, please contact night-coverage [email or web address] Password [email or web address]

## 2018-07-30 DIAGNOSIS — F419 Anxiety disorder, unspecified: Secondary | ICD-10-CM | POA: Diagnosis not present

## 2018-07-30 DIAGNOSIS — K224 Dyskinesia of esophagus: Secondary | ICD-10-CM | POA: Diagnosis not present

## 2018-07-30 DIAGNOSIS — R0789 Other chest pain: Secondary | ICD-10-CM | POA: Diagnosis not present

## 2018-07-30 DIAGNOSIS — Z1331 Encounter for screening for depression: Secondary | ICD-10-CM | POA: Diagnosis not present

## 2018-08-02 ENCOUNTER — Telehealth: Payer: Self-pay

## 2018-08-02 DIAGNOSIS — F411 Generalized anxiety disorder: Secondary | ICD-10-CM | POA: Diagnosis not present

## 2018-08-02 NOTE — Telephone Encounter (Signed)
New message     Just an FYI. We have made several attempts to contact this patient including sending a letter to schedule or reschedule their CUP EXERCISE TOLERANCE TEST. We will be removing the patient from the WQ.   Thank you

## 2018-08-17 ENCOUNTER — Encounter: Payer: Self-pay | Admitting: Gastroenterology

## 2018-08-31 ENCOUNTER — Encounter: Payer: Self-pay | Admitting: Gastroenterology

## 2018-09-01 ENCOUNTER — Encounter (INDEPENDENT_AMBULATORY_CARE_PROVIDER_SITE_OTHER): Payer: Self-pay

## 2018-09-01 ENCOUNTER — Ambulatory Visit: Payer: Federal, State, Local not specified - PPO | Admitting: Gastroenterology

## 2018-09-01 ENCOUNTER — Encounter: Payer: Self-pay | Admitting: Gastroenterology

## 2018-09-01 VITALS — BP 122/78 | HR 73 | Ht 63.0 in | Wt 125.0 lb

## 2018-09-01 DIAGNOSIS — R1013 Epigastric pain: Secondary | ICD-10-CM | POA: Diagnosis not present

## 2018-09-01 DIAGNOSIS — K200 Eosinophilic esophagitis: Secondary | ICD-10-CM | POA: Diagnosis not present

## 2018-09-01 MED ORDER — AMBULATORY NON FORMULARY MEDICATION: 0 refills | Status: DC

## 2018-09-01 MED ORDER — FLUTICASONE PROPIONATE HFA 110 MCG/ACT IN AERO: 1.0000 | INHALATION_SPRAY | Freq: Two times a day (BID) | RESPIRATORY_TRACT | 12 refills | Status: DC

## 2018-09-01 NOTE — Progress Notes (Signed)
Chief Complaint:   Referring Provider:  Nicoletta Dress, MD      ASSESSMENT AND PLAN;   #1. EoE with gastroesophageal reflux and noncardiac chest pains. Dx 05/2014, treated with PPIs and Flovent, did very well on food restriction.  Subsequent EGD 12/25/2017 with eso Bx- neg.  #2. Epi pain/noncardiac chest pains.  History of cholecystectomy due to biliary dyskinesia by Dr. Noberto Retort 01/2003  Plan - Continue nexium 40 mg p.o. once a day. - Avoid diary, eggs, potatoes, chocolate and caffeine as suggested by Dr. Neldon Mc. - Start Flovent inhalers (129mcg/inh) twice daily for 1 month, then daily for 2 months and then once a day for 1 month or any steroid inhaler covered by insurance.  Must swallow.  Do not eat or drink or rinse for 20-30 minutes thereafter.  Then can rinse with warm water. - GI cocktail (equal amounts of Maalox, viscous lidocaine, dicyclomine or Donnatal).  10 cc p.o. every 8 hours as needed. #120 ml. - Proceed with abdominal ultrasound complete. - Avoid nonsteroidals. - Patient is to call us if she still has problems.   HPI:    Sherri Ruiz is a 57 y.o. female  With Chest pain/epigastric pain Took pepto but got worse, took Xanax, didn't work Seen in ED at [facility]- neg cardiac cardiac enzymes, admitted for observation.Given GI cocktail with some relief. She was told to follow-up here. Patient has not been compliant with a diet.  She has been eating potatoes and eggs previously. Denies having any dysphagia or odynophagia currently. Feels just like " attacks of EoE". Does admit that she has been under considerable stress due to family problems (daughters with the grandkids had moved in.  At one point she had 11 people living in her home, she also recently has been promoted in her job)  Past GI Proc: - EGD 05/2014-wide-open cervical esophageal web, mild gastritis, gastric polyps.  Esophageal biopsies showed increased eosinophils more than 20 per high-power field consistent with  eosinophilic esophagitis.  Gastric biopsies showed reactive gastropathy, intestinal metaplasia.  Subsequent EGD 7/0/6237-[id number] of eosinophilic esophagitis on biopsies, gastric mapping negative for intestinal metaplasia, small incidental gastric polyps measuring 4 to 6 mm.  EGD 2002 at [place] showed esophagitis, dysphagia due to nutcracker esophagus status post multiple EGDs with dilatations, 1991 at [place], [place] at [facility] with dilation 66 Fr followed by manometry showing nutcracker esophagus, 1994 esophageal dilatation with 53 Fr -Colonoscopy 10/07/2013-good prep, moderate sigmoid diverticulosis, small internal hemorrhoids.  Otherwise normal to TI.  Next due 09/2023 unless with any new problems. 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 Past Medical History:  Diagnosis Date  . Allergic rhinitis   . Eosinophilic esophagitis   . High blood pressure   . High cholesterol   . Hypothyroidism   . LPRD (laryngopharyngeal reflux disease)   . Penicillin allergy     Past Surgical History:  Procedure Laterality Date  . CHOLECYSTECTOMY    . ESOPHAGOGASTRODUODENOSCOPY  06/16/2014   Wide open cervial esophageal web. Mild gastrtitis. Gastric polyps.   . OTHER SURGICAL HISTORY     Hysterectomy  . TOTAL ABDOMINAL HYSTERECTOMY      Family History  Problem Relation Age of Onset  . Clotting disorder Mother   . High Cholesterol Mother   . Asthma Mother   . Stroke Mother   . High blood pressure Father   . Heart attack Father 48       [place].  Atrial fib.    . Cancer Sister   . Asthma Daughter   . Heart disease Brother        Pacemaker  . Colon cancer Neg Hx     Social History   Tobacco Use  . Smoking status: Former Smoker    Types: Cigarettes    Last attempt to quit: 1989    Years since quitting: 30.8  . Smokeless tobacco: Never Used  Substance Use Topics  . Alcohol use: Never    Frequency: Never  . Drug  use: Never    Current Outpatient Medications  Medication Sig Dispense Refill  . ALPRAZolam (XANAX) 0.25 MG tablet Take 0.25 mg by mouth 2 (two) times daily as needed for anxiety.  0  . Alum & Mag Hydroxide-Simeth (GI COCKTAIL) SUSP suspension Take 30 mLs by mouth 2 (two) times daily as needed for indigestion. Shake well. 240 mL 1  . atorvastatin (LIPITOR) 20 MG tablet Take 20 mg by mouth at bedtime.   1  . bismuth subsalicylate (PEPTO BISMOL) 262 MG chewable tablet Chew 262 mg by mouth as needed.    . Cetirizine HCl (ZYRTEC PO) Take by mouth.    . levothyroxine (SYNTHROID, LEVOTHROID) 50 MCG tablet Take 50 mcg by mouth daily before breakfast.     . lisinopril (PRINIVIL,ZESTRIL) 5 MG tablet Take 5 mg by mouth daily.     Marland Kitchen MINIVELLE 0.1 MG/24HR patch     . montelukast (SINGULAIR) 10 MG tablet Take one tablet by mouth once daily as directed. 30 tablet 11  . Multiple Vitamins-Minerals (MULTIVITAMIN PO) Take by mouth daily.    Marland Kitchen NEXIUM 40 MG capsule Take one capsule daily as directed 30 capsule 5  . Probiotic Product (DIGESTIVE ADVANTAGE PO) Take by mouth daily.    Marland Kitchen triamcinolone (NASACORT ALLERGY 24HR) 55 MCG/ACT AERO nasal inhaler Place 1 spray into the nose daily.     No current facility-administered medications for this visit.     Allergies  Allergen Reactions  . Clindamycin/Lincomycin Swelling  . Meperidine Nausea And Vomiting    unknown  . Penicillin G Other (See Comments)    Unknown/childhood allergy Has patient had a PCN reaction causing immediate rash, facial/tongue/throat swelling, SOB or lightheadedness with hypotension: Yes Has patient had a PCN reaction causing severe rash involving mucus membranes or skin necrosis: Unknown Has patient had a PCN reaction that required hospitalization: No Has patient had a PCN reaction occurring within the last 10 years: Unknown If all of the above answers are "NO", then may proceed with Cephalosporin use.      Review of Systems:    Constitutional: Denies fever, chills, diaphoresis, appetite change and has fatigue.  HEENT: Denies photophobia, eye pain, redness, hearing loss, ear pain, congestion, sore throat, rhinorrhea, sneezing, mouth sores, neck pain, neck stiffness and tinnitus.   Respiratory: Denies SOB, DOE, cough, chest tightness,  and wheezing.   Cardiovascular: Denies chest pain, palpitations and leg swelling.  Genitourinary: Denies dysuria, urgency, frequency, hematuria, flank pain and difficulty urinating.  Musculoskeletal: Denies myalgias, back pain, joint swelling, arthralgias and gait problem.  Skin: No rash.  Neurological: Denies dizziness, seizures, syncope, weakness, light-headedness, numbness and headaches.  Hematological: Denies adenopathy. Easy bruising, personal or family bleeding history  Psychiatric/Behavioral: has anxiety or depression     Physical Exam:    BP 122/78   Pulse 73   Ht 5\' 3"  (1.6 m)   Wt 125 lb (56.7 kg)   LMP 07/20/2018   SpO2 98%  BMI 22.14 kg/m  Filed Weights   09/01/18 0840  Weight: 125 lb (56.7 kg)   Constitutional:  Well-developed, in no acute distress. Psychiatric: Normal mood and affect. Behavior is normal. HEENT: Pupils normal.  Conjunctivae are normal. No scleral icterus. Neck supple.  Cardiovascular: Normal rate, regular rhythm. No edema Pulmonary/chest: Effort normal and breath sounds normal. No wheezing, rales or rhonchi. Abdominal: Soft, nondistended. Nontender. Bowel sounds active throughout. There are no masses palpable. No hepatomegaly. Rectal:  defered Neurological: Alert and oriented to person place and time. Skin: Skin is warm and dry. No rashes noted.  Data Reviewed: I have personally reviewed following labs and imaging studies  CBC: CBC Latest Ref Rng & Units 07/20/2018 07/20/2018  WBC 4.0 - 10.5 K/uL 4.8 5.3  Hemoglobin 12.0 - 15.0 g/dL 13.8 15.2(H)  Hematocrit 36.0 - 46.0 % 39.8 43.6  Platelets 150 - 400 K/uL 219 239    CMP: CMP  Latest Ref Rng & Units 07/20/2018 07/20/2018  Glucose 70 - 99 mg/dL - 99  BUN 6 - 20 mg/dL - 7  Creatinine 0.44 - 1.00 mg/dL 0.81 0.79  Sodium 135 - 145 mmol/L - 141  Potassium 3.5 - 5.1 mmol/L - 3.8  Chloride 98 - 111 mmol/L - 103  CO2 22 - 32 mmol/L - 32  Calcium 8.9 - 10.3 mg/dL - 9.3  25 minutes spent with the patient today. Greater than 50% was spent in counseling and coordination of care with the patient    Carmell Austria, MD 09/01/2018, 8:55 AM  Cc: Nicoletta Dress, MD

## 2018-09-01 NOTE — Patient Instructions (Signed)
If you are age 55 or older, your body mass index should be between 23-30. Your Body mass index is 22.14 kg/m. If this is out of the aforementioned range listed, please consider follow up with your Primary Care Provider.  If you are age 8 or younger, your body mass index should be between 19-25. Your Body mass index is 22.14 kg/m. If this is out of the aformentioned range listed, please consider follow up with your Primary Care Provider.   You have been scheduled for an abdominal ultrasound at [facility] (1st floor ) on 09/07/18 at 10am. Please arrive 15 minutes prior to your appointment for registration. Make certain not to have anything to eat or drink 6 hours prior to your appointment. Should you need to reschedule your appointment, please contact radiology at [phone]. This test typically takes about 30 minutes to perform.   We have sent the following medications to your pharmacy for you to pick up at your convenience: Flovent  GI Cocktail  Thank you,  Dr. Jackquline Denmark

## 2018-09-07 ENCOUNTER — Ambulatory Visit (HOSPITAL_BASED_OUTPATIENT_CLINIC_OR_DEPARTMENT_OTHER)
Admission: RE | Admit: 2018-09-07 | Discharge: 2018-09-07 | Disposition: A | Discharge status: Home / Self Care | Payer: Federal, State, Local not specified - PPO | Source: Ambulatory Visit | Attending: Gastroenterology | Admitting: Gastroenterology

## 2018-09-07 ENCOUNTER — Telehealth: Payer: Self-pay | Admitting: Gastroenterology

## 2018-09-07 DIAGNOSIS — R1013 Epigastric pain: Secondary | ICD-10-CM | POA: Insufficient documentation

## 2018-09-07 DIAGNOSIS — Z9049 Acquired absence of other specified parts of digestive tract: Secondary | ICD-10-CM | POA: Diagnosis not present

## 2018-09-07 DIAGNOSIS — D1771 Benign lipomatous neoplasm of kidney: Secondary | ICD-10-CM | POA: Insufficient documentation

## 2018-09-07 DIAGNOSIS — K200 Eosinophilic esophagitis: Secondary | ICD-10-CM | POA: Insufficient documentation

## 2018-09-07 DIAGNOSIS — D1801 Hemangioma of skin and subcutaneous tissue: Secondary | ICD-10-CM | POA: Diagnosis not present

## 2018-09-07 MED ORDER — FLUTICASONE PROPIONATE HFA 110 MCG/ACT IN AERO: 1.0000 | INHALATION_SPRAY | Freq: Two times a day (BID) | RESPIRATORY_TRACT | 12 refills | Status: DC

## 2018-09-07 NOTE — Telephone Encounter (Signed)
Sent medication to different pharmacy.

## 2018-09-15 MED ORDER — FLUTICASONE PROPIONATE HFA 110 MCG/ACT IN AERO: 1.0000 | INHALATION_SPRAY | Freq: Two times a day (BID) | RESPIRATORY_TRACT | 0 refills | Status: DC

## 2018-09-15 NOTE — Telephone Encounter (Signed)
Pt husband states they need new prescription for 90 days supply to get the discount with [place]

## 2018-09-15 NOTE — Telephone Encounter (Signed)
Sent refill to patients pharmacy. 

## 2018-09-27 ENCOUNTER — Telehealth: Payer: Self-pay | Admitting: Gastroenterology

## 2018-09-27 MED ORDER — FLUTICASONE PROPIONATE HFA 110 MCG/ACT IN AERO: 1.0000 | INHALATION_SPRAY | Freq: Two times a day (BID) | RESPIRATORY_TRACT | 0 refills | Status: DC

## 2018-09-27 NOTE — Telephone Encounter (Signed)
Sent in patients prescription.

## 2018-09-30 DIAGNOSIS — Z6822 Body mass index (BMI) 22.0-22.9, adult: Secondary | ICD-10-CM | POA: Diagnosis not present

## 2018-09-30 DIAGNOSIS — J019 Acute sinusitis, unspecified: Secondary | ICD-10-CM | POA: Diagnosis not present

## 2018-11-01 DIAGNOSIS — F411 Generalized anxiety disorder: Secondary | ICD-10-CM | POA: Diagnosis not present

## 2018-11-08 DIAGNOSIS — A084 Viral intestinal infection, unspecified: Secondary | ICD-10-CM | POA: Diagnosis not present

## 2018-11-09 ENCOUNTER — Telehealth: Payer: Self-pay | Admitting: Gastroenterology

## 2018-11-09 ENCOUNTER — Telehealth: Payer: Self-pay

## 2018-11-09 MED ORDER — AMBULATORY NON FORMULARY MEDICATION: 2 refills | Status: DC

## 2018-11-09 NOTE — Telephone Encounter (Signed)
Resent this prescription to [facility], patient is aware.

## 2018-11-09 NOTE — Telephone Encounter (Signed)
I have faxed new prescription to patients pharmacy.

## 2018-11-09 NOTE — Telephone Encounter (Signed)
Patient called asking about where her GI cocktail was sent. I told her that it looks like it was sent to [facility]. A rep at the pharmacy told me at first that this patient wasn't in the system. When I told her it was faxed she placed me on hold to see about this and talked to [place] who told me that one of the gi cocktail ingredients, donnatol, they do not have. So they suggested that we can switch one of the ingredients out to make the gi cocktail per the patient. I told her that we would have to see about it or if we would have to send it to someone else. I told the patient was told to me by Evelena Peat and told her that Dr Steve Rattler CMA will have to work on this tomorrow since she isn't available right now. And if she hasn't heard anything by tomorrow afternoon then please call back

## 2018-11-09 NOTE — Telephone Encounter (Signed)
We can call in a new prescription please 120 mL, 2 refills

## 2018-11-09 NOTE — Telephone Encounter (Signed)
Please advise 

## 2018-11-09 NOTE — Telephone Encounter (Signed)
Patient states she was prescribed the gi cocktail in Sept but per the bottle it expired 10.9.2019. Patient wanting to know if medication is still good or should she get a new prescription.

## 2018-11-10 NOTE — Telephone Encounter (Signed)
Called and clarified with the pharmacy that they could switch Donnatol to Bentyl. Patient was notified.

## 2018-11-10 NOTE — Telephone Encounter (Signed)
Please do

## 2018-11-10 NOTE — Telephone Encounter (Signed)
Dr Lyndel Safe, would you like to switch Donnatol to Bentyl?

## 2018-11-11 DIAGNOSIS — R1013 Epigastric pain: Secondary | ICD-10-CM | POA: Diagnosis not present

## 2018-11-12 ENCOUNTER — Encounter: Payer: Self-pay | Admitting: Gastroenterology

## 2018-11-12 DIAGNOSIS — R1013 Epigastric pain: Secondary | ICD-10-CM | POA: Diagnosis not present

## 2018-11-15 ENCOUNTER — Ambulatory Visit: Payer: Federal, State, Local not specified - PPO | Admitting: Gastroenterology

## 2018-11-15 ENCOUNTER — Encounter: Payer: Self-pay | Admitting: Gastroenterology

## 2018-11-15 VITALS — BP 114/76 | HR 71 | Ht 62.5 in | Wt 127.0 lb

## 2018-11-15 DIAGNOSIS — K219 Gastro-esophageal reflux disease without esophagitis: Secondary | ICD-10-CM

## 2018-11-15 DIAGNOSIS — K200 Eosinophilic esophagitis: Secondary | ICD-10-CM | POA: Diagnosis not present

## 2018-11-15 DIAGNOSIS — R1013 Epigastric pain: Secondary | ICD-10-CM

## 2018-11-15 MED ORDER — AMBULATORY NON FORMULARY MEDICATION: 2 refills | Status: DC

## 2018-11-15 MED ORDER — HYOSCYAMINE SULFATE 0.125 MG SL SUBL: 0.1250 mg | SUBLINGUAL_TABLET | Freq: Four times a day (QID) | SUBLINGUAL | 3 refills | Status: DC | PRN

## 2018-11-15 NOTE — Progress Notes (Signed)
Chief Complaint:   Referring Provider:  Nicoletta Dress, MD      ASSESSMENT AND PLAN;   #1. EoE with GERD/NCCP. Dx 05/2014, treated with PPIs and Flovent, did very well on food restriction.  Subsequent EGD 12/25/2017 with eso Bx- neg.  #2. Epi pain/NCCP.  H/O chole d/t biliary dyskinesia by Dr. Noberto Retort 01/2003. Neg [place] 08/2018  Plan - Continue nexium 40 mg p.o. once a day. - Avoid diary, eggs, potatoes, chocolate and caffeine as suggested by Dr. Neldon Mc. - GI cocktail (equal amounts of Maalox, viscous lidocaine, dicyclomine or Donnatal).  10 cc p.o. every 8 hours as needed. #120 ml. 2 refills - Avoid nonsteroidals. - Patient is to call us if she still has problems. - Please obtain previous recent blood and stool tests  Dr Delena Bali. - Avoid pushing buggies x 2 weeks. - Levsin 0.125mg  S/L q6 hrs. #120. 2 refills - If still with problems, trial of Flovent inhalers (111mcg/inh) twice daily for 1 month, then daily for 2 months and then once a day for 1 month or any steroid inhaler covered by insurance.  Must swallow.  Do not eat or drink or rinse for 20-30 minutes thereafter.  Then can rinse with warm water.   HPI:    Rashaun Kaufman is a 58 y.o. female  Episode of nausea/vomiting/epigastric pain associated with diarrhea-diagnosed with viral gastroenteritis-nausea and epigastric pain persisted.  Seen by Dr. Delena Bali, had blood work and stool testing.  Given Phenergan with some relief.  Thereafter given Carafate with some relief.  She had GI cocktail at home which was expired.  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  No dysphagia.  No weight loss.   Does admit that she has been under considerable stress due to family problems (daughters with the grandkids had moved in.  At one point she had 11 people living in her home,  she also recently has been promoted in her job)  Past GI Proc: - EGD 05/2014-wide-open cervical esophageal web, mild gastritis, gastric polyps.  Eso Bx- showed increased eosinophils over 20 per HPF c/w eosinophilic esophagitis.  Gastric Bx- reactive gastropathy, intestinal metaplasia.  Subsequent EGD 01/29/8098-[id number] of eosinophilic esophagitis on biopsies, gastric mapping negative for intestinal metaplasia, small incidental gastric polyps measuring 4 to 6 mm.  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  Otherwise normal to TI.  Next due 09/2023 unless with any new problems. 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 Past Medical History:  Diagnosis Date  . Allergic rhinitis   . Eosinophilic esophagitis   . High blood pressure   . High cholesterol   . Hypothyroidism   . LPRD (laryngopharyngeal reflux disease)   . Penicillin allergy     Past Surgical History:  Procedure Laterality Date  . CHOLECYSTECTOMY    . ESOPHAGOGASTRODUODENOSCOPY  06/16/2014   Wide open cervial esophageal web. Mild gastrtitis. Gastric polyps.   . ESOPHAGOGASTRODUODENOSCOPY  12/25/2017  . OTHER SURGICAL HISTORY     Hysterectomy  . TOTAL ABDOMINAL HYSTERECTOMY  Family History  Problem Relation Age of Onset  . Clotting disorder Mother   . High Cholesterol Mother   . Asthma Mother   . Stroke Mother   . High blood pressure Father   . Heart attack Father 43       [place].  Atrial fib.    . Cancer Sister   . Asthma Daughter   . Heart disease Brother        Pacemaker  . Colon cancer Neg Hx     Social History   Tobacco Use  .  Smoking status: Former Smoker    Types: Cigarettes    Last attempt to quit: 1989    Years since quitting: 31.0  . Smokeless tobacco: Never Used  Substance Use Topics  . Alcohol use: Not Currently    Frequency: Never  . Drug use: Never    Current Outpatient Medications  Medication Sig Dispense Refill  . ALPRAZolam (XANAX) 0.25 MG tablet Take 0.25 mg by mouth as needed for anxiety.   0  . Ascorbic Acid (VITAMIN C) 500 MG CAPS Take 1 tablet by mouth daily.    Marland Kitchen atorvastatin (LIPITOR) 20 MG tablet Take 20 mg by mouth at bedtime.   1  . Calcium Carbonate Antacid (CALCIUM CARBONATE PO) Take 1 tablet by mouth daily. Chewables    . Cetirizine HCl (ZYRTEC PO) Take by mouth.    . levothyroxine (SYNTHROID, LEVOTHROID) 50 MCG tablet Take 50 mcg by mouth daily before breakfast.     . lisinopril (PRINIVIL,ZESTRIL) 5 MG tablet Take 5 mg by mouth daily.     Marland Kitchen MINIVELLE 0.1 MG/24HR patch     . montelukast (SINGULAIR) 10 MG tablet Take one tablet by mouth once daily as directed. 30 tablet 11  . Multiple Vitamins-Minerals (MULTIVITAMIN PO) Take by mouth daily.    Marland Kitchen NEXIUM 40 MG capsule Take one capsule daily as directed 30 capsule 5  . Probiotic Product (DIGESTIVE ADVANTAGE PO) Take by mouth daily.    Marland Kitchen triamcinolone (NASACORT ALLERGY 24HR) 55 MCG/ACT AERO nasal inhaler Place 1 spray into the nose as needed.     . Alum & Mag Hydroxide-Simeth (GI COCKTAIL) SUSP suspension Take 30 mLs by mouth 2 (two) times daily as needed for indigestion. Shake well. (Patient not taking: Reported on 11/15/2018) 240 mL 1  . AMBULATORY NON FORMULARY MEDICATION Medication Name: GI Cocktail Take 10cc every 6 hours as needed.   Donnatol, Maalox, Viscous Lidocaine equal parts. (Patient not taking: Reported on 11/15/2018) 120 mL 2  . bismuth subsalicylate (PEPTO BISMOL) 262 MG chewable tablet Chew 262 mg by mouth as needed.    . fluticasone (FLOVENT HFA) 110 MCG/ACT inhaler Inhale 1 puff into the lungs 2 (two) times daily. Must  swallow, do not eat or drink or rinse for 20-30 minutes after. Then rinse with warm water. (Patient not taking: Reported on 11/15/2018) 3 Inhaler 0   No current facility-administered medications for this visit.     Allergies  Allergen Reactions  . Clindamycin/Lincomycin Swelling  . Meperidine Nausea And Vomiting    unknown  . Penicillin G Other (See Comments)    Unknown/childhood allergy Has patient had a PCN reaction causing immediate rash, facial/tongue/throat swelling, SOB or lightheadedness with hypotension: Yes Has patient had a PCN reaction causing severe rash involving mucus membranes or skin necrosis: Unknown Has patient had a PCN reaction that required hospitalization: No Has patient had a PCN reaction occurring within the last 10 years: Unknown If all  of the above answers are "NO", then may proceed with Cephalosporin use.      Review of Systems:  Negative except for HPI     Physical Exam:    BP 114/76   Pulse 71   Ht 5' 2.5" (1.588 m)   Wt 127 lb (57.6 kg)   LMP 07/20/2018   BMI 22.86 kg/m  Filed Weights   11/15/18 0939  Weight: 127 lb (57.6 kg)   Constitutional:  Well-developed, in no acute distress. Psychiatric: Normal mood and affect. Behavior is normal. HEENT: Pupils normal.  Conjunctivae are normal. No scleral icterus. Neck supple.  Cardiovascular: Normal rate, regular rhythm. No edema Pulmonary/chest: Effort normal and breath sounds normal. No wheezing, rales or rhonchi. Abdominal: Soft, nondistended.  Mild epigastric tenderness. Bowel sounds active throughout. There are no masses palpable. No hepatomegaly. Rectal:  defered Neurological: Alert and oriented to person place and time. Skin: Skin is warm and dry. No rashes noted.  Data Reviewed: I have personally reviewed following labs and imaging studies  CBC: CBC Latest Ref Rng & Units 07/20/2018 07/20/2018  WBC 4.0 - 10.5 K/uL 4.8 5.3  Hemoglobin 12.0 - 15.0 g/dL 13.8 15.2(H)  Hematocrit 36.0 -  46.0 % 39.8 43.6  Platelets 150 - 400 K/uL 219 239    CMP: CMP Latest Ref Rng & Units 07/20/2018 07/20/2018  Glucose 70 - 99 mg/dL - 99  BUN 6 - 20 mg/dL - 7  Creatinine 0.44 - 1.00 mg/dL 0.81 0.79  Sodium 135 - 145 mmol/L - 141  Potassium 3.5 - 5.1 mmol/L - 3.8  Chloride 98 - 111 mmol/L - 103  CO2 22 - 32 mmol/L - 32  Calcium 8.9 - 10.3 mg/dL - 9.3  15 minutes spent with the patient today. Greater than 50% was spent in counseling and coordination of care with the patient    Carmell Austria, MD 11/15/2018, 9:57 AM  Cc: Nicoletta Dress, MD

## 2018-11-15 NOTE — Patient Instructions (Signed)
If you are age 56 or older, your body mass index should be between 23-30. Your Body mass index is 22.86 kg/m. If this is out of the aforementioned range listed, please consider follow up with your Primary Care Provider.  If you are age 75 or younger, your body mass index should be between 19-25. Your Body mass index is 22.86 kg/m. If this is out of the aformentioned range listed, please consider follow up with your Primary Care Provider.   We have sent the following medications to your pharmacy for you to pick up at your convenience: GI Cocktail Levsin   Thank you,  Dr. Jackquline Denmark

## 2018-11-16 DIAGNOSIS — F411 Generalized anxiety disorder: Secondary | ICD-10-CM | POA: Diagnosis not present

## 2018-11-17 DIAGNOSIS — K080 Exfoliation of teeth due to systemic causes: Secondary | ICD-10-CM | POA: Diagnosis not present

## 2018-11-23 DIAGNOSIS — F411 Generalized anxiety disorder: Secondary | ICD-10-CM | POA: Diagnosis not present

## 2018-11-30 DIAGNOSIS — F411 Generalized anxiety disorder: Secondary | ICD-10-CM | POA: Diagnosis not present

## 2018-12-01 DIAGNOSIS — B3781 Candidal esophagitis: Secondary | ICD-10-CM | POA: Diagnosis not present

## 2018-12-07 DIAGNOSIS — F411 Generalized anxiety disorder: Secondary | ICD-10-CM | POA: Diagnosis not present

## 2018-12-20 DIAGNOSIS — N6002 Solitary cyst of left breast: Secondary | ICD-10-CM | POA: Diagnosis not present

## 2018-12-20 DIAGNOSIS — Z01419 Encounter for gynecological examination (general) (routine) without abnormal findings: Secondary | ICD-10-CM | POA: Diagnosis not present

## 2018-12-20 DIAGNOSIS — N393 Stress incontinence (female) (male): Secondary | ICD-10-CM | POA: Diagnosis not present

## 2018-12-20 DIAGNOSIS — Z1231 Encounter for screening mammogram for malignant neoplasm of breast: Secondary | ICD-10-CM | POA: Diagnosis not present

## 2018-12-21 DIAGNOSIS — F411 Generalized anxiety disorder: Secondary | ICD-10-CM | POA: Diagnosis not present

## 2018-12-23 DIAGNOSIS — J019 Acute sinusitis, unspecified: Secondary | ICD-10-CM | POA: Diagnosis not present

## 2018-12-27 DIAGNOSIS — R928 Other abnormal and inconclusive findings on diagnostic imaging of breast: Secondary | ICD-10-CM | POA: Diagnosis not present

## 2018-12-27 DIAGNOSIS — N6489 Other specified disorders of breast: Secondary | ICD-10-CM | POA: Diagnosis not present

## 2018-12-27 DIAGNOSIS — N6002 Solitary cyst of left breast: Secondary | ICD-10-CM | POA: Diagnosis not present

## 2019-01-04 DIAGNOSIS — F411 Generalized anxiety disorder: Secondary | ICD-10-CM | POA: Diagnosis not present

## 2019-01-18 DIAGNOSIS — F411 Generalized anxiety disorder: Secondary | ICD-10-CM | POA: Diagnosis not present

## 2019-01-20 DIAGNOSIS — K080 Exfoliation of teeth due to systemic causes: Secondary | ICD-10-CM | POA: Diagnosis not present

## 2019-01-25 DIAGNOSIS — F411 Generalized anxiety disorder: Secondary | ICD-10-CM | POA: Diagnosis not present

## 2019-01-26 ENCOUNTER — Encounter: Payer: Self-pay | Admitting: Allergy and Immunology

## 2019-01-26 ENCOUNTER — Other Ambulatory Visit: Payer: Self-pay

## 2019-01-26 ENCOUNTER — Ambulatory Visit (INDEPENDENT_AMBULATORY_CARE_PROVIDER_SITE_OTHER): Payer: Federal, State, Local not specified - PPO | Admitting: Allergy and Immunology

## 2019-01-26 VITALS — BP 132/88 | HR 67 | Resp 16

## 2019-01-26 DIAGNOSIS — K219 Gastro-esophageal reflux disease without esophagitis: Secondary | ICD-10-CM | POA: Diagnosis not present

## 2019-01-26 DIAGNOSIS — K200 Eosinophilic esophagitis: Secondary | ICD-10-CM

## 2019-01-26 DIAGNOSIS — J3089 Other allergic rhinitis: Secondary | ICD-10-CM

## 2019-01-26 NOTE — Progress Notes (Signed)
[place] [facility] [place]   Follow-up Note  Referring Provider: Nicoletta Dress, MD Primary Provider: Nicoletta Dress, MD Date of Office Visit: 01/26/2019  Subjective:   Sherri Ruiz (DOB: 1961/02/23) is a 58 y.o. female who returns to the [facility] on 01/26/2019 in re-evaluation of the following:  HPI: Sherri Ruiz presents to this clinic in reevaluation of allergic rhinitis and LPR and eosinophilic esophagitis.  Her last visit to this clinic was 28 January 2018.  Since the pollen has arrived she has developed some issues with nasal congestion and some head fullness especially on her cheeks without any fever or ugly nasal discharge or other systemic or constitutional symptoms other than the fact that she might have some ear fullness bilaterally with this issue.  She went to see her dentist thinking that she might have a sinus infection and the dentist performed an x-ray and she is not really sure about the results of that x-ray.  She restarted her Nasacort over the course of the past week and she slightly better.  Apparently in February she developed "sinusitis" which may have been associated with a fever and nasal congestion and sneezing for which she might have been treated with Levaquin by Dr. Delena Bali but fortunately all that issue has resolved.  Otherwise, she has not required a systemic steroid or antibiotic for any type of respiratory tract issue since her last visit in this clinic.  Her GI tract issue is going quite well as she is very careful about eliminating dairy from her diet and she continues on a proton pump inhibitor.  She also remains away from eating potatoes and chocolate and caffeine and egg.  Overall she is very satisfied with the response that she is received with her current plan.  Allergies as of 01/26/2019      Reactions   Clindamycin/lincomycin Swelling   Meperidine Nausea And Vomiting   unknown   Penicillin G Other  (See Comments)   Unknown/childhood allergy Has patient had a PCN reaction causing immediate rash, facial/tongue/throat swelling, SOB or lightheadedness with hypotension: Yes Has patient had a PCN reaction causing severe rash involving mucus membranes or skin necrosis: Unknown Has patient had a PCN reaction that required hospitalization: No Has patient had a PCN reaction occurring within the last 10 years: Unknown If all of the above answers are "NO", then may proceed with Cephalosporin use.      Medication List      ALPRAZolam 0.25 MG tablet Commonly known as:  XANAX Take 0.25 mg by mouth as needed for anxiety.   atorvastatin 20 MG tablet Commonly known as:  LIPITOR Take 20 mg by mouth at bedtime.   bismuth subsalicylate 625 MG chewable tablet Commonly known as:  PEPTO BISMOL Chew 262 mg by mouth as needed.   CALCIUM CARBONATE PO Take 1 tablet by mouth daily. Chewables   DIGESTIVE ADVANTAGE PO Take by mouth daily.   levothyroxine 50 MCG tablet Commonly known as:  SYNTHROID, LEVOTHROID Take 50 mcg by mouth daily before breakfast.   lisinopril 5 MG tablet Commonly known as:  PRINIVIL,ZESTRIL Take 5 mg by mouth daily.   Minivelle 0.1 MG/24HR patch Generic drug:  estradiol   montelukast 10 MG tablet Commonly known as:  SINGULAIR Take one tablet by mouth once daily as directed.   MULTIVITAMIN PO Take by mouth daily.   Nasacort Allergy 24HR 55 MCG/ACT Aero nasal inhaler Generic drug:  triamcinolone Place 1 spray into  the nose as needed.   NexIUM 40 MG capsule Generic drug:  esomeprazole Take one capsule daily as directed   Vitamin C 500 MG Caps Take 1 tablet by mouth daily.   ZYRTEC PO Take by mouth.       Past Medical History:  Diagnosis Date  . Allergic rhinitis   . Eosinophilic esophagitis   . High blood pressure   . High cholesterol   . Hypothyroidism   . LPRD (laryngopharyngeal reflux disease)   . Penicillin allergy     Past Surgical History:   Procedure Laterality Date  . CHOLECYSTECTOMY    . ESOPHAGOGASTRODUODENOSCOPY  06/16/2014   Wide open cervial esophageal web. Mild gastrtitis. Gastric polyps.   . ESOPHAGOGASTRODUODENOSCOPY  12/25/2017  . OTHER SURGICAL HISTORY     Hysterectomy  . TOTAL ABDOMINAL HYSTERECTOMY      Review of systems negative except as noted in HPI / PMHx or noted below:  Review of Systems  Constitutional: Negative.   HENT: Negative.   Eyes: Negative.   Respiratory: Negative.   Cardiovascular: Negative.   Gastrointestinal: Negative.   Genitourinary: Negative.   Musculoskeletal: Negative.   Skin: Negative.   Neurological: Negative.   Endo/Heme/Allergies: Negative.   Psychiatric/Behavioral: Negative.      Objective:   Vitals:   01/26/19 0851  BP: 132/88  Pulse: 67  Resp: 16  SpO2: 99%          Physical Exam Constitutional:      Appearance: She is not diaphoretic.  HENT:     Head: Normocephalic.     Right Ear: Tympanic membrane, ear canal and external ear normal.     Left Ear: Tympanic membrane, ear canal and external ear normal.     Nose: Nose normal. No mucosal edema or rhinorrhea.     Mouth/Throat:     Pharynx: Uvula midline. No oropharyngeal exudate.  Eyes:     Conjunctiva/sclera: Conjunctivae normal.  Neck:     Thyroid: No thyromegaly.     Trachea: Trachea normal. No tracheal tenderness or tracheal deviation.  Cardiovascular:     Rate and Rhythm: Normal rate and regular rhythm.     Heart sounds: Normal heart sounds, S1 normal and S2 normal. No murmur.  Pulmonary:     Effort: No respiratory distress.     Breath sounds: Normal breath sounds. No stridor. No wheezing or rales.  Lymphadenopathy:     Head:     Right side of head: No tonsillar adenopathy.     Left side of head: No tonsillar adenopathy.     Cervical: No cervical adenopathy.  Skin:    Findings: No erythema or rash.     Nails: There is no clubbing.   Neurological:     Mental Status: She is alert.      Diagnostics: none  Assessment and Plan:   1. Other allergic rhinitis   2. Eosinophilic esophagitis   3. LPRD (laryngopharyngeal reflux disease)     1. Continue to avoid all dairy consumption  2. Continue to Treat inflammation:   A. OTC Rhinocort / Nasacort one spray each nostril once a day  B. montelukast 10 mg tablet 1 time per day  C. Prednisone 10mg  - 1 tablet 1 time per day for 5 days only  3. Continue to Treat reflux:   A. Nexium 40 mg tablet in PM  B. continue off all forms of caffeine and chocolate  4. Can add OTC antihistamine - cetirizine   5. Return in 1  year or earlier if problem  6. Consider a course of immunotherapy. Skin Testing?   Memorie appears to have developed a respiratory tract flare most likely secondary to pollen exposure and we will treat her with the therapy noted above which includes a very low dose of systemic steroid to get rid of her respiratory tract inflammation.  I have given her literature on immunotherapy for if she still remains symptomatic in the face of utilizing a leukotriene modifier and a nasal steroid then she needs to consider this form of treatment.  Fortunately, her eosinophilic esophagitis is under excellent control with her dietary manipulation and the use of Nexium.  I will see her back in this clinic in 1 year or earlier if there is a problem.  Allena Katz, MD Allergy / Immunology [place]

## 2019-01-26 NOTE — Patient Instructions (Addendum)
  1. Continue to avoid all dairy consumption  2. Continue to Treat inflammation:   A. OTC Rhinocort / Nasacort one spray each nostril once a day  B. montelukast 10 mg tablet 1 time per day  C. Prednisone 10mg  - 1 tablet 1 time per day for 5 days only  3. Continue to Treat reflux:   A. Nexium 40 mg tablet in PM  B. continue off all forms of caffeine and chocolate  4. Can add OTC antihistamine - cetirizine   5. Return in 1 year or earlier if problem  6. Consider a course of immunotherapy. Skin Testing?

## 2019-01-27 ENCOUNTER — Encounter: Payer: Self-pay | Admitting: Allergy and Immunology

## 2019-01-27 ENCOUNTER — Ambulatory Visit: Payer: Self-pay | Admitting: Allergy and Immunology

## 2019-02-01 DIAGNOSIS — F411 Generalized anxiety disorder: Secondary | ICD-10-CM | POA: Diagnosis not present

## 2019-02-02 ENCOUNTER — Other Ambulatory Visit: Payer: Self-pay

## 2019-02-02 DIAGNOSIS — F419 Anxiety disorder, unspecified: Secondary | ICD-10-CM | POA: Diagnosis not present

## 2019-02-02 MED ORDER — MONTELUKAST SODIUM 10 MG PO TABS: ORAL_TABLET | ORAL | 2 refills | Status: DC

## 2019-02-08 DIAGNOSIS — F411 Generalized anxiety disorder: Secondary | ICD-10-CM | POA: Diagnosis not present

## 2019-02-15 DIAGNOSIS — F411 Generalized anxiety disorder: Secondary | ICD-10-CM | POA: Diagnosis not present

## 2019-02-21 ENCOUNTER — Other Ambulatory Visit: Payer: Self-pay

## 2019-02-21 MED ORDER — MONTELUKAST SODIUM 10 MG PO TABS: ORAL_TABLET | ORAL | 2 refills | Status: DC

## 2019-02-22 DIAGNOSIS — F411 Generalized anxiety disorder: Secondary | ICD-10-CM | POA: Diagnosis not present

## 2019-03-01 DIAGNOSIS — F411 Generalized anxiety disorder: Secondary | ICD-10-CM | POA: Diagnosis not present

## 2019-03-08 ENCOUNTER — Telehealth (INDEPENDENT_AMBULATORY_CARE_PROVIDER_SITE_OTHER): Payer: Federal, State, Local not specified - PPO | Admitting: Gastroenterology

## 2019-03-08 ENCOUNTER — Encounter: Payer: Self-pay | Admitting: Gastroenterology

## 2019-03-08 ENCOUNTER — Other Ambulatory Visit: Payer: Self-pay

## 2019-03-08 VITALS — Ht 62.5 in | Wt 124.0 lb

## 2019-03-08 DIAGNOSIS — K219 Gastro-esophageal reflux disease without esophagitis: Secondary | ICD-10-CM | POA: Diagnosis not present

## 2019-03-08 DIAGNOSIS — R1012 Left upper quadrant pain: Secondary | ICD-10-CM

## 2019-03-08 MED ORDER — HYOSCYAMINE SULFATE 0.125 MG SL SUBL: 0.1250 mg | SUBLINGUAL_TABLET | Freq: Four times a day (QID) | SUBLINGUAL | 2 refills | Status: DC | PRN

## 2019-03-08 MED ORDER — NEXIUM 40 MG PO CPDR: 40.0000 mg | DELAYED_RELEASE_CAPSULE | Freq: Every day | ORAL | 5 refills | Status: DC

## 2019-03-08 MED ORDER — AMBULATORY NON FORMULARY MEDICATION: 2 refills | Status: DC

## 2019-03-08 NOTE — Progress Notes (Signed)
Chief Complaint:   Referring Provider:  Nicoletta Dress, MD      ASSESSMENT AND PLAN;   #1. Abdominal pain LUQ.  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  Rule out diverticulitis.  Has associated constipation.  #2. EoE with GERD/NCCP. Dx 05/2014, treated with PPIs and Flovent, did very well on food restriction.  Subsequent EGD 12/25/2017 with eso Bx- neg.  #3. Epi pain/NCCP.  H/O chole d/t biliary dyskinesia by Dr. Noberto Retort 01/2003. Neg [place] 08/2018  Plan - Dulcolax 1/day x 3 days. - Avoid diary, eggs, potatoes, chocolate and caffeine as suggested by Dr. Neldon Mc. - GI cocktail (equal amounts of Maalox, viscous lidocaine, dicyclomine or Donnatal).  10 cc p.o. every 8 hours as needed. #120 ml. 2 refills ([facility]) - Avoid nonsteroidals. - Patient is to call us if she still has problems. If still with problems, check CBC, CMP and proceed with CT abdo/pelvis. - Levsin 0.125mg  S/L q6 hrs. #120. 2 refills. - Continue Nexium 40 mg p.o. once a day. - Continue liquid diet today.  Start solids tomorrow morning.   HPI:    Sherri Ruiz is a 58 y.o. female   LUQ pain x 2 weeks, moderate to severe, waxing and waning, nonradiating, Worse with constipation Associated with abdominal bloating.  No fever or chills. Gas-X did help a little. She put herself on a liquid diet.  Somewhat better.  She had GI cocktail at home which was expired.   Has voluntarily stopped working in [employer] for now.  No dysphagia.  No significant weight loss. Did lose 1-1/2 pounds.   Does admit that she has been under considerable stress due to family problems (daughters with the grandkids had moved in.  At one point she had 11 people living in her home, she also recently has been promoted in her job)  Past GI Proc: - EGD 05/2014-wide-open cervical esophageal web, mild gastritis, gastric polyps.  Eso Bx- showed increased eosinophils over 20 per HPF c/w eosinophilic esophagitis.  Gastric Bx- reactive gastropathy,  intestinal metaplasia.  Subsequent EGD 12/06/7987-[id number] of eosinophilic esophagitis on biopsies, gastric mapping negative for intestinal metaplasia, small incidental gastric polyps measuring 4 to 6 mm.  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  Otherwise normal to TI.  Next due 09/2023 unless with any new problems. 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 Past Medical History:  Diagnosis Date  . Allergic rhinitis   . Eosinophilic esophagitis   . High blood pressure   . High cholesterol   . Hypothyroidism   . LPRD (laryngopharyngeal reflux disease)   . Penicillin allergy     Past Surgical History:  Procedure Laterality Date  . CHOLECYSTECTOMY    . ESOPHAGOGASTRODUODENOSCOPY  06/16/2014   Wide open cervial esophageal web. Mild gastrtitis. Gastric polyps.   . ESOPHAGOGASTRODUODENOSCOPY  12/25/2017  . OTHER SURGICAL HISTORY     Hysterectomy  . TOTAL ABDOMINAL HYSTERECTOMY      Family History  Problem Relation Age of Onset  . Clotting disorder Mother   . High Cholesterol Mother   . Asthma Mother   . Stroke Mother   . High blood pressure Father   . Heart attack Father [place]  POW.  Atrial fib.    . Cancer Sister   . Asthma Daughter   . Heart disease Brother        Pacemaker  . Colon cancer Neg Hx     Social History   Tobacco Use  . Smoking status: Former Smoker    Types: Cigarettes    Last attempt to quit: 1989    Years since quitting: 31.3  . Smokeless tobacco: Never Used  Substance Use Topics  . Alcohol use: Not Currently    Frequency: Never  . Drug use: Never    Current Outpatient  Medications  Medication Sig Dispense Refill  . ALPRAZolam (XANAX) 0.25 MG tablet Take 0.25 mg by mouth as needed for anxiety.   0  . Ascorbic Acid (VITAMIN C) 500 MG CAPS Take 1 tablet by mouth daily.    Marland Kitchen atorvastatin (LIPITOR) 20 MG tablet Take 20 mg by mouth at bedtime.   1  . bismuth subsalicylate (PEPTO BISMOL) 262 MG chewable tablet Chew 262 mg by mouth as needed.    . Calcium Carbonate Antacid (CALCIUM CARBONATE PO) Take 1 tablet by mouth daily. Chewables    . Cetirizine HCl (ZYRTEC PO) Take 1 tablet by mouth daily.     Marland Kitchen levothyroxine (SYNTHROID, LEVOTHROID) 50 MCG tablet Take 50 mcg by mouth daily before breakfast.     . lisinopril (PRINIVIL,ZESTRIL) 5 MG tablet Take 5 mg by mouth daily.     Marland Kitchen MINIVELLE 0.1 MG/24HR patch Place 1 patch onto the skin 2 (two) times a week.     . montelukast (SINGULAIR) 10 MG tablet Take one tablet by mouth once daily as directed. (Patient taking differently: daily. Take one tablet by mouth once daily as directed.) 90 tablet 2  . Multiple Vitamins-Minerals (HAIR SKIN AND NAILS FORMULA PO) Take 1 tablet by mouth daily.    . Multiple Vitamins-Minerals (MULTIVITAMIN PO) Take by mouth daily.    Marland Kitchen NEXIUM 40 MG capsule Take one capsule daily as directed 30 capsule 5  . Probiotic Product (DIGESTIVE ADVANTAGE PO) Take by mouth daily.    . Simethicone (GAS-X PO) Take 1 tablet by mouth 3 (three) times daily as needed.    . triamcinolone (NASACORT ALLERGY 24HR) 55 MCG/ACT AERO nasal inhaler Place 1 spray into the nose as needed.      No current facility-administered medications for this visit.     Allergies  Allergen Reactions  . Clindamycin/Lincomycin Swelling  . Meperidine Nausea And Vomiting    unknown  . Penicillin G Other (See Comments)    Unknown/childhood allergy Has patient had a PCN reaction causing immediate rash, facial/tongue/throat swelling, SOB or lightheadedness with hypotension: Yes Has patient had a PCN reaction causing severe rash involving  mucus membranes or skin necrosis: Unknown Has patient had a PCN reaction that required hospitalization: No Has patient had a PCN reaction occurring within the last 10 years: Unknown If all of the above answers are "NO", then may proceed with Cephalosporin use.      Review of Systems:  Negative except for HPI     Physical Exam:    Ht 5' 2.5" (1.588 m)   Wt 124 lb (56.2 kg)   LMP 07/20/2018   BMI 22.32 kg/m  Filed Weights   03/08/19 1140  Weight: 124 lb (56.2 kg)   Constitutional:  Well-developed, in no acute distress. Psychiatric: Normal mood and affect. Behavior is normal.   Data Reviewed: I have personally reviewed following labs and imaging studies  CBC:  CBC Latest Ref Rng & Units 07/20/2018 07/20/2018  WBC 4.0 - 10.5 K/uL 4.8 5.3  Hemoglobin 12.0 - 15.0 g/dL 13.8 15.2(H)  Hematocrit 36.0 - 46.0 % 39.8 43.6  Platelets 150 - 400 K/uL 219 239    CMP: CMP Latest Ref Rng & Units 07/20/2018 07/20/2018  Glucose 70 - 99 mg/dL - 99  BUN 6 - 20 mg/dL - 7  Creatinine 0.44 - 1.00 mg/dL 0.81 0.79  Sodium 135 - 145 mmol/L - 141  Potassium 3.5 - 5.1 mmol/L - 3.8  Chloride 98 - 111 mmol/L - 103  CO2 22 - 32 mmol/L - 32  Calcium 8.9 - 10.3 mg/dL - 9.3  This service was provided via telemedicine.  The patient was located at home.  The provider was located in office.  The patient did consent to this Doxy video visit and is aware of possible charges through their insurance for this visit.  Time spent on call and coordination of care: 25 min     Carmell Austria, MD 03/08/2019, 2:35 PM  Cc: Nicoletta Dress, MD

## 2019-03-08 NOTE — Patient Instructions (Signed)
Dulcolax 1/day x 3 days. - Avoid diary, eggs, potatoes, chocolate and caffeine as suggested by Dr. Neldon Mc. - GI cocktail (equal amounts of Maalox, viscous lidocaine, dicyclomine or Donnatal).  10 cc p.o. every 8 hours as needed. #120 ml. 2 refills ([facility]) - Avoid nonsteroidals. - Patient is to call us if she still has problems. If still with problems, check CBC, CMP and proceed with CT abdo/pelvis. - Levsin 0.125mg  S/L q6 hrs. #120. 2 refills. - Continue Nexium 40 mg p.o. once a day. - Continue liquid diet today.  Start solids tomorrow morning.  Thank you,  Dr. Jackquline Denmark

## 2019-03-09 ENCOUNTER — Telehealth: Payer: Self-pay | Admitting: *Deleted

## 2019-03-09 ENCOUNTER — Telehealth: Payer: Self-pay | Admitting: Gastroenterology

## 2019-03-09 NOTE — Telephone Encounter (Signed)
Contacted the patient after receiving clarification from Dr. Lyndel Safe about the patient taking Dulcolax stool softener (docusate sodium) or Dulcolax stimulant laxative (bisacodyl) 1/day x 3 days. Reported to the patient to switch from the docusate sodium to bisacodyl and if still constipated after 3 days of bisacodyl, then to add docusate sodium 1/day and continue. Patient reminded to call on Monday to give an update.

## 2019-03-09 NOTE — Telephone Encounter (Signed)
Spoke with patient who advised RN that she would take the stool softener that she purchased and wait 24 hours and if no stool was produced to her satisfaction, she would take mag citrate. This RN expressed this was not in the plan discussed in the note provided. Patient verbalized understanding. Patient told to follow up on Monday and if her problems persisted, just as in note, blood work and a CT would be arrange.

## 2019-03-09 NOTE — Telephone Encounter (Signed)
Just as in note  1/day x 3 days (just at night) Should take GI cocktail. If not better, proceed with labs and CT as outlined in the note

## 2019-03-09 NOTE — Telephone Encounter (Signed)
Patient called had a virtual visit with Dr. Lyndel Safe yesterday and has medication questions.

## 2019-03-09 NOTE — Telephone Encounter (Signed)
Spoke with patient who is extremely uncomfortable with abd pain and distention. The patient is inquiring about mag citrate and Dulcolax. The patient has purchased Dulcolax stool softeners and is attempting to take q8hrs x 3 days. Per your recommendations, the patient should take Dulcolax 1/day x 3 days. Please clarify if you want the patient to take Dulcolax stool softener or Dulcolax laxative one a day for three days. Please advise.

## 2019-03-09 NOTE — Telephone Encounter (Signed)
The patient needs clarification on whether to take the Dulcolax stool softener or Dulcolax laxative.

## 2019-03-10 NOTE — Telephone Encounter (Signed)
Thanks for letting me know. Dr Lyndel Safe

## 2019-03-15 ENCOUNTER — Telehealth: Payer: Self-pay | Admitting: Gastroenterology

## 2019-03-15 ENCOUNTER — Other Ambulatory Visit: Payer: Self-pay | Admitting: *Deleted

## 2019-03-15 DIAGNOSIS — R1012 Left upper quadrant pain: Secondary | ICD-10-CM

## 2019-03-15 DIAGNOSIS — F411 Generalized anxiety disorder: Secondary | ICD-10-CM | POA: Diagnosis not present

## 2019-03-15 NOTE — Telephone Encounter (Signed)
CORRECTION: Laverda Sorenson from [facility] will call the patient

## 2019-03-15 NOTE — Telephone Encounter (Signed)
Spoke to patient who reports continued abd distention worsened by eating and induced nausea with every meal. The nausea is sometimes relieved with a bowel movement but returns after eating a meal. Denies vomiting. Patient reports after 2 days bisacodyl use (1 per day for 3 days at night), a stool was produced in which the patient describes to have been 1/2 in in diameter and about 10 inches long of soft, formed brown stool. The patient does note that the stool was difficult to evacuate. No diarrhea or loose stool reported after the administration of the biscodyl (Dulcolax), no cramping or abd pain reported, only abd distention and discomfort.   Per Dr. Steve Rattler note, if the patient continued to call with complaints, the patient needed to come in for lab work (appt scheduled for 5/20 at 10:00 am [facility], CMP]) and to have a CT abd/pelvis. Olivia Mackie at [facility] called to schedule imaging (told this RN she would call and schedule with the patient tomorrow, 5/20).

## 2019-03-15 NOTE — Telephone Encounter (Signed)
Returned patient phone call, told patient Sherri Ruiz would call to schedule appt for CT. Notified patient to labs and appt at 10:15 am in [facility].

## 2019-03-15 NOTE — Telephone Encounter (Signed)
Left message on machine to call back  

## 2019-03-15 NOTE — Telephone Encounter (Signed)
Patient is returning your call.  

## 2019-03-15 NOTE — Telephone Encounter (Signed)
Patient called said that she is still not doing well. Would like to speak to someone

## 2019-03-15 NOTE — Telephone Encounter (Signed)
Thanks for letting me know!

## 2019-03-16 ENCOUNTER — Other Ambulatory Visit (INDEPENDENT_AMBULATORY_CARE_PROVIDER_SITE_OTHER): Payer: Federal, State, Local not specified - PPO

## 2019-03-16 ENCOUNTER — Other Ambulatory Visit: Payer: Self-pay

## 2019-03-16 DIAGNOSIS — R1012 Left upper quadrant pain: Secondary | ICD-10-CM

## 2019-03-17 LAB — CBC WITH DIFFERENTIAL/PLATELET
Basophils Absolute: 0 10*3/uL (ref 0.0–0.2)
Basos: 1 %
EOS (ABSOLUTE): 0 10*3/uL (ref 0.0–0.4)
Eos: 1 %
Hematocrit: 41.2 % (ref 34.0–46.6)
Hemoglobin: 13.8 g/dL (ref 11.1–15.9)
Immature Grans (Abs): 0 10*3/uL (ref 0.0–0.1)
Immature Granulocytes: 0 %
Lymphocytes Absolute: 1.5 10*3/uL (ref 0.7–3.1)
Lymphs: 34 %
MCH: 31.7 pg (ref 26.6–33.0)
MCHC: 33.5 g/dL (ref 31.5–35.7)
MCV: 95 fL (ref 79–97)
Monocytes Absolute: 0.3 10*3/uL (ref 0.1–0.9)
Monocytes: 6 %
Neutrophils Absolute: 2.7 10*3/uL (ref 1.4–7.0)
Neutrophils: 58 %
Platelets: 226 10*3/uL (ref 150–450)
RBC: 4.35 x10E6/uL (ref 3.77–5.28)
RDW: 11.8 % (ref 11.7–15.4)
WBC: 4.5 10*3/uL (ref 3.4–10.8)

## 2019-03-17 LAB — COMPREHENSIVE METABOLIC PANEL
ALT: 19 IU/L (ref 0–32)
AST: 20 IU/L (ref 0–40)
Albumin/Globulin Ratio: 2.3 — ABNORMAL HIGH (ref 1.2–2.2)
Albumin: 4.2 g/dL (ref 3.8–4.9)
Alkaline Phosphatase: 48 IU/L (ref 39–117)
BUN/Creatinine Ratio: 5 — ABNORMAL LOW (ref 9–23)
BUN: 4 mg/dL — ABNORMAL LOW (ref 6–24)
Bilirubin Total: 0.4 mg/dL (ref 0.0–1.2)
CO2: 26 mmol/L (ref 20–29)
Calcium: 9.2 mg/dL (ref 8.7–10.2)
Chloride: 98 mmol/L (ref 96–106)
Creatinine, Ser: 0.77 mg/dL (ref 0.57–1.00)
GFR calc Af Amer: 101 mL/min/{1.73_m2} (ref >59)
GFR calc non Af Amer: 87 mL/min/{1.73_m2} (ref >59)
Globulin, Total: 1.8 g/dL (ref 1.5–4.5)
Glucose: 94 mg/dL (ref 65–99)
Potassium: 4.2 mmol/L (ref 3.5–5.2)
Sodium: 135 mmol/L (ref 134–144)
Total Protein: 6 g/dL (ref 6.0–8.5)

## 2019-03-22 DIAGNOSIS — F411 Generalized anxiety disorder: Secondary | ICD-10-CM | POA: Diagnosis not present

## 2019-03-24 ENCOUNTER — Telehealth: Payer: Self-pay | Admitting: *Deleted

## 2019-03-24 NOTE — Telephone Encounter (Signed)

## 2019-03-25 ENCOUNTER — Ambulatory Visit (INDEPENDENT_AMBULATORY_CARE_PROVIDER_SITE_OTHER)
Admission: RE | Admit: 2019-03-25 | Discharge: 2019-03-25 | Disposition: A | Discharge status: Home / Self Care | Payer: Federal, State, Local not specified - PPO | Source: Ambulatory Visit | Attending: Gastroenterology | Admitting: Gastroenterology

## 2019-03-25 ENCOUNTER — Other Ambulatory Visit: Payer: Self-pay

## 2019-03-25 DIAGNOSIS — D1771 Benign lipomatous neoplasm of kidney: Secondary | ICD-10-CM | POA: Diagnosis not present

## 2019-03-25 DIAGNOSIS — K5900 Constipation, unspecified: Secondary | ICD-10-CM | POA: Diagnosis not present

## 2019-03-25 DIAGNOSIS — R1012 Left upper quadrant pain: Secondary | ICD-10-CM

## 2019-03-25 MED ORDER — IOHEXOL 300 MG/ML  SOLN
100.0000 mL | Freq: Once | INTRAMUSCULAR | Status: AC | PRN
  Administered 2019-03-25: 16:00:00 100 mL via INTRAVENOUS

## 2019-03-29 DIAGNOSIS — F411 Generalized anxiety disorder: Secondary | ICD-10-CM | POA: Diagnosis not present

## 2019-04-04 ENCOUNTER — Telehealth: Payer: Self-pay | Admitting: *Deleted

## 2019-04-04 NOTE — Telephone Encounter (Signed)
CT of abd/pelvis report faxed to Dr. Gerarda Fraction, MD [phone]

## 2019-04-04 NOTE — Telephone Encounter (Signed)
Spoke to the patient and notified patient of CT results.   Patient did not want to be scheduled for a MRI at this time stating "I want to receive the bill for this CAT scan before I go further." Patient told to call back when ready to schedule MRI. Nothing further at the time of the phone call.

## 2019-04-05 DIAGNOSIS — F411 Generalized anxiety disorder: Secondary | ICD-10-CM | POA: Diagnosis not present

## 2019-04-08 DIAGNOSIS — D519 Vitamin B12 deficiency anemia, unspecified: Secondary | ICD-10-CM | POA: Diagnosis not present

## 2019-04-08 DIAGNOSIS — R5383 Other fatigue: Secondary | ICD-10-CM | POA: Diagnosis not present

## 2019-04-08 DIAGNOSIS — D509 Iron deficiency anemia, unspecified: Secondary | ICD-10-CM | POA: Diagnosis not present

## 2019-04-12 DIAGNOSIS — F411 Generalized anxiety disorder: Secondary | ICD-10-CM | POA: Diagnosis not present

## 2019-05-26 DIAGNOSIS — R6889 Other general symptoms and signs: Secondary | ICD-10-CM | POA: Diagnosis not present

## 2019-05-26 DIAGNOSIS — J208 Acute bronchitis due to other specified organisms: Secondary | ICD-10-CM | POA: Diagnosis not present

## 2019-05-26 DIAGNOSIS — U071 COVID-19: Secondary | ICD-10-CM | POA: Diagnosis not present

## 2019-05-26 DIAGNOSIS — J029 Acute pharyngitis, unspecified: Secondary | ICD-10-CM | POA: Diagnosis not present

## 2019-05-27 DIAGNOSIS — U071 COVID-19: Secondary | ICD-10-CM | POA: Diagnosis not present

## 2019-07-07 DIAGNOSIS — K080 Exfoliation of teeth due to systemic causes: Secondary | ICD-10-CM | POA: Diagnosis not present

## 2019-07-26 DIAGNOSIS — D225 Melanocytic nevi of trunk: Secondary | ICD-10-CM | POA: Diagnosis not present

## 2019-07-26 DIAGNOSIS — L821 Other seborrheic keratosis: Secondary | ICD-10-CM | POA: Diagnosis not present

## 2019-07-26 DIAGNOSIS — L578 Other skin changes due to chronic exposure to nonionizing radiation: Secondary | ICD-10-CM | POA: Diagnosis not present

## 2019-09-07 DIAGNOSIS — J0100 Acute maxillary sinusitis, unspecified: Secondary | ICD-10-CM | POA: Diagnosis not present

## 2019-09-24 ENCOUNTER — Other Ambulatory Visit: Payer: Self-pay | Admitting: Allergy and Immunology

## 2019-10-03 DIAGNOSIS — M6530 Trigger finger, unspecified finger: Secondary | ICD-10-CM | POA: Diagnosis not present

## 2019-10-03 DIAGNOSIS — M67441 Ganglion, right hand: Secondary | ICD-10-CM | POA: Diagnosis not present

## 2019-10-03 DIAGNOSIS — G4700 Insomnia, unspecified: Secondary | ICD-10-CM | POA: Diagnosis not present

## 2019-10-03 DIAGNOSIS — N951 Menopausal and female climacteric states: Secondary | ICD-10-CM | POA: Diagnosis not present

## 2019-10-19 DIAGNOSIS — B9689 Other specified bacterial agents as the cause of diseases classified elsewhere: Secondary | ICD-10-CM | POA: Diagnosis not present

## 2019-10-19 DIAGNOSIS — J019 Acute sinusitis, unspecified: Secondary | ICD-10-CM | POA: Diagnosis not present

## 2019-11-16 DIAGNOSIS — I10 Essential (primary) hypertension: Secondary | ICD-10-CM | POA: Diagnosis not present

## 2019-11-16 DIAGNOSIS — E039 Hypothyroidism, unspecified: Secondary | ICD-10-CM | POA: Diagnosis not present

## 2019-11-16 DIAGNOSIS — Z1331 Encounter for screening for depression: Secondary | ICD-10-CM | POA: Diagnosis not present

## 2019-11-16 DIAGNOSIS — Z0000 Encounter for general adult medical examination without abnormal findings: Secondary | ICD-10-CM | POA: Diagnosis not present

## 2019-11-16 DIAGNOSIS — E785 Hyperlipidemia, unspecified: Secondary | ICD-10-CM | POA: Diagnosis not present

## 2019-12-05 DIAGNOSIS — F411 Generalized anxiety disorder: Secondary | ICD-10-CM | POA: Diagnosis not present

## 2019-12-09 ENCOUNTER — Other Ambulatory Visit: Payer: Self-pay | Admitting: *Deleted

## 2019-12-09 ENCOUNTER — Telehealth: Payer: Self-pay | Admitting: Allergy and Immunology

## 2019-12-09 MED ORDER — MONTELUKAST SODIUM 10 MG PO TABS: ORAL_TABLET | ORAL | 0 refills | Status: DC

## 2019-12-09 NOTE — Telephone Encounter (Signed)
RX sent

## 2019-12-09 NOTE — Telephone Encounter (Signed)
Montelukast to [facility]

## 2019-12-13 DIAGNOSIS — F411 Generalized anxiety disorder: Secondary | ICD-10-CM | POA: Diagnosis not present

## 2019-12-19 DIAGNOSIS — K5732 Diverticulitis of large intestine without perforation or abscess without bleeding: Secondary | ICD-10-CM | POA: Diagnosis not present

## 2019-12-19 DIAGNOSIS — J029 Acute pharyngitis, unspecified: Secondary | ICD-10-CM | POA: Diagnosis not present

## 2019-12-19 DIAGNOSIS — R112 Nausea with vomiting, unspecified: Secondary | ICD-10-CM | POA: Diagnosis not present

## 2019-12-28 DIAGNOSIS — F411 Generalized anxiety disorder: Secondary | ICD-10-CM | POA: Diagnosis not present

## 2019-12-30 DIAGNOSIS — H2512 Age-related nuclear cataract, left eye: Secondary | ICD-10-CM | POA: Diagnosis not present

## 2020-01-06 ENCOUNTER — Ambulatory Visit: Payer: Federal, State, Local not specified - PPO | Admitting: Gastroenterology

## 2020-01-06 ENCOUNTER — Encounter: Payer: Self-pay | Admitting: Gastroenterology

## 2020-01-06 ENCOUNTER — Other Ambulatory Visit: Payer: Self-pay

## 2020-01-06 VITALS — BP 120/84 | HR 74 | Temp 98.0°F | Ht 62.5 in | Wt 133.4 lb

## 2020-01-06 DIAGNOSIS — K200 Eosinophilic esophagitis: Secondary | ICD-10-CM

## 2020-01-06 DIAGNOSIS — R1012 Left upper quadrant pain: Secondary | ICD-10-CM | POA: Diagnosis not present

## 2020-01-06 DIAGNOSIS — R1013 Epigastric pain: Secondary | ICD-10-CM | POA: Diagnosis not present

## 2020-01-06 MED ORDER — HYOSCYAMINE SULFATE 0.125 MG SL SUBL: 0.1250 mg | SUBLINGUAL_TABLET | Freq: Four times a day (QID) | SUBLINGUAL | 2 refills | Status: DC | PRN

## 2020-01-06 MED ORDER — AMBULATORY NON FORMULARY MEDICATION: 2 refills | Status: DC

## 2020-01-06 NOTE — Patient Instructions (Addendum)
It has been recommended to you by your physician that you have a(n) ECL  completed. Per your request, we did not schedule the procedure(s) today. Please contact our office at [phone] should you decide to have the procedure completed. You will be scheduled for a pre-visit and procedure at that time.  We have sent the following medications to your pharmacy for you to pick up at your convenience: Levisin GI cocktail  Thank you,  Dr. Jackquline Denmark

## 2020-01-06 NOTE — Progress Notes (Signed)
Chief Complaint:   Referring Provider:  Nicoletta Dress, MD      ASSESSMENT AND PLAN;   #1. Intermittent N/V #2. LUQ abdominal pain.  Neg CT AP 02/2019.  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  #3. EoE with GERD/NCCP. Dx 05/2014, treated with PPIs and Flovent, did very well on food restriction.  Subsequent EGD 12/25/2017 with eso Bx- neg. #4. H/O presumed diverticulitis, treated with antibiotics 11/2018.  Plan - Continue nexium 40 mg p.o. QD. -Proceed with EGD/colon with MiraLAX. Discussed risks & benefits. (Risks including rare perforation req laparotomy, bleeding after bx/polypectomy req blood transfusion, rarely missing neoplasms, risks of anesthesia/sedation). Benefits outweigh the risks. Patient agrees to proceed. All the questions were answered. Consent forms given for review. - Avoid diary, eggs, potatoes, chocolate and caffeine as suggested by Dr. Neldon Mc. - GI cocktail (equal amounts of Maalox, viscous lidocaine, dicyclomine or Donnatal).  10 cc p.o. every 8 hours as needed. #120 ml. 2 refills - Avoid nonsteroidals. - Patient is to call us if she still has problems. - Levsin 0.125mg  S/L q6 hrs. #120. 2 refills. - Discussed with patient and patient's husband.  Of note- Got first Covid vaccine 01/05/2020   HPI:    Sherri Ruiz is a 59 y.o. female  Accompanied by her husband Mild left upper quadrant abdominal pain associated with episodic nausea/vomiting/diarrhea without melena or hematochezia.  Seen by Dr. Delena Bali.  Diagnosed clinically with diverticulitis.  She was treated with Cipro and Flagyl with minimal relief.  Could not identify any definite foods which trigger these attacks.  Most recently she has been keeping food diary.  Unfortunately she forgot to bring food diary along.  Last attack occurred after eating chicken nuggets.  GI cocktail did help previously.  She did not try Levsin for these attacks.  No dysphagia.  No weight loss.   No melena or hematochezia.  Stress triggers attacks as well.  Has been seeing a counselor  No sodas, chocolates, chewing gums, artificial sweeteners and candy. No NSAIDs   Does admit that she has been under considerable stress due to family problems (daughters with the grandkids had moved in.  At one point she had 11 people living in her home, she also recently has been promoted in her job)  Past GI Proc: - EGD 05/2014-wide-open cervical esophageal web, mild gastritis, gastric polyps.  Eso Bx- showed increased eosinophils over 20 per HPF c/w eosinophilic esophagitis.  Gastric Bx- reactive gastropathy, intestinal metaplasia.  Subsequent EGD [id number] of eosinophilic esophagitis on biopsies, gastric mapping negative for intestinal metaplasia, small incidental gastric polyps measuring 4 to 6 mm.  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  Otherwise normal to TI.  Next due 09/2023 unless with any new problems. 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 - CT AP 02/2019: IMPRESSION: 1. No acute findings in the abdomen or pelvis. Relatively advanced left colonic diverticulosis without diverticulitis. 2. 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 3. The 16 mm low-density lesion in the anterior right liver may be a complex cysts, but cannot be definitively characterized. MRI of the abdomen with and without contrast recommended to further evaluate. She wanted to hold off on MRI Past Medical History:  Diagnosis Date  . Allergic rhinitis   . Asthma   . 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 Eosinophilic  esophagitis   . GERD (gastroesophageal reflux disease)   . High blood pressure   . High cholesterol   . History of hepatitis A   . Hypothyroidism   . IBS (irritable bowel syndrome)   . IDA (iron deficiency anemia)   . LPRD (laryngopharyngeal reflux disease)   . Penicillin allergy   . Peripheral neuropathy   . Vitamin B12 deficiency anemia     Past Surgical History:  Procedure Laterality Date  . CHOLECYSTECTOMY    . ESOPHAGOGASTRODUODENOSCOPY  06/16/2014   Wide open cervial esophageal web. Mild gastrtitis. Gastric polyps.   . ESOPHAGOGASTRODUODENOSCOPY  12/25/2017  . OTHER SURGICAL HISTORY     Hysterectomy  . TOTAL ABDOMINAL HYSTERECTOMY      Family History  Problem Relation Age of Onset  . Clotting disorder Mother   . High Cholesterol Mother   . Asthma Mother   . Stroke Mother   . High blood pressure Father   . Heart attack Father 3       Page.  Atrial fib.    . Cancer Sister   . Asthma Daughter   . Heart disease Brother        Pacemaker  . Colon cancer Neg Hx     Social History   Tobacco Use  . Smoking status: Former Smoker    Types: Cigarettes    Quit date: 1989    Years since quitting: 32.2  . Smokeless tobacco: Never Used  Substance Use Topics  . Alcohol use: Not Currently  . Drug use: Never    Current Outpatient Medications  Medication Sig Dispense Refill  . ALPRAZolam (XANAX) 0.25 MG tablet Take 0.25 mg by mouth as needed for anxiety.   0  . Ascorbic Acid (VITAMIN C) 500 MG CAPS Take 1 tablet by mouth daily.    Marland Kitchen atorvastatin (LIPITOR) 20 MG tablet Take 20 mg by mouth at bedtime.   1  . bismuth subsalicylate (PEPTO BISMOL) 262 MG chewable tablet Chew 262 mg by mouth as needed.    . Calcium Carbonate Antacid (CALCIUM CARBONATE PO) Take 1 tablet by mouth daily. Chewables    . Cetirizine HCl (ZYRTEC PO) Take 1 tablet by mouth daily.     Marland Kitchen levothyroxine (SYNTHROID, LEVOTHROID) 50 MCG tablet Take 50 mcg by mouth daily before breakfast.     .  lisinopril (PRINIVIL,ZESTRIL) 5 MG tablet Take 5 mg by mouth daily.     Marland Kitchen MINIVELLE 0.1 MG/24HR patch Place 1 patch onto the skin 2 (two) times a week.     . montelukast (SINGULAIR) 10 MG tablet TAKE 1 TABLET ONCE DAILY ASDIRECTED 90 tablet 0  . Multiple Vitamins-Minerals (HAIR SKIN AND NAILS FORMULA PO) Take 1 tablet by mouth daily.    . Multiple Vitamins-Minerals (MULTIVITAMIN PO) Take by mouth daily.    Marland Kitchen NEXIUM 40 MG capsule Take 1 capsule (40 mg total) by mouth daily. 30 capsule 5  . Probiotic Product (DIGESTIVE ADVANTAGE PO) Take by mouth daily.    . Simethicone (GAS-X PO) Take 1 tablet by mouth 3 (three) times daily as needed.    . AMBULATORY NON FORMULARY MEDICATION Medication Name: GI Cocktail (equal amounts of Maalox, Viscous Lidocaine, Dicyclomine) 10 cc every 8 hours as needed. (Patient not taking: Reported on 01/06/2020) 120 mL 2  . hyoscyamine (LEVSIN SL) 0.125 MG SL tablet Place 1  tablet (0.125 mg total) under the tongue every 6 (six) hours as needed. (Patient not taking: Reported on 01/06/2020) 120 tablet 2  . triamcinolone (NASACORT ALLERGY 24HR) 55 MCG/ACT AERO nasal inhaler Place 1 spray into the nose as needed.      No current facility-administered medications for this visit.    Allergies  Allergen Reactions  . Clindamycin/Lincomycin Swelling  . Meperidine Nausea And Vomiting    unknown  . Penicillin G Other (See Comments)    Unknown/childhood allergy Has patient had a PCN reaction causing immediate rash, facial/tongue/throat swelling, SOB or lightheadedness with hypotension: Yes Has patient had a PCN reaction causing severe rash involving mucus membranes or skin necrosis: Unknown Has patient had a PCN reaction that required hospitalization: No Has patient had a PCN reaction occurring within the last 10 years: Unknown If all of the above answers are "NO", then may proceed with Cephalosporin use.      Review of Systems:  Negative except for HPI     Physical Exam:     BP 120/84   Pulse 74   Ht 5' 2.5" (1.588 m)   Wt 133 lb 6 oz (60.5 kg)   LMP 07/20/2018   BMI 24.01 kg/m  Filed Weights   01/06/20 1502  Weight: 133 lb 6 oz (60.5 kg)   Constitutional:  Well-developed, in no acute distress. Psychiatric: Normal mood and affect. Behavior is normal. HEENT: Pupils normal.  Conjunctivae are normal. No scleral icterus. Neck supple.  Cardiovascular: Normal rate, regular rhythm. No edema Pulmonary/chest: Effort normal and breath sounds normal. No wheezing, rales or rhonchi. Abdominal: Soft, nondistended.  Mild epigastric/left upper quadrant tenderness. Bowel sounds active throughout. There are no masses palpable. No hepatomegaly. Rectal:  defered Neurological: Alert and oriented to person place and time. Skin: Skin is warm and dry. No rashes noted.  Data Reviewed: I have personally reviewed following labs and imaging studies  CBC: CBC Latest Ref Rng & Units 03/16/2019 07/20/2018 07/20/2018  WBC 3.4 - 10.8 x10E3/uL 4.5 4.8 5.3  Hemoglobin 11.1 - 15.9 g/dL 13.8 13.8 15.2(H)  Hematocrit 34.0 - 46.6 % 41.2 39.8 43.6  Platelets 150 - 450 x10E3/uL 226 219 239    CMP: CMP Latest Ref Rng & Units 03/16/2019 07/20/2018 07/20/2018  Glucose 65 - 99 mg/dL 94 - 99  BUN 6 - 24 mg/dL 4(L) - 7  Creatinine 0.57 - 1.00 mg/dL 0.77 0.81 0.79  Sodium 134 - 144 mmol/L 135 - 141  Potassium 3.5 - 5.2 mmol/L 4.2 - 3.8  Chloride 96 - 106 mmol/L 98 - 103  CO2 20 - 29 mmol/L 26 - 32  Calcium 8.7 - 10.2 mg/dL 9.2 - 9.3  Total Protein 6.0 - 8.5 g/dL 6.0 - -  Total Bilirubin 0.0 - 1.2 mg/dL 0.4 - -  Alkaline Phos 39 - 117 IU/L 48 - -  AST 0 - 40 IU/L 20 - -  ALT 0 - 32 IU/L 19 - -      Carmell Austria, MD 01/06/2020, 3:20 PM  Cc: Nicoletta Dress, MD

## 2020-01-25 ENCOUNTER — Ambulatory Visit (INDEPENDENT_AMBULATORY_CARE_PROVIDER_SITE_OTHER): Payer: Federal, State, Local not specified - PPO | Admitting: Allergy and Immunology

## 2020-01-25 ENCOUNTER — Encounter: Payer: Self-pay | Admitting: Allergy and Immunology

## 2020-01-25 ENCOUNTER — Other Ambulatory Visit: Payer: Self-pay

## 2020-01-25 VITALS — BP 122/84 | HR 88 | Temp 98.7°F | Resp 20 | Ht 63.0 in | Wt 132.4 lb

## 2020-01-25 DIAGNOSIS — R1115 Cyclical vomiting syndrome unrelated to migraine: Secondary | ICD-10-CM | POA: Diagnosis not present

## 2020-01-25 DIAGNOSIS — R198 Other specified symptoms and signs involving the digestive system and abdomen: Secondary | ICD-10-CM

## 2020-01-25 DIAGNOSIS — K219 Gastro-esophageal reflux disease without esophagitis: Secondary | ICD-10-CM | POA: Diagnosis not present

## 2020-01-25 DIAGNOSIS — K200 Eosinophilic esophagitis: Secondary | ICD-10-CM | POA: Diagnosis not present

## 2020-01-25 DIAGNOSIS — J3089 Other allergic rhinitis: Secondary | ICD-10-CM

## 2020-01-25 DIAGNOSIS — R197 Diarrhea, unspecified: Secondary | ICD-10-CM

## 2020-01-25 DIAGNOSIS — F411 Generalized anxiety disorder: Secondary | ICD-10-CM | POA: Diagnosis not present

## 2020-01-25 MED ORDER — COLESTIPOL HCL 1 G PO TABS: 1.0000 g | ORAL_TABLET | Freq: Two times a day (BID) | ORAL | 0 refills | Status: DC

## 2020-01-25 NOTE — Progress Notes (Signed)
[place] [facility] [place] [facility] [place]   Follow-up Note  Referring Provider: Nicoletta Dress, MD Primary Provider: Nicoletta Dress, MD Date of Office Visit: 01/25/2020  Subjective:   Sherri Ruiz (DOB: 29-May-1961) is a 59 y.o. female who returns to the [facility] [place] on 01/25/2020 in re-evaluation of the following:  HPI: Sherri Ruiz presents to this clinic in reevaluation of allergic rhinitis and LPR and history of eosinophilic esophagitis.  I last saw her in this clinic on 26 January 2019.  She has been doing well regarding her airway issue while using montelukast and recently just starting her nasal steroid.  She has not required a systemic steroid or antibiotic for any type of airway issue.  Overall she is pleased with the response she has been receiving utilizing this form of treatment.  She has been having lots of GI issues.  When I last saw her in this clinic she had eliminated dairy from her diet and continued on a proton pump inhibitor in the treatment of her eosinophilic esophagitis and was doing very well.  However, sometime in the late spring 2020 she started to develop recurrent episodes of diarrhea and vomiting and cramping and pain at least 1 time per week.  She has been evaluated by Dr. Lyndel Safe who has treated her with dicyclomine which has not helped her.  She has now eliminated egg and chocolate and most fats in addition to her dairy avoidance diet over the course of the past 2 weeks.  She is scheduled for a colonoscopy and upper endoscopy at the end of April 2021.  She has received the flu vaccine and has received her first [place].  Allergies as of 01/25/2020      Reactions   Clindamycin/lincomycin Swelling   Meperidine Nausea And Vomiting   unknown   Penicillin G Other (See Comments)   Unknown/childhood allergy Has patient had a PCN reaction causing immediate rash, facial/tongue/throat swelling, SOB or lightheadedness with  hypotension: Yes Has patient had a PCN reaction causing severe rash involving mucus membranes or skin necrosis: Unknown Has patient had a PCN reaction that required hospitalization: No Has patient had a PCN reaction occurring within the last 10 years: Unknown If all of the above answers are "NO", then may proceed with Cephalosporin use.      Medication List      ALPRAZolam 0.25 MG tablet Commonly known as: XANAX Take 0.25 mg by mouth as needed for anxiety.   AMBULATORY NON FORMULARY MEDICATION Medication Name: GI Cocktail (equal amounts of Maalox, Viscous Lidocaine, Dicyclomine) 10 cc every 8 hours as needed.   atorvastatin 20 MG tablet Commonly known as: LIPITOR Take 20 mg by mouth at bedtime.   bismuth subsalicylate 99991111 MG chewable tablet Commonly known as: PEPTO BISMOL Chew 262 mg by mouth as needed.   Butalbital-APAP-Caffeine 50-325-40 MG capsule   CALCIUM CARBONATE PO Take 1 tablet by mouth daily. Chewables   DIGESTIVE ADVANTAGE PO Take by mouth daily.   GAS-X PO Take 1 tablet by mouth 3 (three) times daily as needed.   HAIR SKIN AND NAILS FORMULA PO Take 1 tablet by mouth daily.   hyoscyamine 0.125 MG SL tablet Commonly known as: LEVSIN SL Place 1 tablet (0.125 mg total) under the tongue every 6 (six) hours as needed.   levothyroxine 50 MCG tablet Commonly known as: SYNTHROID Take 50 mcg by mouth daily before breakfast.   lisinopril 5 MG tablet Commonly known as: ZESTRIL  Take 5 mg by mouth daily.   Minivelle 0.1 MG/24HR patch Generic drug: estradiol Place 1 patch onto the skin 2 (two) times a week.   montelukast 10 MG tablet Commonly known as: SINGULAIR TAKE 1 TABLET ONCE DAILY ASDIRECTED   MULTIVITAMIN PO Take by mouth daily.   Nasacort Allergy 24HR 55 MCG/ACT Aero nasal inhaler Generic drug: triamcinolone Place 1 spray into the nose as needed.   NexIUM 40 MG capsule Generic drug: esomeprazole Take 1 capsule (40 mg total) by mouth daily.     Vitamin C 500 MG Caps Take 1 tablet by mouth daily.   ZYRTEC PO Take 1 tablet by mouth daily.       Past Medical History:  Diagnosis Date  . Allergic rhinitis   . Asthma   . 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 Eosinophilic esophagitis   . GERD (gastroesophageal reflux disease)   . High blood pressure   . High cholesterol   . History of hepatitis A   . Hypothyroidism   . IBS (irritable bowel syndrome)   . IDA (iron deficiency anemia)   . LPRD (laryngopharyngeal reflux disease)   . Penicillin allergy   . Peripheral neuropathy   . Vitamin B12 deficiency anemia     Past Surgical History:  Procedure Laterality Date  . CHOLECYSTECTOMY    . ESOPHAGOGASTRODUODENOSCOPY  06/16/2014   Wide open cervial esophageal web. Mild gastrtitis. Gastric polyps.   . ESOPHAGOGASTRODUODENOSCOPY  12/25/2017  . OTHER SURGICAL HISTORY     Hysterectomy  . TOTAL ABDOMINAL HYSTERECTOMY      Review of systems negative except as noted in HPI / PMHx or noted below:  Review of Systems  Constitutional: Negative.   HENT: Negative.   Eyes: Negative.   Respiratory: Negative.   Cardiovascular: Negative.   Gastrointestinal: Negative.   Genitourinary: Negative.   Musculoskeletal: Negative.   Skin: Negative.   Neurological: Negative.   Endo/Heme/Allergies: Negative.   Psychiatric/Behavioral: Negative.      Objective:   Vitals:   01/25/20 0844  BP: 122/84  Pulse: 88  Resp: 20  Temp: 98.7 F (37.1 C)  SpO2: 98%   Height: 5\' 3"  (160 cm)  Weight: 132 lb 6.4 oz (60.1 kg)   Physical Exam Constitutional:      Appearance: She is not diaphoretic.  HENT:     Head: Normocephalic.     Right Ear: Tympanic membrane, ear canal and external ear normal.     Left Ear: Tympanic membrane, ear canal and external ear normal.     Nose: Nose normal. No mucosal edema or rhinorrhea.     Mouth/Throat:     Pharynx: Uvula midline. No oropharyngeal exudate.  Eyes:     Conjunctiva/sclera:  Conjunctivae normal.  Neck:     Thyroid: No thyromegaly.     Trachea: Trachea normal. No tracheal tenderness or tracheal deviation.  Cardiovascular:     Rate and Rhythm: Normal rate and regular rhythm.     Heart sounds: Normal heart sounds, S1 normal and S2 normal. No murmur.  Pulmonary:     Effort: No respiratory distress.     Breath sounds: Normal breath sounds. No stridor. No wheezing or rales.  Lymphadenopathy:     Head:     Right side of head: No tonsillar adenopathy.     Left side of head: No tonsillar adenopathy.     Cervical: No cervical adenopathy.  Skin:    Findings: No erythema or rash.     Nails:  There is no clubbing.  Neurological:     Mental Status: She is alert.     Diagnostics: none  Assessment and Plan:   1. Other allergic rhinitis   2. Eosinophilic esophagitis   3. LPRD (laryngopharyngeal reflux disease)   4. Cyclical vomiting syndrome not associated with migraine   5. Diarrhea, unspecified type   6. [place]     1. Continue to avoid all dairy consumption.  Consider FFED diet.  2. Continue to Treat inflammation:   A. OTC Rhinocort / Nasacort one spray each nostril once a day  B. montelukast 10 mg tablet 1 time per day  3. Continue to Treat reflux:   A. Nexium 40 mg tablet in PM  4.  Can try colestipol 1 g - 1 tablet twice a day for the next 2 weeks  5.  Can add OTC antihistamine - cetirizine - if needed  6.  Blood - CBC w/D, Celiac screen w/IgA, tryptase  7.  Return to clinic for skin testing without antihistamine use.   Sherri Ruiz is doing quite well from a respiratory standpoint but her GI issue is very active.  Whether this is secondary to eosinophilic esophagitis or irritable bowel syndrome or some other process is not entirely clear.  I will check a few blood test looking for some unusual forms of GI malfunction and I have given her colestipol to bind the bile acids in her gut which may be causing some degree of irritation of her GI mucosa  especially given the fact that she has had a cholecystectomy performed in the past.  She would like to be skin tested looking for food hypersensitivity as well and we will get that arranged sometime in the near future.  Allena Katz, MD Allergy / Immunology [place]

## 2020-01-25 NOTE — Patient Instructions (Addendum)
  1. Continue to avoid all dairy consumption.  Consider FFED diet.  2. Continue to Treat inflammation:   A. OTC Rhinocort / Nasacort one spray each nostril once a day  B. montelukast 10 mg tablet 1 time per day  3. Continue to Treat reflux:   A. Nexium 40 mg tablet in PM  4.  Can try colestipol 1 g - 1 tablet twice a day for the next 2 weeks  5.  Can add OTC antihistamine - cetirizine - if needed  6.  Blood - CBC w/D, Celiac screen w/IgA, tryptase  7.  Return to clinic for skin testing without antihistamine use.

## 2020-01-26 ENCOUNTER — Ambulatory Visit: Payer: Self-pay | Admitting: Allergy and Immunology

## 2020-01-26 ENCOUNTER — Encounter: Payer: Self-pay | Admitting: Allergy and Immunology

## 2020-02-02 DIAGNOSIS — F411 Generalized anxiety disorder: Secondary | ICD-10-CM | POA: Diagnosis not present

## 2020-02-05 IMAGING — US US ABDOMEN COMPLETE
1 series · 13 of 25 positions shown · non-contrast
Comparison: CT abdomen and pelvis October 08, 2017

CLINICAL DATA: Epigastric region pain

EXAM:
ABDOMEN ULTRASOUND COMPLETE

[Series 1: us abdomen complete · 0.18mm/px · 13 of 121 slices shown]
[im 1/121]
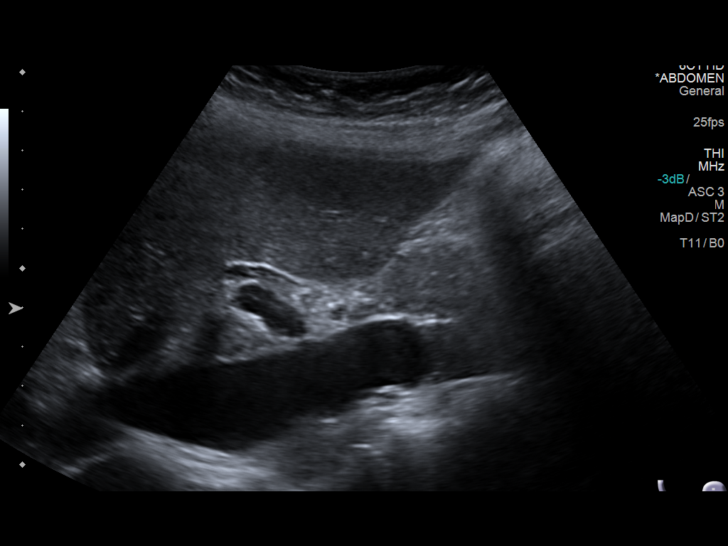
[im 11/121]
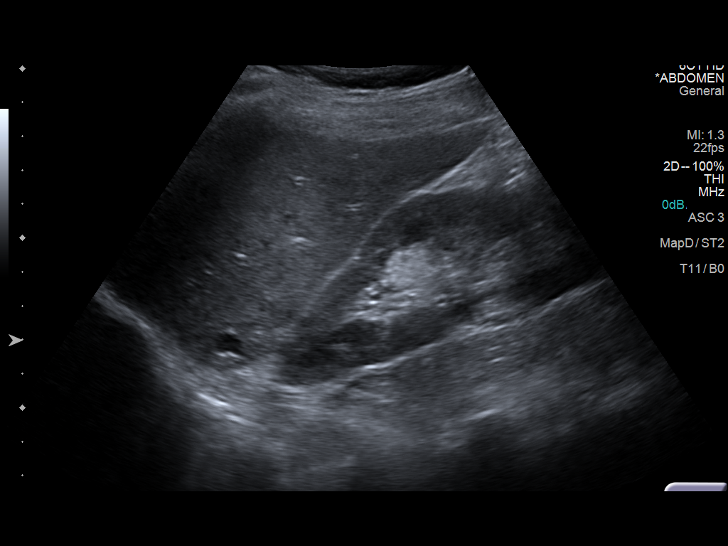
[im 21/121]
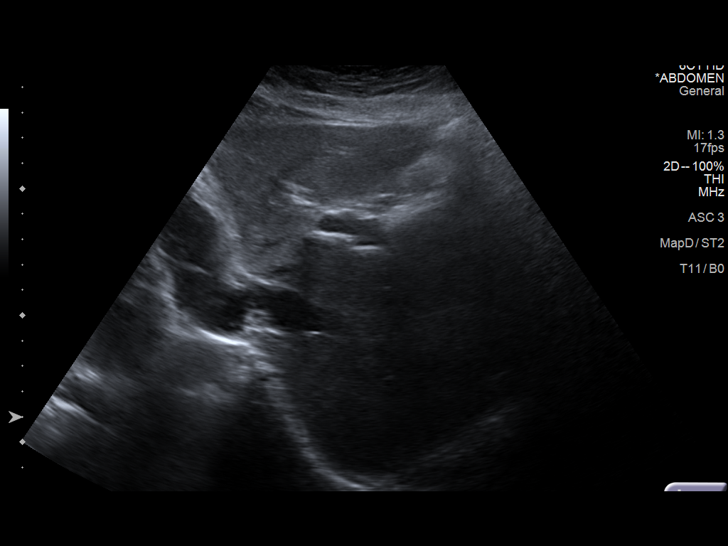
[im 31/121]
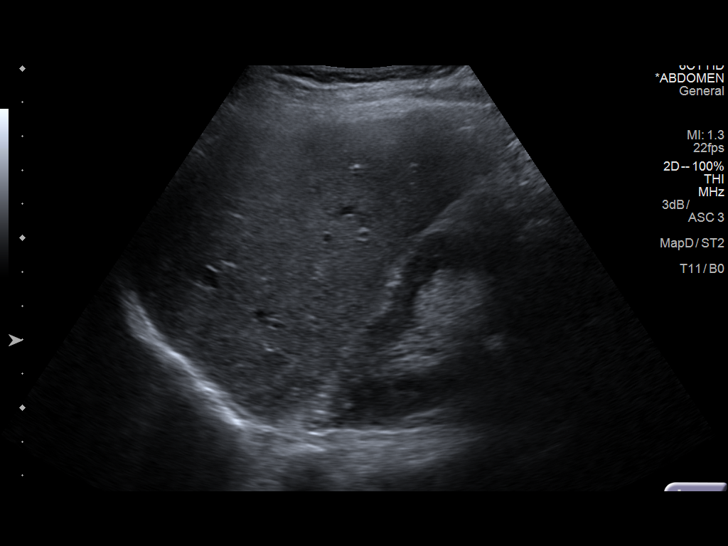
[im 41/121]
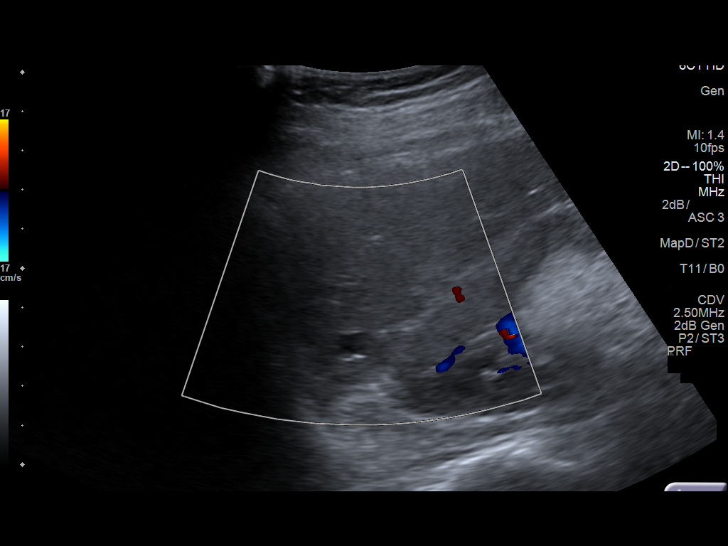
[im 51/121]
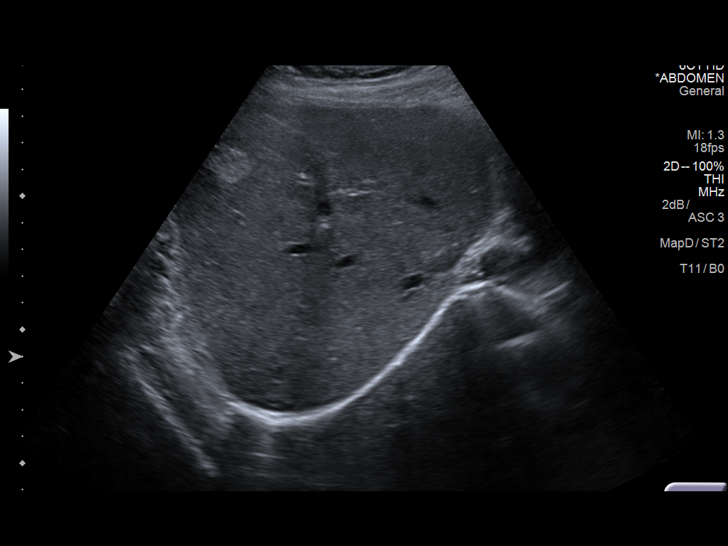
[im 61/121]
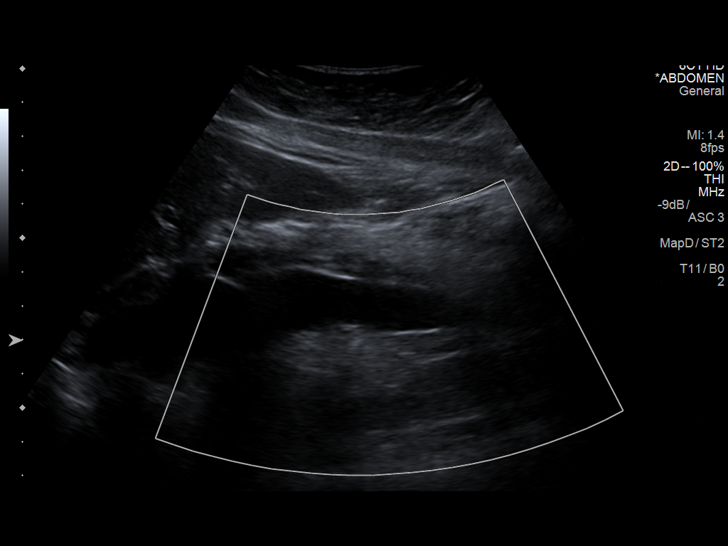
[im 71/121]
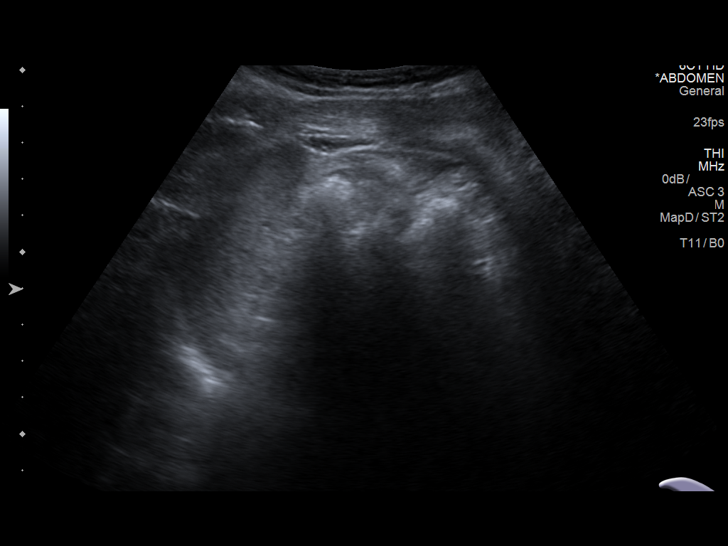
[im 81/121]
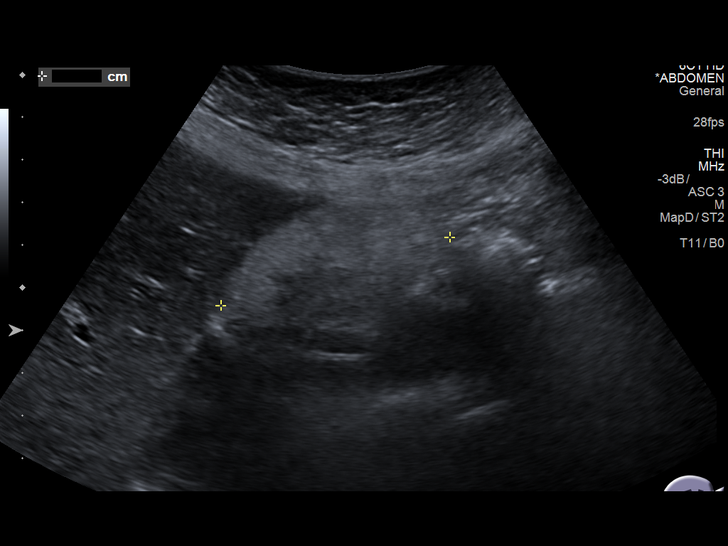
[im 91/121]
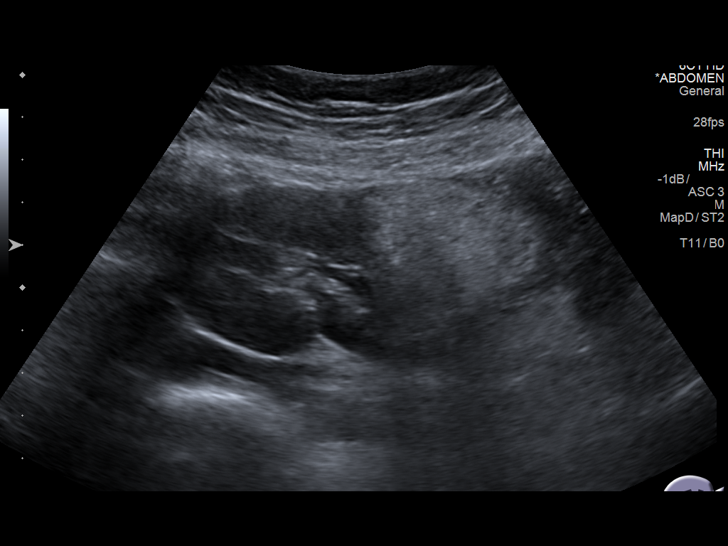
[im 101/121]
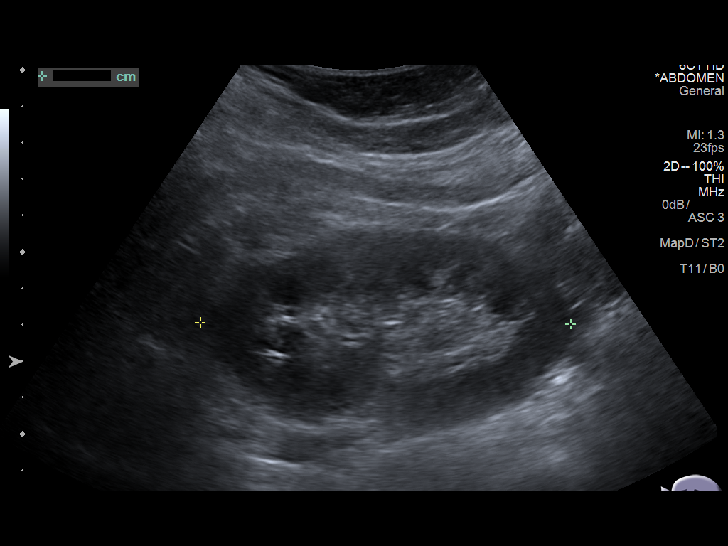
[im 111/121]
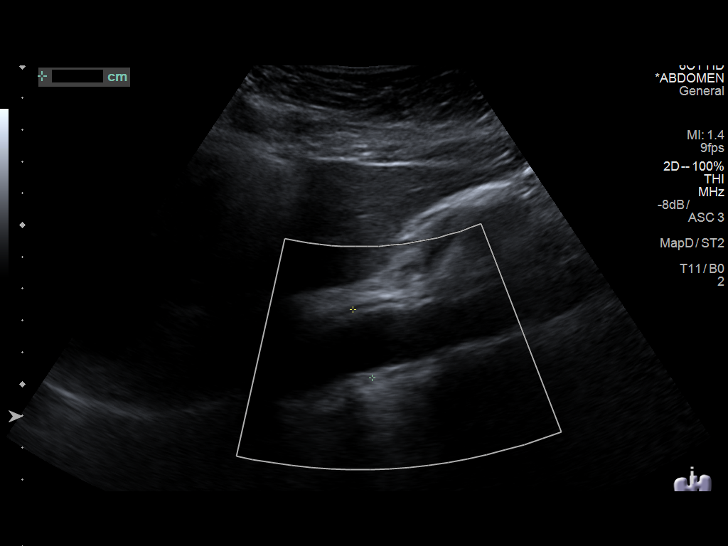
[im 121/121]
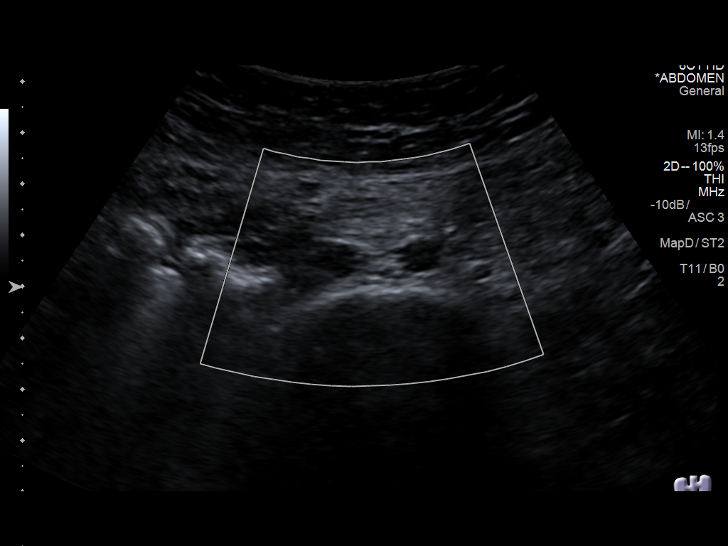

[13 of 25 positions shown; findings below may reference images not displayed]

FINDINGS: Gallbladder: Surgically absent. No lesion evident in the gallbladder
fossa region.

Common bile duct: Diameter: 4 mm. No intrahepatic, common hepatic,
or common bile duct dilatation appreciable on this study.

Liver: There is a hyperechoic mass in the anterior segment of the
right lobe of the liver measuring 1.5 x 1.5 x 1.7 cm. A second
hyperechoic mass in the anterior segment right lobe of the liver
measures 1.4 x 1.4 x 1 point 4 cm. There are small cystic areas in
the liver as well. In the posterior segment right lobe, there is a
1.1 x 0.6 x 1.1 cm cyst. There is a cyst in the midportion of the
right lobe of the liver measuring 1.4 x 1.4 x 1.4 cm. A cyst near
the a patter renal fossa measures 1.1 x 1.1 x 0.6 cm. Within normal
limits in parenchymal echogenicity. Portal vein is patent on color
Doppler imaging with normal direction of blood flow towards the
liver.

IVC: No abnormality visualized.

Pancreas: No pancreatic mass or inflammatory focus.

Spleen: Size and appearance within normal limits.

Right Kidney: Length: 11.1 cm. Echogenicity within normal limits. No
hydronephrosis visualized. There is a hyperechoic mass arising from
the right kidney anteriorly and medially measuring 5.6 x 3.1 x
cm.

Left Kidney: Length: 10.2 cm. Echogenicity within normal limits. No
mass or hydronephrosis visualized.

Abdominal aorta: No aneurysm visualized.

Other findings: No demonstrable ascites.
IMPRESSION: 1. Sizable apparent angiomyolipoma arising from the anteromedial
right kidney, also noted on previous CT and felt to be essentially
stable allowing for differences in modality.

2.  Cysts and small hemangiomas noted within the liver.

3.  Gallbladder absent.

4.  Study otherwise unremarkable.

## 2020-02-13 DIAGNOSIS — Z01419 Encounter for gynecological examination (general) (routine) without abnormal findings: Secondary | ICD-10-CM | POA: Diagnosis not present

## 2020-02-13 DIAGNOSIS — K200 Eosinophilic esophagitis: Secondary | ICD-10-CM | POA: Diagnosis not present

## 2020-02-13 DIAGNOSIS — Z0000 Encounter for general adult medical examination without abnormal findings: Secondary | ICD-10-CM | POA: Diagnosis not present

## 2020-02-13 DIAGNOSIS — R197 Diarrhea, unspecified: Secondary | ICD-10-CM | POA: Diagnosis not present

## 2020-02-13 DIAGNOSIS — Z1231 Encounter for screening mammogram for malignant neoplasm of breast: Secondary | ICD-10-CM | POA: Diagnosis not present

## 2020-02-13 DIAGNOSIS — J3089 Other allergic rhinitis: Secondary | ICD-10-CM | POA: Diagnosis not present

## 2020-02-15 ENCOUNTER — Ambulatory Visit: Payer: Federal, State, Local not specified - PPO | Admitting: Allergy and Immunology

## 2020-02-15 LAB — CBC WITH DIFFERENTIAL
Basophils Absolute: 0 10*3/uL (ref 0.0–0.2)
Basos: 1 %
EOS (ABSOLUTE): 0.1 10*3/uL (ref 0.0–0.4)
Eos: 2 %
Hematocrit: 39.5 % (ref 34.0–46.6)
Hemoglobin: 13.5 g/dL (ref 11.1–15.9)
Immature Grans (Abs): 0 10*3/uL (ref 0.0–0.1)
Immature Granulocytes: 0 %
Lymphocytes Absolute: 1.7 10*3/uL (ref 0.7–3.1)
Lymphs: 30 %
MCH: 31.8 pg (ref 26.6–33.0)
MCHC: 34.2 g/dL (ref 31.5–35.7)
MCV: 93 fL (ref 79–97)
Monocytes Absolute: 0.4 10*3/uL (ref 0.1–0.9)
Monocytes: 7 %
Neutrophils Absolute: 3.3 10*3/uL (ref 1.4–7.0)
Neutrophils: 60 %
RBC: 4.25 x10E6/uL (ref 3.77–5.28)
RDW: 12.6 % (ref 11.7–15.4)
WBC: 5.5 10*3/uL (ref 3.4–10.8)

## 2020-02-15 LAB — CELIAC PANEL 10
Antigliadin Abs, IgA: 2 units (ref 0–19)
Endomysial IgA: NEGATIVE
Gliadin IgG: 3 units (ref 0–19)
IgA/Immunoglobulin A, Serum: 161 mg/dL (ref 87–352)
Tissue Transglut Ab: <2 U/mL (ref 0–5)
Transglutaminase IgA: <2 U/mL (ref 0–3)

## 2020-02-15 LAB — TRYPTASE: Tryptase: 5.1 ug/L (ref 2.2–13.2)

## 2020-02-16 DIAGNOSIS — F411 Generalized anxiety disorder: Secondary | ICD-10-CM | POA: Diagnosis not present

## 2020-02-29 ENCOUNTER — Encounter: Payer: Self-pay | Admitting: Gastroenterology

## 2020-03-07 ENCOUNTER — Ambulatory Visit (AMBULATORY_SURGERY_CENTER): Payer: Self-pay | Admitting: *Deleted

## 2020-03-07 ENCOUNTER — Ambulatory Visit: Payer: Federal, State, Local not specified - PPO | Admitting: Allergy and Immunology

## 2020-03-07 ENCOUNTER — Other Ambulatory Visit: Payer: Self-pay

## 2020-03-07 VITALS — Temp 97.3°F | Ht 63.0 in | Wt 133.0 lb

## 2020-03-07 DIAGNOSIS — K200 Eosinophilic esophagitis: Secondary | ICD-10-CM

## 2020-03-07 DIAGNOSIS — R1012 Left upper quadrant pain: Secondary | ICD-10-CM

## 2020-03-07 DIAGNOSIS — R1013 Epigastric pain: Secondary | ICD-10-CM

## 2020-03-07 NOTE — Progress Notes (Signed)
No egg or soy allergy known to patient - off eggs per Lyndel Safe  No issues with past sedation with any surgeries  or procedures, no intubation problems  No diet pills per patient No home 02 use per patient  No blood thinners per patient  Pt denies issues with constipation  No A fib or A flutter  EMMI video sent to pt's e mail   DAd 97.1 I temp n PV   02-02-2020 comp. covid vaccines   Due to the COVID-19 pandemic we are asking patients to follow these guidelines. Please only bring one care partner. Please be aware that your care partner may wait in the car in the parking lot or if they feel like they will be too hot to wait in the car, they may wait in the lobby on the 4th floor. All care partners are required to wear a mask the entire time (we do not have any that we can provide them), they need to practice social distancing, and we will do a Covid check for all patient's and care partners when you arrive. Also we will check their temperature and your temperature. If the care partner waits in their car they need to stay in the parking lot the entire time and we will call them on their cell phone when the patient is ready for discharge so they can bring the car to the front of the building. Also all patient's will need to wear a mask into building.

## 2020-03-21 ENCOUNTER — Other Ambulatory Visit: Payer: Self-pay

## 2020-03-21 ENCOUNTER — Ambulatory Visit (AMBULATORY_SURGERY_CENTER): Payer: Federal, State, Local not specified - PPO | Admitting: Gastroenterology

## 2020-03-21 ENCOUNTER — Encounter: Payer: Self-pay | Admitting: Gastroenterology

## 2020-03-21 VITALS — BP 136/96 | HR 66 | Temp 96.2°F | Resp 13 | Ht 63.0 in | Wt 133.0 lb

## 2020-03-21 DIAGNOSIS — K2950 Unspecified chronic gastritis without bleeding: Secondary | ICD-10-CM

## 2020-03-21 DIAGNOSIS — K317 Polyp of stomach and duodenum: Secondary | ICD-10-CM | POA: Diagnosis not present

## 2020-03-21 DIAGNOSIS — K200 Eosinophilic esophagitis: Secondary | ICD-10-CM

## 2020-03-21 DIAGNOSIS — D125 Benign neoplasm of sigmoid colon: Secondary | ICD-10-CM

## 2020-03-21 DIAGNOSIS — K209 Esophagitis, unspecified without bleeding: Secondary | ICD-10-CM | POA: Diagnosis not present

## 2020-03-21 DIAGNOSIS — K5730 Diverticulosis of large intestine without perforation or abscess without bleeding: Secondary | ICD-10-CM | POA: Diagnosis not present

## 2020-03-21 DIAGNOSIS — R1012 Left upper quadrant pain: Secondary | ICD-10-CM

## 2020-03-21 DIAGNOSIS — R109 Unspecified abdominal pain: Secondary | ICD-10-CM

## 2020-03-21 DIAGNOSIS — K635 Polyp of colon: Secondary | ICD-10-CM

## 2020-03-21 DIAGNOSIS — K219 Gastro-esophageal reflux disease without esophagitis: Secondary | ICD-10-CM | POA: Diagnosis not present

## 2020-03-21 DIAGNOSIS — K649 Unspecified hemorrhoids: Secondary | ICD-10-CM

## 2020-03-21 MED ORDER — SODIUM CHLORIDE 0.9 % IV SOLN: 500.0000 mL | INTRAVENOUS | Status: DC

## 2020-03-21 NOTE — Progress Notes (Signed)
Vs CW I have reviewed the patient's medical history in detail and updated the computerized patient record.   

## 2020-03-21 NOTE — Op Note (Signed)
[place] Patient Name: Sherri Ruiz Procedure Date: 03/21/2020 10:11 AM MRN: [id number] Endoscopist: Jackquline Denmark , MD Age: 59 Referring MD:  Date of Birth: April 01, 1961 Gender: Female Account #: [id number] Procedure:                Upper GI endoscopy Indications:              #1. Intermittent N/V. No dysphagia.                           #2. LUQ abdominal pain. Neg CT AP 02/2019.                           #3. H/O EoE with GERD/NCCP. Dx 05/2014, treated with                            PPIs and Flovent, did very well on food restriction. Medicines:                Monitored Anesthesia Care Procedure:                Pre-Anesthesia Assessment:                           - Prior to the procedure, a History and Physical                            was performed, and patient medications and                            allergies were reviewed. The patient's tolerance of                            previous anesthesia was also reviewed. The risks                            and benefits of the procedure and the sedation                            options and risks were discussed with the patient.                            All questions were answered, and informed consent                            was obtained. Prior Anticoagulants: The patient has                            taken no previous anticoagulant or antiplatelet                            agents. ASA Grade Assessment: II - A patient with                            mild systemic disease. After reviewing the risks  and benefits, the patient was deemed in                            satisfactory condition to undergo the procedure.                           - Prior to the procedure, a History and Physical                            was performed, and patient medications and                            allergies were reviewed. The patient's tolerance of                            previous anesthesia was also  reviewed. The risks                            and benefits of the procedure and the sedation                            options and risks were discussed with the patient.                            All questions were answered, and informed consent                            was obtained. Prior Anticoagulants: The patient has                            taken no previous anticoagulant or antiplatelet                            agents. ASA Grade Assessment: II - A patient with                            mild systemic disease. After reviewing the risks                            and benefits, the patient was deemed in                            satisfactory condition to undergo the procedure.                           After obtaining informed consent, the endoscope was                            passed under direct vision. Throughout the                            procedure, the patient's blood pressure, pulse, and  oxygen saturations were monitored continuously. The                            Endoscope was introduced through the mouth, and                            advanced to the second part of duodenum. The upper                            GI endoscopy was accomplished without difficulty.                            The patient tolerated the procedure well. Scope In: Scope Out: Findings:                 The examined esophagus was normal except for few                            circular nonobstructive rings in distal esophagus                            just above the GE junction at 38 cm. Biopsies were                            obtained from the proximal and distal esophagus                            with cold forceps for histology of suspected                            eosinophilic esophagitis. Esophagus was not dilated                            since there was no obstructive lesions/symptoms and                            patient preference to hold off.                            A small transient hiatal hernia was present.                           Multiple (15-20) 6 to 8 mm semi-sessile polyps with                            no bleeding and no stigmata of recent bleeding were                            found in the cardia, in the gastric fundus and in                            the gastric body. Biopsies were taken with a cold  forceps for histology.                           Localized mild inflammation characterized by                            erythema was found in the gastric antrum. Biopsies                            were taken with a cold forceps for histology.                           The examined duodenum was normal. Complications:            No immediate complications. Estimated Blood Loss:     Estimated blood loss: none. Impression:               -Few circular rings in the distal esophagus                            (biopsied)                           -Small transient hiatal hernia.                           -Multiple gastric polyps. Biopsied.                           -Mild gastritis. Biopsied. Recommendation:           - Patient has a contact number available for                            emergencies. The signs and symptoms of potential                            delayed complications were discussed with the                            patient. Return to normal activities tomorrow.                            Written discharge instructions were provided to the                            patient.                           - Resume previous diet.                           - Continue Nexium 40 mg p.o. once a day.                           - Await pathology results.                           -  No aspirin, ibuprofen, naproxen, or other                            non-steroidal anti-inflammatory drugs for 5 days                            after biopsy.                           - FU in GI clinic in 12  weeks.                           - Proceed with colonoscopy. Jackquline Denmark, MD 03/21/2020 11:07:37 AM This report has been signed electronically.

## 2020-03-21 NOTE — Op Note (Signed)
[place] Patient Name: Sherri Ruiz Procedure Date: 03/21/2020 10:10 AM MRN: [id number] Endoscopist: Jackquline Denmark , MD Age: 59 Referring MD:  Date of Birth: 1961-04-19 Gender: Female Account #: [id number] Procedure:                Colonoscopy Indications:              Abdominal pain in the left upper quadrant Medicines:                Monitored Anesthesia Care Procedure:                Pre-Anesthesia Assessment:                           - Prior to the procedure, a History and Physical                            was performed, and patient medications and                            allergies were reviewed. The patient's tolerance of                            previous anesthesia was also reviewed. The risks                            and benefits of the procedure and the sedation                            options and risks were discussed with the patient.                            All questions were answered, and informed consent                            was obtained. Prior Anticoagulants: The patient has                            taken no previous anticoagulant or antiplatelet                            agents. ASA Grade Assessment: II - A patient with                            mild systemic disease. After reviewing the risks                            and benefits, the patient was deemed in                            satisfactory condition to undergo the procedure.                           After obtaining informed consent, the colonoscope  was passed under direct vision. Throughout the                            procedure, the patient's blood pressure, pulse, and                            oxygen saturations were monitored continuously. The                            Colonoscope was introduced through the anus and                            advanced to the 2 cm into the ileum. The                            colonoscopy was performed  without difficulty. The                            patient tolerated the procedure well. The quality                            of the bowel preparation was good except in some                            areas of the colon where there was adherent stool.                            The terminal ileum, ileocecal valve, appendiceal                            orifice, and rectum were photographed. Scope In: 10:35:27 AM Scope Out: 10:51:45 AM Scope Withdrawal Time: 0 hours 11 minutes 21 seconds  Total Procedure Duration: 0 hours 16 minutes 18 seconds  Findings:                 A 2 mm polyp was found in the proximal sigmoid                            colon. The polyp was sessile. The polyp was removed                            with a cold biopsy forceps. Resection and retrieval                            were complete.                           Multiple small and large-mouthed diverticula were                            found in the sigmoid colon with luminal narrowing                            consistent with  muscular hypertrophy, mainly up to                            40 cm from the anal verge. Several diverticula with                            stool impaction. No endoscopic diverticulitis. No                            SCAD. There are few diverticula visualized in                            transverse colon and ascending colon.                           Non-bleeding internal hemorrhoids were found during                            retroflexion. The hemorrhoids were small.                           The terminal ileum appeared normal.                           The exam was otherwise without abnormality on                            direct and retroflexion views. Complications:            No immediate complications. Estimated Blood Loss:     Estimated blood loss: none. Impression:               - One 2 mm polyp in the proximal sigmoid colon,                            removed with a cold  biopsy forceps. Resected and                            retrieved.                           - Moderate to severe sigmoid diverticulosis with                            sigmoid stenosis/stricture.                           - Otherwise normal colonoscopy to TI. Recommendation:           - Patient has a contact number available for                            emergencies. The signs and symptoms of potential                            delayed complications were discussed with the  patient. Return to normal activities tomorrow.                            Written discharge instructions were provided to the                            patient.                           - High fiber diet. Also start Benefiber 1                            tablespoon p.o. once a day with 8 ounces of water.                           - Continue present medications.                           - Await pathology results.                           - Repeat colonoscopy for surveillance based on                            pathology results.                           - Return to GI office in 12 weeks. If she continues                            to have abdominal problems, I would have low                            threshold in referring her for possible surgical                            evaluation for severe sigmoid diverticulosis with                            possible stricture/stenosis. Jackquline Denmark, MD 03/21/2020 11:00:11 AM This report has been signed electronically.

## 2020-03-21 NOTE — Progress Notes (Signed)
Called to room to assist during endoscopic procedure.  Patient ID and intended procedure confirmed with present staff. Received instructions for my participation in the procedure from the performing physician.  

## 2020-03-21 NOTE — Patient Instructions (Signed)
Please read handouts provided. Continue Nexium 40 mg once daily. Await pathology results. No aspirin, ibuprofen, naproxen, or other non-steriodal anti-inflammatory drugs for 5 days. Follow-up in GI clinic in 12 weeks. High fiber diet. Continue present medications.        YOU HAD AN ENDOSCOPIC PROCEDURE TODAY AT [facility]:   Refer to the procedure report that was given to you for any specific questions about what was found during the examination.  If the procedure report does not answer your questions, please call your gastroenterologist to clarify.  If you requested that your care partner not be given the details of your procedure findings, then the procedure report has been included in a sealed envelope for you to review at your convenience later.  YOU SHOULD EXPECT: Some feelings of bloating in the abdomen. Passage of more gas than usual.  Walking can help get rid of the air that was put into your GI tract during the procedure and reduce the bloating. If you had a lower endoscopy (such as a colonoscopy or flexible sigmoidoscopy) you may notice spotting of blood in your stool or on the toilet paper. If you underwent a bowel prep for your procedure, you may not have a normal bowel movement for a few days.  Please Note:  You might notice some irritation and congestion in your nose or some drainage.  This is from the oxygen used during your procedure.  There is no need for concern and it should clear up in a day or so.  SYMPTOMS TO REPORT IMMEDIATELY:   Following lower endoscopy (colonoscopy or flexible sigmoidoscopy):  Excessive amounts of blood in the stool  Significant tenderness or worsening of abdominal pains  Swelling of the abdomen that is new, acute  Fever of 100F or higher   Following upper endoscopy (EGD)  Vomiting of blood or coffee ground material  New chest pain or pain under the shoulder blades  Painful or persistently difficult swallowing  New  shortness of breath  Fever of 100F or higher  Black, tarry-looking stools  For urgent or emergent issues, a gastroenterologist can be reached at any hour by calling [phone]. Do not use MyChart messaging for urgent concerns.    DIET:  We do recommend a small meal at first, but then you may proceed to your regular diet.  Drink plenty of fluids but you should avoid alcoholic beverages for 24 hours.  ACTIVITY:  You should plan to take it easy for the rest of today and you should NOT DRIVE or use heavy machinery until tomorrow (because of the sedation medicines used during the test).    FOLLOW UP: Our staff will call the number listed on your records 48-72 hours following your procedure to check on you and address any questions or concerns that you may have regarding the information given to you following your procedure. If we do not reach you, we will leave a message.  We will attempt to reach you two times.  During this call, we will ask if you have developed any symptoms of COVID 19. If you develop any symptoms (ie: fever, flu-like symptoms, shortness of breath, cough etc.) before then, please call [phone].  If you test positive for Covid 19 in the 2 weeks post procedure, please call and report this information to [place].    If any biopsies were taken you will be contacted by phone or by letter within the next 1-3 weeks.  Please call us at [phone]  if you have not heard about the biopsies in 3 weeks.    SIGNATURES/CONFIDENTIALITY: You and/or your care partner have signed paperwork which will be entered into your electronic medical record.  These signatures attest to the fact that that the information above on your After Visit Summary has been reviewed and is understood.  Full responsibility of the confidentiality of this discharge information lies with you and/or your care-partner.

## 2020-03-21 NOTE — Progress Notes (Signed)
Report to PACU, RN, vss, BBS= Clear.  

## 2020-03-23 ENCOUNTER — Telehealth: Payer: Self-pay

## 2020-03-23 NOTE — Telephone Encounter (Signed)
Thanks for letting me know. Should get better  RG

## 2020-03-23 NOTE — Telephone Encounter (Signed)
  Follow up Call-  Call back number 03/21/2020  Post procedure Call Back phone  # [phone]  Permission to leave phone message Yes  Some recent data might be hidden     Patient questions:  Do you have a fever, pain , or abdominal swelling? No. Pain Score  4 *  Have you tolerated food without any problems? Yes.    Have you been able to return to your normal activities? Yes.    Do you have any questions about your discharge instructions: Diet   No. Medications  No. Follow up visit  No.  Do you have questions or concerns about your Care? Yes.    Actions: * If pain score is 4 or above: Physician/ provider Notified : Jackquline Denmark, MD   1. Have you developed a fever since your procedure? no  2.   Have you had an respiratory symptoms (SOB or cough) since your procedure? no  3.   Have you tested positive for COVID 19 since your procedure no  4.   Have you had any family members/close contacts diagnosed with the COVID 19 since your procedure?  no   If yes to any of these questions please route to Joylene John, RN and Erenest Rasher, RN.

## 2020-03-26 ENCOUNTER — Encounter: Payer: Self-pay | Admitting: Gastroenterology

## 2020-03-27 ENCOUNTER — Telehealth: Payer: Self-pay | Admitting: Gastroenterology

## 2020-03-27 DIAGNOSIS — B9689 Other specified bacterial agents as the cause of diseases classified elsewhere: Secondary | ICD-10-CM | POA: Diagnosis not present

## 2020-03-27 DIAGNOSIS — J019 Acute sinusitis, unspecified: Secondary | ICD-10-CM | POA: Diagnosis not present

## 2020-03-27 HISTORY — PX: CATARACT EXTRACTION: SUR2

## 2020-03-27 NOTE — Telephone Encounter (Signed)
Patient called has concerns and questions on what to treat first gastritis or diverticulitis. please advise

## 2020-04-01 ENCOUNTER — Other Ambulatory Visit: Payer: Self-pay | Admitting: Gastroenterology

## 2020-04-03 NOTE — Telephone Encounter (Signed)
Left message on voicemail to return call regarding starting new medication.  Pt given results of colonoscopy.

## 2020-04-05 NOTE — Telephone Encounter (Signed)
Left message on patient voicemail asking if she has called insurance to see which inhalers are covered.  Requested for her to call me back so we may call in a prescription for her

## 2020-04-10 ENCOUNTER — Telehealth: Payer: Self-pay

## 2020-04-10 NOTE — Telephone Encounter (Signed)
-----   Message from Jackquline Denmark, MD sent at 03/26/2020  2:45 PM EDT ----- Start Flovent inhalers (184mcg/inh) twice daily for 1 month, then daily for 2 months or Advair 250/50 inhalations twice daily for 1 month and then once a day for 1 month or any steroid inhaler covered by insurance.  Must swallow.  Do not eat or drink or rinse for 20-30 minutes thereafter.  Then can rinse with warm water. Also see the letter Continue PPIs.

## 2020-04-10 NOTE — Telephone Encounter (Signed)
I have left several messages for patient to return call regarding inhalers.  I asked her to contact her insurance company to find out which ones are covered and we will send a prescription to her pharmacy.

## 2020-04-11 ENCOUNTER — Other Ambulatory Visit: Payer: Self-pay

## 2020-04-11 MED ORDER — FLUTICASONE PROPIONATE HFA 110 MCG/ACT IN AERO: INHALATION_SPRAY | RESPIRATORY_TRACT | 12 refills | Status: DC

## 2020-04-11 NOTE — Telephone Encounter (Signed)
Was able to reach patient this morning and we discussed her results and the letter she stated she did not receive.  I mailed letter again.  Message sent to Dr Lyndel Safe regarding muscular hypertrophy concerns.

## 2020-04-13 ENCOUNTER — Telehealth: Payer: Self-pay

## 2020-04-13 NOTE — Telephone Encounter (Signed)
Patient is going out of town from 8-11 to 8-25. Patient would like to have a Wednesday appointment. Told patient I would call in July to see about setting up appointment

## 2020-04-13 NOTE — Telephone Encounter (Signed)
LVM for patient to call to schedule her 12 week follow up from 03-21-2020 from [place]

## 2020-04-16 DIAGNOSIS — F411 Generalized anxiety disorder: Secondary | ICD-10-CM | POA: Diagnosis not present

## 2020-04-18 DIAGNOSIS — H2512 Age-related nuclear cataract, left eye: Secondary | ICD-10-CM | POA: Diagnosis not present

## 2020-04-18 DIAGNOSIS — H25812 Combined forms of age-related cataract, left eye: Secondary | ICD-10-CM | POA: Diagnosis not present

## 2020-04-23 DIAGNOSIS — F411 Generalized anxiety disorder: Secondary | ICD-10-CM | POA: Diagnosis not present

## 2020-04-25 ENCOUNTER — Encounter: Payer: Self-pay | Admitting: Allergy and Immunology

## 2020-04-25 ENCOUNTER — Ambulatory Visit: Payer: Federal, State, Local not specified - PPO | Admitting: Allergy and Immunology

## 2020-04-25 ENCOUNTER — Other Ambulatory Visit: Payer: Self-pay

## 2020-04-25 VITALS — BP 122/72 | HR 72 | Resp 16

## 2020-04-25 DIAGNOSIS — K200 Eosinophilic esophagitis: Secondary | ICD-10-CM | POA: Diagnosis not present

## 2020-04-25 DIAGNOSIS — K219 Gastro-esophageal reflux disease without esophagitis: Secondary | ICD-10-CM

## 2020-04-25 DIAGNOSIS — J3089 Other allergic rhinitis: Secondary | ICD-10-CM | POA: Diagnosis not present

## 2020-04-25 NOTE — Telephone Encounter (Signed)
Called and left voicemail. Patient can schedule on 07-04-2020 its a Wednesday. She would like a [place]

## 2020-04-25 NOTE — Progress Notes (Signed)
Patient wants to hold off on Depomedrol injection at this time due to getting recent Kenalog injection back around April.

## 2020-04-25 NOTE — Patient Instructions (Addendum)
  1. Continue to avoid all dairy consumption.  Consider FFED (Dairy / Egg / Wheat / Legumes) diet.  2. Continue to Treat inflammation:   A. OTC Rhinocort / Nasacort 1 spray each nostril 1 time per day  B. montelukast 10 mg tablet 1 time per day  C. Flovent 110 - 2 puff and swallows 2 times per day. Do not rinse x 20 min.  3. Continue to Treat reflux:   A. Nexium 40 mg tablet in PM  4.  Can add OTC antihistamine - cetirizine - if needed  5. Return to clinic in 4 weeks  6. Plan for fall flu vaccine

## 2020-04-25 NOTE — Progress Notes (Signed)
[facility] [place] [facility] [place]   Follow-up Note  Referring Provider: Nicoletta Dress, MD Primary Provider: Nicoletta Dress, MD Date of Office Visit: 04/25/2020  Subjective:   Sherri Ruiz (DOB: February 08, 1961) is a 59 y.o. female who returns to the [facility] on 04/25/2020 in re-evaluation of the following:  HPI: Sheena returns to this clinic in evaluation of allergic rhinitis and LPR and a history of eosinophilic esophagitis and other GI issues addressed during her last evaluation of 25 January 2020.  Once again she has had excellent control of her airway issue and really has no problems with her nose and has continued to use some occasional nasal steroid especially during the spring and montelukast on a consistent basis.  She has continued to have very significant GI issues including tenesmus and diarrhea and vomiting and abdominal pain and bloating.  Her upper endoscopy completed in May 2021 identified eosinophilic esophagitis.  She is already dairy free and has been for a long period in time and recently became egg free.  It was suggested that she use a swallowed steroid but she did not utilize this agent.  Allergies as of 04/25/2020      Reactions   Clindamycin/lincomycin Swelling   Meperidine Nausea And Vomiting   unknown   Penicillin G Other (See Comments)   Unknown/childhood allergy Has patient had a PCN reaction causing immediate rash, facial/tongue/throat swelling, SOB or lightheadedness with hypotension: Yes Has patient had a PCN reaction causing severe rash involving mucus membranes or skin necrosis: Unknown Has patient had a PCN reaction that required hospitalization: No Has patient had a PCN reaction occurring within the last 10 years: Unknown If all of the above answers are "NO", then may proceed with Cephalosporin use.      Medication List      albuterol 108 (90 Base) MCG/ACT inhaler Commonly known as: VENTOLIN  HFA Inhale into the lungs every 6 (six) hours as needed for wheezing or shortness of breath. No recent use   ALPRAZolam 0.25 MG tablet Commonly known as: XANAX Take 0.25 mg by mouth as needed for anxiety.   AMBULATORY NON FORMULARY MEDICATION Medication Name: GI Cocktail (equal amounts of Maalox, Viscous Lidocaine, Dicyclomine) 10 cc every 8 hours as needed.   atorvastatin 20 MG tablet Commonly known as: LIPITOR Take 20 mg by mouth at bedtime.   bismuth subsalicylate 932 MG chewable tablet Commonly known as: PEPTO BISMOL Chew 262 mg by mouth as needed.   Butalbital-APAP-Caffeine 50-325-40 MG capsule   CALCIUM CARBONATE PO Take 1 tablet by mouth daily. Chewables   colestipol 1 g tablet Commonly known as: COLESTID Take 1 tablet (1 g total) by mouth 2 (two) times daily.   DIGESTIVE ADVANTAGE PO Take by mouth daily.   GAS-X PO Take 1 tablet by mouth 3 (three) times daily as needed.   HAIR SKIN AND NAILS FORMULA PO Take 1 tablet by mouth daily.   hyoscyamine 0.125 MG SL tablet Commonly known as: LEVSIN SL PLACE 1 TABLET (0.125 MG TOTAL) UNDER THE TONGUE EVERY 6 (SIX) HOURS AS NEEDED.   levothyroxine 50 MCG tablet Commonly known as: SYNTHROID Take 50 mcg by mouth daily before breakfast.   lisinopril 5 MG tablet Commonly known as: ZESTRIL Take 5 mg by mouth daily.   Minivelle 0.1 MG/24HR patch Generic drug: estradiol Place 1 patch onto the skin 2 (two) times a week.   montelukast 10 MG tablet Commonly known as: SINGULAIR TAKE  1 TABLET ONCE DAILY ASDIRECTED   MULTIVITAMIN PO Take by mouth daily.   Nasacort Allergy 24HR 55 MCG/ACT Aero nasal inhaler Generic drug: triamcinolone Place 1 spray into the nose as needed.   NexIUM 40 MG capsule Generic drug: esomeprazole Take 1 capsule (40 mg total) by mouth daily.   promethazine 25 MG tablet Commonly known as: PHENERGAN   Vitamin C 500 MG Caps Take 1 tablet by mouth daily.   ZYRTEC PO Take 1 tablet by mouth  daily.       Past Medical History:  Diagnosis Date   Allergic rhinitis    Allergy    Anxiety    OCC    Asthma    none since on Singulair started    Cataract    removed right eye, left forming    Congenital clotting factor deficiency (HCC)    Eosinophilic esophagitis    GERD (gastroesophageal reflux disease)    High blood pressure    High cholesterol    History of hepatitis A    Hypothyroidism    IBS (irritable bowel syndrome)    IDA (iron deficiency anemia)    LPRD (laryngopharyngeal reflux disease)    Penicillin allergy    Peripheral neuropathy    PONV (postoperative nausea and vomiting)    Due to Demerol    Vitamin B12 deficiency anemia     Past Surgical History:  Procedure Laterality Date   CATARACT EXTRACTION  03/2020   CHOLECYSTECTOMY     COLONOSCOPY     ESOPHAGOGASTRODUODENOSCOPY  06/16/2014   Wide open cervial esophageal web. Mild gastrtitis. Gastric polyps.    ESOPHAGOGASTRODUODENOSCOPY  12/25/2017   OTHER SURGICAL HISTORY     Hysterectomy   TOTAL ABDOMINAL HYSTERECTOMY     UPPER GASTROINTESTINAL ENDOSCOPY      Review of systems negative except as noted in HPI / PMHx or noted below:  Review of Systems  Constitutional: Negative.   HENT: Negative.   Eyes: Negative.   Respiratory: Negative.   Cardiovascular: Negative.   Gastrointestinal: Negative.   Genitourinary: Negative.   Musculoskeletal: Negative.   Skin: Negative.   Neurological: Negative.  Weakness:   Endo/Heme/Allergies: Negative.   Psychiatric/Behavioral: Negative.      Objective:   Vitals:   04/25/20 0946  BP: 122/72  Pulse: 72  Resp: 16  SpO2: 96%          Physical Exam Constitutional:      Appearance: She is not diaphoretic.  HENT:     Head: Normocephalic.     Right Ear: Tympanic membrane, ear canal and external ear normal.     Left Ear: Tympanic membrane, ear canal and external ear normal.     Nose: Nose normal. No mucosal edema or  rhinorrhea.     Mouth/Throat:     Pharynx: Uvula midline. No oropharyngeal exudate.  Eyes:     Conjunctiva/sclera: Conjunctivae normal.  Neck:     Thyroid: No thyromegaly.     Trachea: Trachea normal. No tracheal tenderness or tracheal deviation.  Cardiovascular:     Rate and Rhythm: Normal rate and regular rhythm.     Heart sounds: Normal heart sounds, S1 normal and S2 normal. No murmur heard.   Pulmonary:     Effort: No respiratory distress.     Breath sounds: Normal breath sounds. No stridor. No wheezing or rales.  Lymphadenopathy:     Head:     Right side of head: No tonsillar adenopathy.     Left side of head:  No tonsillar adenopathy.     Cervical: No cervical adenopathy.  Skin:    Findings: No erythema or rash.     Nails: There is no clubbing.  Neurological:     Mental Status: She is alert.     Diagnostics: Allergy skin tests were performed.  She demonstrated hypersensitivity to shrimp but no other foods.  Should be noted that she can eat shellfish with no problem.  Results of blood tests obtained 13 February 2020 identified WBC 5.5, absolute eosinophil 100, absolute lymphocyte 1700, hemoglobin 13.5, tryptase 5.1 ug/L, negative celiac screen with transglutaminase IgA antibody less than 2U/ml  Results of a esophageal biopsy obtained 21 Mar 2020 identified eosinophilic esophagitis with greater than 30 eosinophils per high-power field  Assessment and Plan:   1. Eosinophilic esophagitis   2. Other allergic rhinitis   3. LPRD (laryngopharyngeal reflux disease)     1. Continue to avoid all dairy consumption.  Consider FFED (Dairy / Egg / Wheat / Legumes) diet.  2. Continue to Treat inflammation:   A. OTC Rhinocort / Nasacort 1 spray each nostril 1 time per day  B. montelukast 10 mg tablet 1 time per day  C. Flovent 110 - 2 puff and swallows 2 times per day. Do not rinse x 20 min.  3. Continue to Treat reflux:   A. Nexium 40 mg tablet in PM  4.  Can add OTC  antihistamine - cetirizine - if needed  5. Return to clinic in 4 weeks  6. Plan for fall flu vaccine   I have encouraged Mashelle to undergo a for food elimination diet for the next 4 weeks and also start a swallowed steroid to treat her eosinophilic esophagitis.  She can continue on her other medications for her atopic disease as noted above and I will see her back in this clinic in 4 weeks to make a decision about further evaluation and treatment based upon her response to this approach.   Allena Katz, MD Allergy / Immunology [place]

## 2020-04-26 ENCOUNTER — Encounter: Payer: Self-pay | Admitting: Allergy and Immunology

## 2020-04-26 NOTE — Telephone Encounter (Signed)
Left voicemail for patient to schedule her f/u for 9-8 since she wanted a Wednesday

## 2020-05-03 ENCOUNTER — Other Ambulatory Visit: Payer: Self-pay | Admitting: Gastroenterology

## 2020-05-03 ENCOUNTER — Telehealth: Payer: Self-pay | Admitting: Gastroenterology

## 2020-05-03 NOTE — Telephone Encounter (Signed)
LMOM for patient to call back.

## 2020-05-03 NOTE — Telephone Encounter (Signed)
Spoke to patient who states that she has had diarrhea and extreme gas  every morning for the past week. EGD/Colonoscopy 5/21.She was  recommended to start a high fiber diet along with Benefiber 1 tablespoon daily. She did not start Benefibe,but instead started fiber gummies two daily. Patient increased her fruit and vege intake daily. She has taken Pepto Bismol with no relief. Patient would like to fix her symptoms before any surgical consult. She is asking for further suggestions on how much fiber to take daily and what to do for symptom control.

## 2020-05-07 DIAGNOSIS — F411 Generalized anxiety disorder: Secondary | ICD-10-CM | POA: Diagnosis not present

## 2020-05-07 NOTE — Telephone Encounter (Signed)
She can stop taking fiber supplements altogether for now I think the diarrhea would improve just by doing that Let us know how it is in 1 week  RG

## 2020-05-08 ENCOUNTER — Other Ambulatory Visit: Payer: Self-pay | Admitting: Gastroenterology

## 2020-05-08 NOTE — Telephone Encounter (Signed)
Patient has been notified to stop fiber upplements and call us in 1 week to report her symptoms.

## 2020-05-16 DIAGNOSIS — F411 Generalized anxiety disorder: Secondary | ICD-10-CM | POA: Diagnosis not present

## 2020-05-22 DIAGNOSIS — F411 Generalized anxiety disorder: Secondary | ICD-10-CM | POA: Diagnosis not present

## 2020-05-29 DIAGNOSIS — R140 Abdominal distension (gaseous): Secondary | ICD-10-CM | POA: Diagnosis not present

## 2020-05-29 DIAGNOSIS — Z7689 Persons encountering health services in other specified circumstances: Secondary | ICD-10-CM | POA: Diagnosis not present

## 2020-05-30 ENCOUNTER — Telehealth: Payer: Self-pay | Admitting: Gastroenterology

## 2020-05-30 NOTE — Telephone Encounter (Signed)
Patient called states she was previously given a GI cocktail and she is requesting a refill but it was denied and she is wanting to know why. Please advise

## 2020-05-31 DIAGNOSIS — J0100 Acute maxillary sinusitis, unspecified: Secondary | ICD-10-CM | POA: Diagnosis not present

## 2020-05-31 DIAGNOSIS — F411 Generalized anxiety disorder: Secondary | ICD-10-CM | POA: Diagnosis not present

## 2020-05-31 NOTE — Telephone Encounter (Signed)
Patient called states the pharmacy does not have the script

## 2020-06-01 ENCOUNTER — Telehealth: Payer: Self-pay

## 2020-06-01 ENCOUNTER — Other Ambulatory Visit: Payer: Self-pay

## 2020-06-01 MED ORDER — AMBULATORY NON FORMULARY MEDICATION: 2 refills | Status: DC

## 2020-06-01 NOTE — Telephone Encounter (Signed)
Called patient back to let her know that I am waiting to hear back from Dr Lyndel Safe on refill. I told her to check with the pharmacy this weekend.  That he may send it then.

## 2020-06-01 NOTE — Telephone Encounter (Signed)
Okay to fill GI cocktail 10 cc p.o. every 6 hours as needed, #178ml, 2 refills RG

## 2020-06-01 NOTE — Telephone Encounter (Signed)
GI cocktail prescription faxed to [facility]. Pt aware.

## 2020-06-07 DIAGNOSIS — F411 Generalized anxiety disorder: Secondary | ICD-10-CM | POA: Diagnosis not present

## 2020-06-08 DIAGNOSIS — K222 Esophageal obstruction: Secondary | ICD-10-CM | POA: Diagnosis not present

## 2020-06-08 DIAGNOSIS — K219 Gastro-esophageal reflux disease without esophagitis: Secondary | ICD-10-CM | POA: Diagnosis not present

## 2020-06-25 DIAGNOSIS — F411 Generalized anxiety disorder: Secondary | ICD-10-CM | POA: Diagnosis not present

## 2020-07-04 ENCOUNTER — Ambulatory Visit: Payer: Federal, State, Local not specified - PPO | Admitting: Gastroenterology

## 2020-07-04 ENCOUNTER — Encounter: Payer: Self-pay | Admitting: Gastroenterology

## 2020-07-04 VITALS — BP 124/72 | HR 66 | Ht 63.0 in | Wt 124.4 lb

## 2020-07-04 DIAGNOSIS — F411 Generalized anxiety disorder: Secondary | ICD-10-CM | POA: Diagnosis not present

## 2020-07-04 DIAGNOSIS — R1012 Left upper quadrant pain: Secondary | ICD-10-CM

## 2020-07-04 DIAGNOSIS — K200 Eosinophilic esophagitis: Secondary | ICD-10-CM

## 2020-07-04 MED ORDER — AMBULATORY NON FORMULARY MEDICATION: 2 refills | Status: DC

## 2020-07-04 NOTE — Progress Notes (Signed)
Chief Complaint:   Referring Provider:  Nicoletta Dress, MD      ASSESSMENT AND PLAN;   #1. LUQ abdominal pain (resolved).  Neg CT AP 02/2019.  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 Colon 03/21/2020 small polyp, mod-sever div #2. PPI responsive EoE with GERD/NCCP. Dx 05/2014, treated with PPIs and Flovent, did very well on food restriction.  #3. H/O diverticulitis, treated with antibiotics 11/2018. Has moderate to severe sigmoid diverticulosis with possible muscular hypertrophy/stenosis. #4. IBS-C  Plan - Continue nexium 40 mg p.o. QD #30, 11 refills - Continue fiber gummies qd.  Can use MiraLAX on as needed basis. - Avoid diary, eggs, potatoes, chocolate and caffeine as suggested by Dr. Neldon Mc. - GI cocktail (equal amounts of Maalox, viscous lidocaine, dicyclomine or Donnatal).  10 cc p.o. every 8 hours as needed. #120 ml. 2 refills - Avoid nonsteroidals. - Patient is to call us if Sherri Ruiz still has problems. - Levsin 0.125mg  S/L q6 hrs as needed. #120. 2 refills.   HPI:    Sherri Ruiz is a 59 y.o. female  FU from EGD/colonoscopy 03/21/2020 - report as below.  Feels better  Took a brief course of Flovent, then had oral thrush requiring nystatin.  Sherri Ruiz has stopped taking Flovent.  Sherri Ruiz is on restricted diet as suggested by Dr. Neldon Mc.  Sherri Ruiz tried reducing Nexium to 20 mg p.o. once a day but then had breakthrough heartburn.  Sherri Ruiz is back on 40 mg p.o. once a day.  Doing well.  No odynophagia or dysphagia.  Rare breakthrough symptoms controlled by GI cocktail/Mylanta.  Has been taking fiber Gummies which did cause diarrhea.  Sherri Ruiz is backed off and is taking once a day now.  Has normal bowel movements-softer without straining.  Abdominal pain is almost completely resolved.  Sherri Ruiz does use Levsin on a as needed basis.  Stress triggers attacks as well.  Has been seeing a counselor  No sodas, chocolates, chewing gums, artificial sweeteners and candy. No  NSAIDs.  We had gone over previous CT Abdo/pelvis.  Complex cyst in the liver.  Sherri Ruiz does want to hold off on MRI at the present time.   Does admit that Sherri Ruiz has been under considerable stress due to family problems (daughters with the grandkids had moved in.  At one point Sherri Ruiz had 11 people living in her home, Sherri Ruiz also recently has been promoted in her job).  1 daughter is a Marine scientist at surgical floor Sherri Ruiz.  Past GI Proc: EGD 03/21/2020  -Few circular rings in the distal esophagus (bx-eosinophilic esophagitis Eos> 30 per high-power field) -Small transient hiatal hernia. -Multiple gastric polyps. Bx- fundic gland polyps. -Mild gastritis. Biopsied.  EGD 05/2014-wide-open cervical esophageal web, mild gastritis, gastric polyps.  Eso Bx- showed increased eosinophils over 20 per HPF c/w eosinophilic esophagitis.  Gastric Bx- reactive gastropathy, intestinal metaplasia.  Subsequent EGD 11/27/2246-[id number] of eosinophilic esophagitis on biopsies, gastric mapping negative for intestinal metaplasia, small incidental gastric polyps measuring 4 to 6 mm.  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  Otherwise normal to TI..  10/07/2013-good prep, moderate sigmoid diverticulosis, small internal hemorrhoids.  Otherwise normal to TI.  Next due 09/2023 unless with any new problems. 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 - CT AP 02/2019: IMPRESSION: 1. No acute findings in the abdomen or pelvis. Relatively advanced left colonic diverticulosis without diverticulitis. 2. 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 3. The 16 mm low-density lesion in the anterior right liver may be a complex cysts, but cannot be definitively characterized. MRI of the abdomen with and without contrast recommended to further evaluate. Sherri Ruiz wanted to hold off on MRI Past Medical History:  Diagnosis Date  . Allergic rhinitis   . Allergy   . Anxiety    OCC   . Asthma    none since on Singulair started   . Cataract    removed right eye, left forming   . Congenital clotting factor deficiency ([place])   . Eosinophilic esophagitis   . GERD (gastroesophageal reflux disease)   . High blood pressure   . High cholesterol   . History of hepatitis A   . Hypothyroidism   . IBS (irritable bowel syndrome)   . IDA (iron deficiency anemia)   . LPRD (laryngopharyngeal reflux disease)   . Penicillin allergy   . Peripheral neuropathy   . PONV (postoperative nausea and vomiting)    Due to Demerol   . Vitamin B12 deficiency anemia     Past Surgical History:  Procedure Laterality Date  . CATARACT EXTRACTION  03/2020  . CHOLECYSTECTOMY    . COLONOSCOPY    . ESOPHAGOGASTRODUODENOSCOPY  06/16/2014   Wide open cervial esophageal web. Mild gastrtitis. Gastric polyps.   . ESOPHAGOGASTRODUODENOSCOPY  12/25/2017  . OTHER SURGICAL HISTORY     Hysterectomy  . TOTAL ABDOMINAL HYSTERECTOMY    . UPPER GASTROINTESTINAL ENDOSCOPY      Family History  Problem Relation Age of Onset  . Clotting disorder Mother   . High Cholesterol Mother   . Asthma Mother   . Stroke Mother   . High blood pressure Father   . Heart attack Father 12       [place].  Atrial fib.    . Colon polyps Father   . Cancer Sister   . Asthma Daughter   . Heart disease Brother        Pacemaker  . Stomach cancer Maternal Aunt   . Esophageal cancer Other   . Colon cancer Neg Hx   . Rectal cancer Neg Hx     Social History   Tobacco Use  . Smoking status: Former Smoker    Types: Cigarettes    Quit date: 1989    Years since quitting: 32.7   . Smokeless tobacco: Never Used  Vaping Use  . Vaping Use: Never used  Substance Use Topics  . Alcohol use: Not Currently  . Drug use: Never    Current Outpatient Medications  Medication Sig Dispense Refill  . ALPRAZolam (XANAX) 0.25 MG tablet Take 0.25 mg by mouth as needed for anxiety.   0  . atorvastatin (LIPITOR) 20 MG tablet Take 20 mg by mouth at bedtime.   1  . Cetirizine HCl (ZYRTEC PO) Take 1 tablet by mouth daily.     . fluticasone (FLOVENT HFA) 110 MCG/ACT inhaler Use twice daily for 1 month, then daily for 2 months.  MUST swallow. Do not eat or drink or rinse for 20-30 minutes thereafter.  Then can rinse with warm water. 1 Inhaler 12  . levothyroxine (SYNTHROID, LEVOTHROID) 50 MCG tablet Take 50 mcg by mouth daily before breakfast.     .  lisinopril (PRINIVIL,ZESTRIL) 5 MG tablet Take 5 mg by mouth daily.     Marland Kitchen MINIVELLE 0.1 MG/24HR patch Place 1 patch onto the skin 2 (two) times a week.     . montelukast (SINGULAIR) 10 MG tablet TAKE 1 TABLET ONCE DAILY ASDIRECTED 90 tablet 0  . Multiple Vitamins-Minerals (HAIR SKIN AND NAILS FORMULA PO) Take 1 tablet by mouth daily.    . Multiple Vitamins-Minerals (MULTIVITAMIN PO) Take by mouth daily.    Marland Kitchen NEXIUM 40 MG capsule Take 1 capsule (40 mg total) by mouth daily. 30 capsule 5  . Probiotic Product (DIGESTIVE ADVANTAGE PO) Take by mouth daily.    . promethazine (PHENERGAN) 25 MG tablet     . Simethicone (GAS-X PO) Take 1 tablet by mouth 3 (three) times daily as needed.    Marland Kitchen albuterol (VENTOLIN HFA) 108 (90 Base) MCG/ACT inhaler Inhale into the lungs every 6 (six) hours as needed for wheezing or shortness of breath. No recent use (Patient not taking: Reported on 07/04/2020)    . AMBULATORY NON FORMULARY MEDICATION Medication Name: GI Cocktail (equal amounts of Maalox, Viscous Lidocaine, Dicyclomine) 10 cc every 8 hours as needed. (Patient not taking: Reported on 07/04/2020) 120 mL 2  . AMBULATORY NON FORMULARY MEDICATION GI cocktail - 90  ml 2 % Lidocaine 90 ml Dicyclomine 10 mg/72ml 270 ml Maalox (Patient not taking: Reported on 07/04/2020) 120 mL 2  . hyoscyamine (LEVSIN SL) 0.125 MG SL tablet PLACE 1 TABLET (0.125 MG TOTAL) UNDER THE TONGUE EVERY 6 (SIX) HOURS AS NEEDED. (Patient not taking: Reported on 07/04/2020) 360 tablet 1  . triamcinolone (NASACORT ALLERGY 24HR) 55 MCG/ACT AERO nasal inhaler Place 1 spray into the nose as needed.  (Patient not taking: Reported on 07/04/2020)     No current facility-administered medications for this visit.    Allergies  Allergen Reactions  . Clindamycin/Lincomycin Swelling  . Meperidine Nausea And Vomiting    unknown  . Penicillin G Other (See Comments)    Unknown/childhood allergy Has patient had a PCN reaction causing immediate rash, facial/tongue/throat swelling, SOB or lightheadedness with hypotension: Yes Has patient had a PCN reaction causing severe rash involving mucus membranes or skin necrosis: Unknown Has patient had a PCN reaction that required hospitalization: No Has patient had a PCN reaction occurring within the last 10 years: Unknown If all of the above answers are "NO", then may proceed with Cephalosporin use.      Review of Systems:  Negative except for HPI     Physical Exam:    BP 124/72   Pulse 66   Ht 5\' 3"  (1.6 m)   Wt 124 lb 6 oz (56.4 kg)   LMP 07/20/2018   BMI 22.03 kg/m  Filed Weights   07/04/20 0832  Weight: 124 lb 6 oz (56.4 kg)   Constitutional:  Well-developed, in no acute distress. Psychiatric: Normal mood and affect. Behavior is normal. HEENT: Pupils normal.  Conjunctivae are normal. No scleral icterus. Cardiovascular: Normal rate, regular rhythm. No edema Pulmonary/chest: Effort normal and breath sounds normal. No wheezing, rales or rhonchi. Abdominal: Soft, nondistended.  Mild epigastric/left upper quadrant tenderness. Bowel sounds active throughout. There are no masses palpable. No hepatomegaly. Neurological: Alert and oriented to  person place and time. Skin: Skin is warm and dry. No rashes noted.  Data Reviewed: I have personally reviewed following labs and imaging studies  CBC: CBC Latest Ref Rng & Units 02/13/2020 03/16/2019 07/20/2018  WBC 3.4 - 10.8 x10E3/uL 5.5 4.5 4.8  Hemoglobin 11.1 - 15.9 g/dL 13.5 13.8 13.8  Hematocrit 34.0 - 46.6 % 39.5 41.2 39.8  Platelets 150 - 450 x10E3/uL - 226 219    CMP: CMP Latest Ref Rng & Units 03/16/2019 07/20/2018 07/20/2018  Glucose 65 - 99 mg/dL 94 - 99  BUN 6 - 24 mg/dL 4(L) - 7  Creatinine 0.57 - 1.00 mg/dL 0.77 0.81 0.79  Sodium 134 - 144 mmol/L 135 - 141  Potassium 3.5 - 5.2 mmol/L 4.2 - 3.8  Chloride 96 - 106 mmol/L 98 - 103  CO2 20 - 29 mmol/L 26 - 32  Calcium 8.7 - 10.2 mg/dL 9.2 - 9.3  Total Protein 6.0 - 8.5 g/dL 6.0 - -  Total Bilirubin 0.0 - 1.2 mg/dL 0.4 - -  Alkaline Phos 39 - 117 IU/L 48 - -  AST 0 - 40 IU/L 20 - -  ALT 0 - 32 IU/L 19 - -      Carmell Austria, MD 07/04/2020, 8:51 AM  Cc: Nicoletta Dress, MD

## 2020-07-04 NOTE — Patient Instructions (Signed)
If you are age 57 or older, your body mass index should be between 23-30. Your Body mass index is 22.03 kg/m. If this is out of the aforementioned range listed, please consider follow up with your Primary Care Provider.  If you are age 53 or younger, your body mass index should be between 19-25. Your Body mass index is 22.03 kg/m. If this is out of the aformentioned range listed, please consider follow up with your Primary Care Provider.   We have sent the following medications to your pharmacy for you to pick up at your convenience: GI Cocktail   Thank you,  Dr. Jackquline Denmark

## 2020-07-10 DIAGNOSIS — F411 Generalized anxiety disorder: Secondary | ICD-10-CM | POA: Diagnosis not present

## 2020-07-23 DIAGNOSIS — J069 Acute upper respiratory infection, unspecified: Secondary | ICD-10-CM | POA: Diagnosis not present

## 2020-07-23 DIAGNOSIS — R252 Cramp and spasm: Secondary | ICD-10-CM | POA: Diagnosis not present

## 2020-07-31 DIAGNOSIS — F411 Generalized anxiety disorder: Secondary | ICD-10-CM | POA: Diagnosis not present

## 2020-08-07 DIAGNOSIS — Z1231 Encounter for screening mammogram for malignant neoplasm of breast: Secondary | ICD-10-CM | POA: Diagnosis not present

## 2020-09-10 ENCOUNTER — Telehealth: Payer: Self-pay | Admitting: Gastroenterology

## 2020-09-10 NOTE — Telephone Encounter (Signed)
Please advise 

## 2020-09-11 NOTE — Telephone Encounter (Signed)
Lets try Levsin 0.125 S/L 1 to 2 tablets every 6 hours as needed, 120, 2 refills She can also try Maalox 1 teaspoon every 6-8 hours as needed   RG

## 2020-09-12 MED ORDER — HYOSCYAMINE SULFATE 0.125 MG SL SUBL: 0.1250 mg | SUBLINGUAL_TABLET | Freq: Four times a day (QID) | SUBLINGUAL | 2 refills | Status: DC | PRN

## 2020-09-12 NOTE — Telephone Encounter (Signed)
I have sent prescription to patients pharmacy. I have called patient and left a message.

## 2020-09-13 DIAGNOSIS — F411 Generalized anxiety disorder: Secondary | ICD-10-CM | POA: Diagnosis not present

## 2020-09-17 ENCOUNTER — Telehealth: Payer: Self-pay | Admitting: Gastroenterology

## 2020-09-17 DIAGNOSIS — F411 Generalized anxiety disorder: Secondary | ICD-10-CM | POA: Diagnosis not present

## 2020-09-17 NOTE — Telephone Encounter (Signed)
I have called and spoke to the patient, she says that she has been taking the Nexium 40 mg once daily and Maalox for breakthrough acid reflux. Would like to know if you suggest anything else?

## 2020-09-18 NOTE — Telephone Encounter (Signed)
I have called and spoke to patient, patient voiced understanding.

## 2020-09-18 NOTE — Telephone Encounter (Signed)
Increase Nexium to 40 mg p.o. twice daily x 2 weeks, then can cut it down to once a day Let us know how she does RG

## 2020-10-01 ENCOUNTER — Other Ambulatory Visit: Payer: Self-pay

## 2020-10-01 MED ORDER — MONTELUKAST SODIUM 10 MG PO TABS: ORAL_TABLET | ORAL | 1 refills | Status: DC

## 2020-11-19 DIAGNOSIS — Z0000 Encounter for general adult medical examination without abnormal findings: Secondary | ICD-10-CM | POA: Diagnosis not present

## 2020-11-19 DIAGNOSIS — Z23 Encounter for immunization: Secondary | ICD-10-CM | POA: Diagnosis not present

## 2020-11-22 DIAGNOSIS — Z1152 Encounter for screening for COVID-19: Secondary | ICD-10-CM | POA: Diagnosis not present

## 2020-11-23 DIAGNOSIS — B9689 Other specified bacterial agents as the cause of diseases classified elsewhere: Secondary | ICD-10-CM | POA: Diagnosis not present

## 2020-11-23 DIAGNOSIS — J019 Acute sinusitis, unspecified: Secondary | ICD-10-CM | POA: Diagnosis not present

## 2020-11-26 ENCOUNTER — Ambulatory Visit: Payer: Federal, State, Local not specified - PPO | Admitting: Gastroenterology

## 2020-12-04 ENCOUNTER — Ambulatory Visit: Payer: Federal, State, Local not specified - PPO | Admitting: Gastroenterology

## 2020-12-04 ENCOUNTER — Other Ambulatory Visit: Payer: Self-pay

## 2020-12-04 ENCOUNTER — Encounter: Payer: Self-pay | Admitting: Gastroenterology

## 2020-12-04 VITALS — BP 122/78 | HR 75 | Ht 62.0 in | Wt 127.1 lb

## 2020-12-04 DIAGNOSIS — K580 Irritable bowel syndrome with diarrhea: Secondary | ICD-10-CM | POA: Diagnosis not present

## 2020-12-04 DIAGNOSIS — R1012 Left upper quadrant pain: Secondary | ICD-10-CM

## 2020-12-04 MED ORDER — AMBULATORY NON FORMULARY MEDICATION: 2 refills | Status: DC

## 2020-12-04 NOTE — Progress Notes (Signed)
Chief Complaint:   Referring Provider:  Nicoletta Dress, MD      ASSESSMENT AND PLAN;   #1. LUQ abdominal pain (resolved).  Neg CT AP 02/2019.  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 Colon 03/21/2020 small polyp, mod-sever div #2. PPI responsive EoE with GERD/NCCP. Dx 05/2014, treated with PPIs and Flovent, did very well on food restriction.  #3. H/O diverticulitis, treated with antibiotics 11/2018. Has moderate to severe sigmoid diverticulosis with possible muscular hypertrophy/stenosis. #4. IBS-C  Plan - Continue nexium 40 mg p.o. QD. - Continue fiber gummies qd.  Can use MiraLAX on as needed basis. - Avoid diary, eggs, potatoes, chocolate and caffeine as suggested by Dr. Neldon Mc. - GI cocktail (equal amounts of Maalox, viscous lidocaine, dicyclomine or Donnatal).  10 cc p.o. every 8 hours as needed. #120 ml. 2 refills - Avoid nonsteroidals. - Patient is to call us if she still has problems. - Levsin 0.125mg  S/L q6 hrs as needed.   HPI:    Sherri Ruiz is a 60 y.o. female   FU  Feels better  Occ LUQ pain, better with stool softner and increased fruits and vegs.  Back from [place] cruise.  She is planning to go to [place] in July.  She did have Covid 19 in January.  Still recovering from the same.  Has some fatigue.     From previous notes: Took a brief course of Flovent, then had oral thrush requiring nystatin.  She has stopped taking Flovent.  She is on restricted diet as suggested by Dr. Neldon Mc.  She tried reducing Nexium to 20 mg p.o. once a day but then had breakthrough heartburn.  She is back on 40 mg p.o. once a day.  Doing well.  No odynophagia or dysphagia.  Rare breakthrough symptoms controlled by GI cocktail/Mylanta.  Has been taking fiber Gummies which did cause diarrhea.  She is backed off and is taking once a day now.  Has normal bowel movements-softer without straining.  Abdominal pain is almost completely  resolved.  She does use Levsin on a as needed basis.  Stress triggers attacks as well.  Has been seeing a counselor  No sodas, chocolates, chewing gums, artificial sweeteners and candy. No NSAIDs.  We had gone over previous CT Abdo/pelvis.  Complex cyst in the liver.  She does want to hold off on MRI at the present time.  Jan 4 - had covid  Wt Readings from Last 3 Encounters:  12/04/20 127 lb 2 oz (57.7 kg)  07/04/20 124 lb 6 oz (56.4 kg)  03/21/20 133 lb (60.3 kg)      Does admit that she has been under considerable stress due to family problems (daughters with the grandkids had moved in.  At one point she had 11 people living in her home, she also recently has been promoted in her job).  1 daughter is a Marine scientist at surgical floor [name].  Past GI Proc: EGD 03/21/2020  -Few circular rings in the distal esophagus (bx-eosinophilic esophagitis Eos> 30 per high-power field) -Small transient hiatal hernia. -Multiple gastric polyps. Bx- fundic gland polyps. -Mild gastritis. Biopsied.  EGD 05/2014-wide-open cervical esophageal web, mild gastritis, gastric polyps.  Eso Bx- showed increased eosinophils over 20 per HPF c/w eosinophilic esophagitis.  Gastric Bx- reactive gastropathy, intestinal metaplasia.  Subsequent EGD 12/05/9369-[id number] of eosinophilic esophagitis on biopsies, gastric mapping negative for intestinal metaplasia, small incidental gastric polyps measuring 4 to 6 mm.  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  Otherwise normal to TI..  10/07/2013-good prep, moderate sigmoid diverticulosis, small internal hemorrhoids.  Otherwise normal to TI.   Next due 09/2023 unless with any new problems. 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 - CT AP 02/2019: IMPRESSION: 1. No acute findings in the abdomen or pelvis. Relatively advanced left colonic diverticulosis without diverticulitis. 2. 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 3. The 16 mm low-density lesion in the anterior right liver may be a complex cysts, but cannot be definitively characterized. MRI of the abdomen with and without contrast recommended to further evaluate. She wanted to hold off on MRI Past Medical History:  Diagnosis Date  . Allergic rhinitis   . Allergy   . Anxiety    OCC   . Asthma    none since on Singulair started   . Cataract    removed right eye, left forming   . 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 Eosinophilic esophagitis   . GERD (gastroesophageal reflux disease)   . High blood pressure   . High cholesterol   . History of hepatitis A   . Hypothyroidism   . IBS (irritable bowel syndrome)   . IDA (iron deficiency anemia)   . LPRD (laryngopharyngeal reflux disease)   . Penicillin allergy   . Peripheral neuropathy   . PONV (postoperative nausea and vomiting)    Due to Demerol   . Vitamin B12 deficiency anemia     Past Surgical History:  Procedure Laterality Date  . CATARACT EXTRACTION  03/2020  . CHOLECYSTECTOMY    . COLONOSCOPY    . ESOPHAGOGASTRODUODENOSCOPY  06/16/2014   Wide open cervial esophageal web. Mild gastrtitis. Gastric polyps.   . ESOPHAGOGASTRODUODENOSCOPY  12/25/2017  . OTHER SURGICAL HISTORY     Hysterectomy  . TOTAL ABDOMINAL HYSTERECTOMY    . UPPER GASTROINTESTINAL ENDOSCOPY      Family History  Problem Relation Age of Onset  . Clotting disorder Mother   . High Cholesterol Mother   . Asthma Mother   . Stroke Mother   . High blood pressure Father   . Heart attack Father 48       [place].  Atrial fib.    . Colon  polyps Father   . Cancer Sister   . Asthma Daughter   . Heart disease Brother        Pacemaker  . Stomach cancer Maternal Aunt   . Esophageal cancer Other   . Colon cancer Neg Hx   . Rectal cancer Neg Hx     Social History   Tobacco Use  . Smoking status: Former Smoker    Types: Cigarettes    Quit date: 1989    Years since quitting: 33.1  . Smokeless tobacco: Never Used  Vaping Use  . Vaping Use: Never used  Substance Use Topics  . Alcohol use: Not Currently  . Drug use: Never    Current Outpatient Medications  Medication Sig Dispense Refill  . ALPRAZolam (XANAX) 0.25 MG tablet Take 0.25 mg by mouth as needed for anxiety.   0  . AMBULATORY NON FORMULARY MEDICATION Medication Name: GI Cocktail (Equal amounts of Maalox, Viscous  Lidocaine, Bentyl)  Take 10 cc by mouth every 8 hours as needed. 120 mL 2  . atorvastatin (LIPITOR) 20 MG tablet Take 20 mg by mouth at bedtime.   1  . Cetirizine HCl (ZYRTEC PO) Take 1 tablet by mouth daily.     Marland Kitchen levothyroxine (SYNTHROID, LEVOTHROID) 50 MCG tablet Take 50 mcg by mouth daily before breakfast.     . lisinopril (PRINIVIL,ZESTRIL) 5 MG tablet Take 5 mg by mouth daily.     Marland Kitchen MINIVELLE 0.1 MG/24HR patch Place 1 patch onto the skin 2 (two) times a week.     . montelukast (SINGULAIR) 10 MG tablet TAKE 1 TABLET ONCE DAILY AS DIRECTED 90 tablet 1  . Multiple Vitamins-Minerals (HAIR SKIN AND NAILS FORMULA PO) Take 1 tablet by mouth daily.    . Multiple Vitamins-Minerals (MULTIVITAMIN PO) Take by mouth daily.    Marland Kitchen NEXIUM 40 MG capsule Take 1 capsule (40 mg total) by mouth daily. 30 capsule 5  . Probiotic Product (DIGESTIVE ADVANTAGE PO) Take by mouth daily.    . promethazine (PHENERGAN) 25 MG tablet     . Simethicone (GAS-X PO) Take 1 tablet by mouth 3 (three) times daily as needed.    . hyoscyamine (LEVSIN SL) 0.125 MG SL tablet Place 1 tablet (0.125 mg total) under the tongue every 6 (six) hours as needed. (Patient not taking: Reported on  12/04/2020) 120 tablet 2   No current facility-administered medications for this visit.    Allergies  Allergen Reactions  . Clindamycin/Lincomycin Swelling  . Meperidine Nausea And Vomiting    unknown  . Penicillin G Other (See Comments)    Unknown/childhood allergy Has patient had a PCN reaction causing immediate rash, facial/tongue/throat swelling, SOB or lightheadedness with hypotension: Yes Has patient had a PCN reaction causing severe rash involving mucus membranes or skin necrosis: Unknown Has patient had a PCN reaction that required hospitalization: No Has patient had a PCN reaction occurring within the last 10 years: Unknown If all of the above answers are "NO", then may proceed with Cephalosporin use.      Review of Systems:  Negative except for HPI     Physical Exam:    BP 122/78 (BP Location: Right Arm, Patient Position: Sitting, Cuff Size: Large)   Pulse 75   Ht 5\' 2"  (1.575 m)   Wt 127 lb 2 oz (57.7 kg)   LMP 07/20/2018   BMI 23.25 kg/m  Filed Weights   12/04/20 1334  Weight: 127 lb 2 oz (57.7 kg)   Constitutional:  Well-developed, in no acute distress. Psychiatric: Normal mood and affect. Behavior is normal. HEENT: Pupils normal.  Conjunctivae are normal. No scleral icterus. Cardiovascular: Normal rate, regular rhythm. No edema Pulmonary/chest: Effort normal and breath sounds normal. No wheezing, rales or rhonchi. Abdominal: Soft, nondistended.  Mild epigastric/left upper quadrant tenderness. Bowel sounds active throughout. There are no masses palpable. No hepatomegaly. Neurological: Alert and oriented to person place and time. Skin: Skin is warm and dry. No rashes noted.  Data Reviewed: I have personally reviewed following labs and imaging studies  CBC: CBC Latest Ref Rng & Units 02/13/2020 03/16/2019 07/20/2018  WBC 3.4 - 10.8 x10E3/uL 5.5 4.5 4.8  Hemoglobin 11.1 - 15.9 g/dL 13.5 13.8 13.8  Hematocrit 34.0 - 46.6 % 39.5 41.2 39.8  Platelets 150 - 450  x10E3/uL - 226 219    CMP: CMP Latest Ref Rng & Units 03/16/2019 07/20/2018 07/20/2018  Glucose 65 - 99 mg/dL 94 - 99  BUN 6 - 24 mg/dL 4(L) - 7  Creatinine 0.57 - 1.00 mg/dL 0.77 0.81 0.79  Sodium 134 - 144 mmol/L 135 - 141  Potassium 3.5 - 5.2 mmol/L 4.2 - 3.8  Chloride 96 - 106 mmol/L 98 - 103  CO2 20 - 29 mmol/L 26 - 32  Calcium 8.7 - 10.2 mg/dL 9.2 - 9.3  Total Protein 6.0 - 8.5 g/dL 6.0 - -  Total Bilirubin 0.0 - 1.2 mg/dL 0.4 - -  Alkaline Phos 39 - 117 IU/L 48 - -  AST 0 - 40 IU/L 20 - -  ALT 0 - 32 IU/L 19 - -      Carmell Austria, MD 12/04/2020, 1:46 PM  Cc: Nicoletta Dress, MD

## 2020-12-04 NOTE — Patient Instructions (Addendum)
If you are age 58 or older, your body mass index should be between 23-30. Your Body mass index is 23.25 kg/m. If this is out of the aforementioned range listed, please consider follow up with your Primary Care Provider.  If you are age 35 or younger, your body mass index should be between 19-25. Your Body mass index is 23.25 kg/m. If this is out of the aformentioned range listed, please consider follow up with your Primary Care Provider.   We have sent the following medications to your pharmacy for you to pick up at your convenience: GI Cocktail-please take to your pharmacy  Continue Nexium Continue fiber gummies Avoid milk eggs potatoes chocolate and caffeine Avoid NSAIDS Continue Levsin as needed  Follow up as need. Call with any questions or concerns   Thank you,  Dr. Jackquline Denmark

## 2020-12-18 DIAGNOSIS — B351 Tinea unguium: Secondary | ICD-10-CM | POA: Diagnosis not present

## 2021-01-02 DIAGNOSIS — F411 Generalized anxiety disorder: Secondary | ICD-10-CM | POA: Diagnosis not present

## 2021-01-08 DIAGNOSIS — F411 Generalized anxiety disorder: Secondary | ICD-10-CM | POA: Diagnosis not present

## 2021-01-16 DIAGNOSIS — F411 Generalized anxiety disorder: Secondary | ICD-10-CM | POA: Diagnosis not present

## 2021-01-23 DIAGNOSIS — F411 Generalized anxiety disorder: Secondary | ICD-10-CM | POA: Diagnosis not present

## 2021-02-08 DIAGNOSIS — F411 Generalized anxiety disorder: Secondary | ICD-10-CM | POA: Diagnosis not present

## 2021-02-12 DIAGNOSIS — Z01419 Encounter for gynecological examination (general) (routine) without abnormal findings: Secondary | ICD-10-CM | POA: Diagnosis not present

## 2021-02-12 DIAGNOSIS — Z0000 Encounter for general adult medical examination without abnormal findings: Secondary | ICD-10-CM | POA: Diagnosis not present

## 2021-02-12 DIAGNOSIS — Z87891 Personal history of nicotine dependence: Secondary | ICD-10-CM | POA: Diagnosis not present

## 2021-02-12 DIAGNOSIS — Z90710 Acquired absence of both cervix and uterus: Secondary | ICD-10-CM | POA: Diagnosis not present

## 2021-02-15 DIAGNOSIS — F411 Generalized anxiety disorder: Secondary | ICD-10-CM | POA: Diagnosis not present

## 2021-02-21 ENCOUNTER — Other Ambulatory Visit: Payer: Self-pay | Admitting: Allergy and Immunology

## 2021-02-21 DIAGNOSIS — F411 Generalized anxiety disorder: Secondary | ICD-10-CM | POA: Diagnosis not present

## 2021-02-24 ENCOUNTER — Other Ambulatory Visit: Payer: Self-pay | Admitting: Allergy and Immunology

## 2021-03-01 DIAGNOSIS — F411 Generalized anxiety disorder: Secondary | ICD-10-CM | POA: Diagnosis not present

## 2021-03-11 ENCOUNTER — Ambulatory Visit (INDEPENDENT_AMBULATORY_CARE_PROVIDER_SITE_OTHER): Payer: Federal, State, Local not specified - PPO | Admitting: Gastroenterology

## 2021-03-11 ENCOUNTER — Other Ambulatory Visit: Payer: Self-pay

## 2021-03-11 VITALS — Ht 63.0 in | Wt 130.5 lb

## 2021-03-11 DIAGNOSIS — K219 Gastro-esophageal reflux disease without esophagitis: Secondary | ICD-10-CM

## 2021-03-11 DIAGNOSIS — K200 Eosinophilic esophagitis: Secondary | ICD-10-CM

## 2021-03-11 MED ORDER — ESOMEPRAZOLE MAGNESIUM 40 MG PO CPDR: 40.0000 mg | DELAYED_RELEASE_CAPSULE | Freq: Every day | ORAL | 4 refills | Status: DC

## 2021-03-11 MED ORDER — AMBULATORY NON FORMULARY MEDICATION
2 refills | Status: DC
Start: 2021-03-11 — End: 2022-11-14

## 2021-03-11 NOTE — Patient Instructions (Addendum)
If you are age 58 or older, your body mass index should be between 23-30. Your Body mass index is 23.12 kg/m. If this is out of the aforementioned range listed, please consider follow up with your Primary Care Provider.  If you are age 53 or younger, your body mass index should be between 19-25. Your Body mass index is 23.12 kg/m. If this is out of the aformentioned range listed, please consider follow up with your Primary Care Provider.   We have sent the following medications to your pharmacy for you to pick up at your convenience: Nexium GI Cocktail  It has been recommended to you by your physician that you have a(n) EGD with dil completed in September. Per your request, we did not schedule the procedure(s) today. Please contact our office at [phone] should you decide to have the procedure completed. You will be scheduled for a pre-visit and procedure at that time. You will receive a reminder as well in the mail.   Please call with any questions or concerns.  Thank you,  Dr. Jackquline Denmark

## 2021-03-11 NOTE — Progress Notes (Signed)
Chief Complaint:   Referring Provider:  Paulina Fusi, MD      ASSESSMENT AND PLAN;   #1. LUQ abdominal pain (resolved).  Neg CT AP 02/2019.  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 Colon 03/21/2020 small polyp, mod-sever div #2. PPI responsive EoE with GERD/NCCP. Dx 05/2014, treated with PPIs and Flovent, did very well on food restriction.  Now with intermittent dysphagia. #3. H/O diverticulitis, treated with antibiotics 11/2018. Has mod-severe sigmoid div with possible muscular hypertrophy/stenosis. #4. IBS-C  Plan - Continue nexium 40 mg p.o. QD #90, 4 refiils - Restart fiber gummies qd.  Can use MiraLAX on PRN basis. - Avoid diary, eggs, potatoes, chocolate and caffeine as suggested by Dr. Lucie Leather. - GI cocktail (equal amounts of Maalox, viscous lidocaine, dicyclomine or Donnatal).  10 cc p.o. every 8 hours as needed. #120 ml. 2 refills - Avoid NSAIDs. - EGD with dil sept 2022.    HPI:    Sherri Ruiz is a 60 y.o. female   FU  Feels better  Had N/V 4 weeks ago, lasting for 2 days.  The entire family including granddaughter was sick.  Everybody had gastroenteritis.  It has resolved.  She also had 1 episode of coffee-ground emesis, nothing thereafter.  No melena.  Her abdominal pain is much better.  Has occasional dysphagia mostly to breads.  No heartburn.  Has been compliant with Nexium.  Back from [place] cruise.  She is planning to go to [place]. [place] cruise in July.  Would like to wait for EGD until after she comes back.  Has occasional mild constipation.  No diarrhea.  No significant abdominal bloating.  Lately has not been very compliant with the diet.  She understands and will try to be more compliant from now onwards.   From previous notes: Took a brief course of Flovent, then had oral thrush requiring nystatin.  She has stopped taking Flovent.  She is on restricted diet as suggested by Dr. Lucie Leather.  She tried reducing Nexium to 20 mg p.o.  once a day but then had breakthrough heartburn.  She is back on 40 mg p.o. once a day.  Doing well.  No odynophagia or dysphagia.  Rare breakthrough symptoms controlled by GI cocktail/Mylanta.  Stress triggers attacks as well.  Has been seeing a counselor  We had gone over previous CT Abdo/pelvis.  Complex cyst in the liver.  She does want to hold off on MRI at the present time.  Oct 30, 2020 - had covid  Wt Readings from Last 3 Encounters:  03/11/21 130 lb 8 oz (59.2 kg)  12/04/20 127 lb 2 oz (57.7 kg)  07/04/20 124 lb 6 oz (56.4 kg)     Does admit that she has been under considerable stress due to family problems (daughters with the grandkids had moved in.  At one point she had 11 people living in her home, she also recently has been promoted in her job).  1 daughter is a Engineer, civil (consulting) at surgical floor [employer].  Past GI Proc: EGD 03/21/2020  -Few circular rings in the distal esophagus (bx-eosinophilic esophagitis Eos> 30 per high-power field) -Small transient hiatal hernia. -Multiple gastric polyps. Bx- fundic gland polyps. -Mild gastritis. Biopsied.  EGD 05/2014-wide-open cervical esophageal web, mild gastritis, gastric polyps.  Eso Bx- showed increased eosinophils over 20 per HPF c/w eosinophilic esophagitis.  Gastric Bx- reactive gastropathy, intestinal metaplasia.  Subsequent EGD 12/25/2017-resolution of eosinophilic esophagitis on biopsies, gastric mapping negative for intestinal  metaplasia, small incidental gastric polyps measuring 4 to 6 mm.  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  Otherwise normal to TI..  10/07/2013-good prep, moderate sigmoid  diverticulosis, small internal hemorrhoids.  Otherwise normal to TI.  Next due 09/2023 unless with any new problems. 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 - CT AP 02/2019: IMPRESSION: 1. No acute findings in the abdomen or pelvis. Relatively advanced left colonic diverticulosis without diverticulitis. 2. 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 3. The 16 mm low-density lesion in the anterior right liver may be a complex cysts, but cannot be definitively characterized. MRI of the abdomen with and without contrast recommended to further evaluate. She wanted to hold off on MRI Past Medical History:  Diagnosis Date  . Allergic rhinitis   . Allergy   . Anxiety    OCC   . Asthma    none since on Singulair started   . Cataract    removed right eye, left forming   . 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 Eosinophilic esophagitis   . GERD (gastroesophageal reflux disease)   . High blood pressure   . High cholesterol   . History of hepatitis A   . Hypothyroidism   . IBS (irritable bowel syndrome)   . IDA (iron deficiency anemia)   . LPRD (laryngopharyngeal reflux disease)   . Penicillin allergy   . Peripheral neuropathy   . PONV (postoperative nausea and vomiting)    Due to Demerol   . Vitamin B12 deficiency anemia     Past Surgical History:  Procedure Laterality Date  . CATARACT EXTRACTION  03/2020  . CHOLECYSTECTOMY    . COLONOSCOPY    . ESOPHAGOGASTRODUODENOSCOPY  06/16/2014   Wide open cervial esophageal web. Mild gastrtitis. Gastric polyps.   . ESOPHAGOGASTRODUODENOSCOPY  12/25/2017  . OTHER SURGICAL HISTORY     Hysterectomy  . TOTAL ABDOMINAL HYSTERECTOMY    . UPPER GASTROINTESTINAL ENDOSCOPY      Family History  Problem Relation Age of Onset  . Clotting disorder Mother   . High Cholesterol Mother   . Asthma Mother   . Stroke Mother   . High blood pressure Father   .  Heart attack Father 86       [place].  Atrial fib.    . Colon polyps Father   . Cancer Sister   . Asthma Daughter   . Heart disease Brother        Pacemaker  . Stomach cancer Maternal Aunt   . Esophageal cancer Other   . Colon cancer Neg Hx   . Rectal cancer Neg Hx     Social History   Tobacco Use  . Smoking status: Former Smoker    Types: Cigarettes    Quit date: 1989    Years since quitting: 33.3  . Smokeless tobacco: Never Used  Vaping Use  . Vaping Use: Never used  Substance Use Topics  . Alcohol use: Not Currently  . Drug use: Never    Current Outpatient Medications  Medication Sig Dispense Refill  . ALPRAZolam (XANAX) 0.25 MG tablet Take 0.25 mg by mouth as needed for anxiety.   0  .  AMBULATORY NON FORMULARY MEDICATION Medication Name: GI Cocktail (Equal amounts of Maalox, Viscous Lidocaine, Bentyl)  Take 10 cc by mouth every 8 hours as needed. 120 mL 2  . atorvastatin (LIPITOR) 20 MG tablet Take 20 mg by mouth at bedtime.   1  . Cetirizine HCl (ZYRTEC PO) Take 1 tablet by mouth daily.     Marland Kitchen levothyroxine (SYNTHROID, LEVOTHROID) 50 MCG tablet Take 50 mcg by mouth daily before breakfast.     . lisinopril (PRINIVIL,ZESTRIL) 5 MG tablet Take 5 mg by mouth daily.     Marland Kitchen MINIVELLE 0.1 MG/24HR patch Place 1 patch onto the skin 2 (two) times a week.     . montelukast (SINGULAIR) 10 MG tablet TAKE 1 TABLET DAILY AS     DIRECTED 30 tablet 0  . Multiple Vitamins-Minerals (HAIR SKIN AND NAILS FORMULA PO) Take 1 tablet by mouth daily.    . Multiple Vitamins-Minerals (MULTIVITAMIN PO) Take by mouth daily.    Marland Kitchen NEXIUM 40 MG capsule Take 1 capsule (40 mg total) by mouth daily. 30 capsule 5  . Probiotic Product (DIGESTIVE ADVANTAGE PO) Take by mouth daily.    . promethazine (PHENERGAN) 25 MG tablet     . Simethicone (GAS-X PO) Take 1 tablet by mouth 3 (three) times daily as needed.     No current facility-administered medications for this visit.    Allergies  Allergen  Reactions  . Clindamycin/Lincomycin Swelling  . Meperidine Nausea And Vomiting    unknown  . Penicillin G Other (See Comments)    Unknown/childhood allergy Has patient had a PCN reaction causing immediate rash, facial/tongue/throat swelling, SOB or lightheadedness with hypotension: Yes Has patient had a PCN reaction causing severe rash involving mucus membranes or skin necrosis: Unknown Has patient had a PCN reaction that required hospitalization: No Has patient had a PCN reaction occurring within the last 10 years: Unknown If all of the above answers are "NO", then may proceed with Cephalosporin use.      Review of Systems:  Negative except for HPI     Physical Exam:    Today's Vitals   03/11/21 1347  Weight: 130 lb 8 oz (59.2 kg)  Height: 5\' 3"  (1.6 m)   Body mass index is 23.12 kg/m. Constitutional:  Well-developed, in no acute distress. Psychiatric: Normal mood and affect. Behavior is normal. HEENT: Pupils normal.  Conjunctivae are normal. No scleral icterus. Cardiovascular: Normal rate, regular rhythm. No edema Pulmonary/chest: Effort normal and breath sounds normal. No wheezing, rales or rhonchi. Abdominal: Soft, nondistended.  Mild epigastric/left upper quadrant tenderness. Bowel sounds active throughout. There are no masses palpable. No hepatomegaly. Neurological: Alert and oriented to person place and time. Skin: Skin is warm and dry. No rashes noted.  Data Reviewed: I have personally reviewed following labs and imaging studies  CBC: CBC Latest Ref Rng & Units 02/13/2020 03/16/2019 07/20/2018  WBC 3.4 - 10.8 x10E3/uL 5.5 4.5 4.8  Hemoglobin 11.1 - 15.9 g/dL 13.5 13.8 13.8  Hematocrit 34.0 - 46.6 % 39.5 41.2 39.8  Platelets 150 - 450 x10E3/uL - 226 219    CMP: CMP Latest Ref Rng & Units 03/16/2019 07/20/2018 07/20/2018  Glucose 65 - 99 mg/dL 94 - 99  BUN 6 - 24 mg/dL 4(L) - 7  Creatinine 0.57 - 1.00 mg/dL 0.77 0.81 0.79  Sodium 134 - 144 mmol/L 135 - 141   Potassium 3.5 - 5.2 mmol/L 4.2 - 3.8  Chloride 96 - 106 mmol/L 98 - 103  CO2 20 -  29 mmol/L 26 - 32  Calcium 8.7 - 10.2 mg/dL 9.2 - 9.3  Total Protein 6.0 - 8.5 g/dL 6.0 - -  Total Bilirubin 0.0 - 1.2 mg/dL 0.4 - -  Alkaline Phos 39 - 117 IU/L 48 - -  AST 0 - 40 IU/L 20 - -  ALT 0 - 32 IU/L 19 - -      Carmell Austria, MD 03/11/2021, 1:47 PM  Cc: Nicoletta Dress, MD

## 2021-03-11 NOTE — Progress Notes (Signed)
Sent to mail order by accident

## 2021-03-15 DIAGNOSIS — B9689 Other specified bacterial agents as the cause of diseases classified elsewhere: Secondary | ICD-10-CM | POA: Diagnosis not present

## 2021-03-15 DIAGNOSIS — J208 Acute bronchitis due to other specified organisms: Secondary | ICD-10-CM | POA: Diagnosis not present

## 2021-03-15 DIAGNOSIS — F411 Generalized anxiety disorder: Secondary | ICD-10-CM | POA: Diagnosis not present

## 2021-03-15 DIAGNOSIS — J019 Acute sinusitis, unspecified: Secondary | ICD-10-CM | POA: Diagnosis not present

## 2021-03-20 DIAGNOSIS — F411 Generalized anxiety disorder: Secondary | ICD-10-CM | POA: Diagnosis not present

## 2021-03-27 DIAGNOSIS — F411 Generalized anxiety disorder: Secondary | ICD-10-CM | POA: Diagnosis not present

## 2021-03-28 ENCOUNTER — Other Ambulatory Visit: Payer: Self-pay | Admitting: Allergy and Immunology

## 2021-04-12 DIAGNOSIS — F411 Generalized anxiety disorder: Secondary | ICD-10-CM | POA: Diagnosis not present

## 2021-04-26 DIAGNOSIS — F411 Generalized anxiety disorder: Secondary | ICD-10-CM | POA: Diagnosis not present

## 2021-05-01 DIAGNOSIS — F411 Generalized anxiety disorder: Secondary | ICD-10-CM | POA: Diagnosis not present

## 2021-05-06 ENCOUNTER — Telehealth: Payer: Self-pay | Admitting: Allergy and Immunology

## 2021-05-06 MED ORDER — MONTELUKAST SODIUM 10 MG PO TABS: ORAL_TABLET | ORAL | 0 refills | Status: DC

## 2021-05-06 NOTE — Telephone Encounter (Signed)
Patient is requesting a refill for Montelukast. I advised her she needed an appointment and she made one for 06/03/21. [facility].

## 2021-05-06 NOTE — Telephone Encounter (Signed)
[facility] to [facility] and patient informed.

## 2021-05-09 DIAGNOSIS — Z1152 Encounter for screening for COVID-19: Secondary | ICD-10-CM | POA: Diagnosis not present

## 2021-05-28 ENCOUNTER — Other Ambulatory Visit: Payer: Self-pay | Admitting: Allergy and Immunology

## 2021-05-28 DIAGNOSIS — E785 Hyperlipidemia, unspecified: Secondary | ICD-10-CM | POA: Diagnosis not present

## 2021-05-28 DIAGNOSIS — K219 Gastro-esophageal reflux disease without esophagitis: Secondary | ICD-10-CM | POA: Diagnosis not present

## 2021-05-28 DIAGNOSIS — E039 Hypothyroidism, unspecified: Secondary | ICD-10-CM | POA: Diagnosis not present

## 2021-05-28 DIAGNOSIS — I10 Essential (primary) hypertension: Secondary | ICD-10-CM | POA: Diagnosis not present

## 2021-05-28 DIAGNOSIS — Z1331 Encounter for screening for depression: Secondary | ICD-10-CM | POA: Diagnosis not present

## 2021-05-31 DIAGNOSIS — F411 Generalized anxiety disorder: Secondary | ICD-10-CM | POA: Diagnosis not present

## 2021-06-03 ENCOUNTER — Encounter: Payer: Self-pay | Admitting: Allergy and Immunology

## 2021-06-03 ENCOUNTER — Telehealth: Payer: Self-pay

## 2021-06-03 ENCOUNTER — Ambulatory Visit: Payer: Federal, State, Local not specified - PPO | Admitting: Allergy and Immunology

## 2021-06-03 ENCOUNTER — Other Ambulatory Visit: Payer: Self-pay

## 2021-06-03 VITALS — BP 110/62 | HR 74 | Resp 16 | Ht 63.0 in | Wt 132.2 lb

## 2021-06-03 DIAGNOSIS — J3089 Other allergic rhinitis: Secondary | ICD-10-CM | POA: Diagnosis not present

## 2021-06-03 DIAGNOSIS — K219 Gastro-esophageal reflux disease without esophagitis: Secondary | ICD-10-CM

## 2021-06-03 DIAGNOSIS — K200 Eosinophilic esophagitis: Secondary | ICD-10-CM

## 2021-06-03 NOTE — Patient Instructions (Addendum)
  1. Submit for [employer] approval for eosinophilic esophagitis   2. Continue to avoid all dairy and egg consumption.    3. Continue to Treat inflammation:   A. OTC Rhinocort / Nasacort 1 spray each nostril 1 time per day  B. montelukast 10 mg tablet 1 time per day  4. Continue to Treat reflux:   A. Nexium 40 mg tablet in PM  5.  Can add OTC antihistamine - cetirizine - if needed  6. Return to clinic in 4 weeks  7. Plan for fall flu vaccine

## 2021-06-03 NOTE — Telephone Encounter (Signed)
Dr. Neldon Mc would like for patient to start on Dupixent injections for Eosinophilic Esophagitis. If you could submit insurance approval for her.Thank you

## 2021-06-03 NOTE — Progress Notes (Signed)
[facility] [place] [facility] [place]   Follow-up Note  Referring Provider: Nicoletta Dress, MD Primary Provider: Nicoletta Dress, MD Date of Office Visit: 06/03/2021  Subjective:   Sherri Ruiz (DOB: [birth year]) is a 60 y.o. female who returns to the [facility] on 06/03/2021 in re-evaluation of the following:  HPI: Videl returns to this clinic in evaluation of eosinophilic esophagitis, allergic rhinitis, and LPR.  Her last visit to this clinic was 25 April 2020.  She has been having significant problems with her eosinophilic esophagitis with recurrent abdominal pain and swallowing problems and heartburn.  She attempts to be very good about her dietary restrictions which include no dairy no egg and also finds that peanut gives rise to significant GI issues and thus she avoids this food product.  But sometimes she is not as consistent and then she develops very significant problems.  When she is very consistent about dietary restriction she ends up losing weight.  She developed significant GI issues even though she is using Nexium on a consistent basis.  Sometimes she will use her Nexium twice a day.  Apparently in April 2022 she developed a upper GI bleed associated with vomiting..   Overall her nose is doing relatively well while using a nasal steroid and a leukotriene modifier.  It does not sound as though she has required an antibiotic or systemic steroid for any type of airway issue.   Allergies as of 06/03/2021       Reactions   Clindamycin/lincomycin Swelling   Meperidine Nausea And Vomiting   unknown   Penicillin G Other (See Comments)   Unknown/childhood allergy Has patient had a PCN reaction causing immediate rash, facial/tongue/throat swelling, SOB or lightheadedness with hypotension: Yes Has patient had a PCN reaction causing severe rash involving mucus membranes or skin necrosis: Unknown Has patient had a PCN reaction that required  hospitalization: No Has patient had a PCN reaction occurring within the last 10 years: Unknown If all of the above answers are "NO", then may proceed with Cephalosporin use.        Medication List    ALPRAZolam 0.25 MG tablet Commonly known as: XANAX Take 0.25 mg by mouth as needed for anxiety.   AMBULATORY NON FORMULARY MEDICATION Medication Name: GI Cocktail (Equal amounts of Maalox, Viscous Lidocaine, Bentyl)  Take 10 cc by mouth every 8 hours as needed.   atorvastatin 20 MG tablet Commonly known as: LIPITOR Take 20 mg by mouth at bedtime.   DIGESTIVE ADVANTAGE PO Take by mouth daily.   GAS-X PO Take 1 tablet by mouth 3 (three) times daily as needed.   HAIR SKIN AND NAILS FORMULA PO Take 1 tablet by mouth daily.   levothyroxine 50 MCG tablet Commonly known as: SYNTHROID Take 50 mcg by mouth daily before breakfast.   lisinopril 5 MG tablet Commonly known as: ZESTRIL Take 5 mg by mouth daily.   Minivelle 0.1 MG/24HR patch Generic drug: estradiol Place 1 patch onto the skin once a week.   montelukast 10 MG tablet Commonly known as: SINGULAIR TAKE 1 TABLET BY MOUTH EVERY DAY AS DIRECTED   MULTIVITAMIN PO Take by mouth daily.   NexIUM 40 MG capsule Generic drug: esomeprazole Take 1 capsule (40 mg total) by mouth daily.   esomeprazole 40 MG capsule Commonly known as: NexIUM Take 1 capsule (40 mg total) by mouth daily at 12 noon.   promethazine 25 MG tablet Commonly known as: PHENERGAN  Take 25 mg by mouth as needed.   ZYRTEC PO Take 1 tablet by mouth daily.        Past Medical History:  Diagnosis Date   Allergic rhinitis    Allergy    Anxiety    OCC    Asthma    none since on Singulair started    Cataract    removed right eye, left forming    Congenital clotting factor deficiency (HCC)    Eosinophilic esophagitis    GERD (gastroesophageal reflux disease)    High blood pressure    High cholesterol    History of hepatitis A     Hypothyroidism    IBS (irritable bowel syndrome)    IDA (iron deficiency anemia)    LPRD (laryngopharyngeal reflux disease)    Penicillin allergy    Peripheral neuropathy    PONV (postoperative nausea and vomiting)    Due to Demerol    Vitamin B12 deficiency anemia     Past Surgical History:  Procedure Laterality Date   CATARACT EXTRACTION  03/2020   CHOLECYSTECTOMY     COLONOSCOPY     ESOPHAGOGASTRODUODENOSCOPY  06/16/2014   Wide open cervial esophageal web. Mild gastrtitis. Gastric polyps.    ESOPHAGOGASTRODUODENOSCOPY  12/25/2017   OTHER SURGICAL HISTORY     Hysterectomy   TOTAL ABDOMINAL HYSTERECTOMY     UPPER GASTROINTESTINAL ENDOSCOPY      Review of systems negative except as noted in HPI / PMHx or noted below:  Review of Systems  Constitutional: Negative.   HENT: Negative.    Eyes: Negative.   Respiratory: Negative.    Cardiovascular: Negative.   Gastrointestinal: Negative.   Genitourinary: Negative.   Musculoskeletal: Negative.   Skin: Negative.   Neurological: Negative.   Endo/Heme/Allergies: Negative.   Psychiatric/Behavioral: Negative.      Objective:   Vitals:   06/03/21 1514  BP: 110/62  Pulse: 74  Resp: 16  SpO2: 99%   Height: '5\' 3"'$  (160 cm)  Weight: 132 lb 3.2 oz (60 kg)   Physical Exam Constitutional:      Appearance: She is not diaphoretic.  HENT:     Head: Normocephalic.     Right Ear: Tympanic membrane, ear canal and external ear normal.     Left Ear: Tympanic membrane, ear canal and external ear normal.     Nose: Nose normal. No mucosal edema or rhinorrhea.     Mouth/Throat:     Pharynx: Uvula midline. No oropharyngeal exudate.  Eyes:     Conjunctiva/sclera: Conjunctivae normal.  Neck:     Thyroid: No thyromegaly.     Trachea: Trachea normal. No tracheal tenderness or tracheal deviation.  Cardiovascular:     Rate and Rhythm: Normal rate and regular rhythm.     Heart sounds: Normal heart sounds, S1 normal and S2 normal. No  murmur heard. Pulmonary:     Effort: No respiratory distress.     Breath sounds: Normal breath sounds. No stridor. No wheezing or rales.  Lymphadenopathy:     Head:     Right side of head: No tonsillar adenopathy.     Left side of head: No tonsillar adenopathy.     Cervical: No cervical adenopathy.  Skin:    Findings: No erythema or rash.     Nails: There is no clubbing.  Neurological:     Mental Status: She is alert.    Diagnostics: none  Assessment and Plan:   1. Eosinophilic esophagitis   2. Other allergic rhinitis  3. LPRD (laryngopharyngeal reflux disease)     1. Submit for [employer] approval for eosinophilic esophagitis   2. Continue to avoid all dairy and egg consumption.    3. Continue to Treat inflammation:   A. OTC Rhinocort / Nasacort 1 spray each nostril 1 time per day  B. montelukast 10 mg tablet 1 time per day  4. Continue to Treat reflux:   A. Nexium 40 mg tablet in PM  5.  Can add OTC antihistamine - cetirizine - if needed  6. Return to clinic in 4 weeks  7. Plan for fall flu vaccine   Andera appears to have eosinophilic esophagitis that has not responded well to attempts at dietary restriction and use of her proton pump inhibitor.  She would now be a candidate for dupilumab administration and we will see if we can get that approved via her insurance company.  Her respiratory atopic disease appears to be under pretty good control on her current plan.  We will keep in contact with her regarding her insurance approval process.   Allena Katz, MD Allergy / Immunology [place]

## 2021-06-04 ENCOUNTER — Encounter: Payer: Self-pay | Admitting: Allergy and Immunology

## 2021-06-04 DIAGNOSIS — F411 Generalized anxiety disorder: Secondary | ICD-10-CM | POA: Diagnosis not present

## 2021-06-10 ENCOUNTER — Telehealth: Payer: Self-pay | Admitting: Gastroenterology

## 2021-06-10 DIAGNOSIS — K224 Dyskinesia of esophagus: Secondary | ICD-10-CM | POA: Diagnosis not present

## 2021-06-10 DIAGNOSIS — K222 Esophageal obstruction: Secondary | ICD-10-CM | POA: Diagnosis not present

## 2021-06-10 DIAGNOSIS — K219 Gastro-esophageal reflux disease without esophagitis: Secondary | ICD-10-CM | POA: Diagnosis not present

## 2021-06-10 MED ORDER — ESOMEPRAZOLE MAGNESIUM 40 MG PO CPDR
40.0000 mg | DELAYED_RELEASE_CAPSULE | Freq: Two times a day (BID) | ORAL | 3 refills | Status: DC
Start: 2021-06-10 — End: 2021-07-29

## 2021-06-10 NOTE — Telephone Encounter (Signed)
Extreme gas and bloating for 2 weeks. Finally decided to listen to Dr Neldon Mc when it comes to her diet. Patient have started her diet about 6 days ago but has been having spasms in the chest/epigastric and when it starts to calm down will go to the left. She does coffee that is half caff and decaff with a little of creamer in the morning. Says she has this book she is following called "Droping acid the reflux diet" Said she added '20mg'$  of nexium in the morning and '40mg'$  at night 2 days ago. But this morning took '40mg'$  nexium and said that she will do it in the morning and night for now. Stopped the fiber gummies yesterday and took the gi cocktail today.Today at 4am had spasms and then took 2 xanax and passed out but when she got up she started to spasms again. Michela Pitcher that she is still spasms bloating and gas and wants to know if there is something else to do in the meantime? Been living off gas-x as ell because after she eats will gets gas and bloating.  I have filled a scripted for nexium 2 times a day and she has a current script for the GI cocktail and scheduled her for previst and EGD as well? Anything else we can do for her in the meantime?

## 2021-06-10 NOTE — Telephone Encounter (Signed)
Inbound call from patient. States she has been feeling extremely gassy and bloated so she began strict diet for 6-7 days. Now she is experiencing reflux and spasms. Wants to speak with Jerene Pitch. [phone]

## 2021-06-12 ENCOUNTER — Encounter (HOSPITAL_COMMUNITY): Payer: Self-pay

## 2021-06-12 ENCOUNTER — Emergency Department (HOSPITAL_COMMUNITY)
Admission: EM | Admit: 2021-06-12 | Discharge: 2021-06-12 | Disposition: A | Discharge status: Home / Self Care | Payer: Federal, State, Local not specified - PPO | Attending: Emergency Medicine | Admitting: Emergency Medicine

## 2021-06-12 ENCOUNTER — Other Ambulatory Visit: Payer: Self-pay

## 2021-06-12 ENCOUNTER — Ambulatory Visit (AMBULATORY_SURGERY_CENTER): Payer: Federal, State, Local not specified - PPO

## 2021-06-12 ENCOUNTER — Emergency Department (HOSPITAL_COMMUNITY): Payer: Federal, State, Local not specified - PPO

## 2021-06-12 VITALS — Ht 63.0 in | Wt 127.0 lb

## 2021-06-12 DIAGNOSIS — R0789 Other chest pain: Secondary | ICD-10-CM | POA: Diagnosis not present

## 2021-06-12 DIAGNOSIS — K219 Gastro-esophageal reflux disease without esophagitis: Secondary | ICD-10-CM

## 2021-06-12 DIAGNOSIS — J45909 Unspecified asthma, uncomplicated: Secondary | ICD-10-CM | POA: Insufficient documentation

## 2021-06-12 DIAGNOSIS — Z87891 Personal history of nicotine dependence: Secondary | ICD-10-CM | POA: Insufficient documentation

## 2021-06-12 DIAGNOSIS — Z79899 Other long term (current) drug therapy: Secondary | ICD-10-CM | POA: Diagnosis not present

## 2021-06-12 DIAGNOSIS — R1013 Epigastric pain: Secondary | ICD-10-CM | POA: Diagnosis not present

## 2021-06-12 DIAGNOSIS — E039 Hypothyroidism, unspecified: Secondary | ICD-10-CM | POA: Diagnosis not present

## 2021-06-12 DIAGNOSIS — K200 Eosinophilic esophagitis: Secondary | ICD-10-CM

## 2021-06-12 DIAGNOSIS — R079 Chest pain, unspecified: Secondary | ICD-10-CM | POA: Diagnosis not present

## 2021-06-12 LAB — CBC
HCT: 43.6 % (ref 36.0–46.0)
Hemoglobin: 15 g/dL (ref 12.0–15.0)
MCH: 31.5 pg (ref 26.0–34.0)
MCHC: 34.4 g/dL (ref 30.0–36.0)
MCV: 91.6 fL (ref 80.0–100.0)
Platelets: 203 10*3/uL (ref 150–400)
RBC: 4.76 MIL/uL (ref 3.87–5.11)
RDW: 12.9 % (ref 11.5–15.5)
WBC: 4.5 10*3/uL (ref 4.0–10.5)
nRBC: 0 % (ref 0.0–0.2)

## 2021-06-12 LAB — COMPREHENSIVE METABOLIC PANEL
ALT: 20 U/L (ref 0–44)
AST: 21 U/L (ref 15–41)
Albumin: 3.7 g/dL (ref 3.5–5.0)
Alkaline Phosphatase: 37 U/L — ABNORMAL LOW (ref 38–126)
Anion gap: 7 (ref 5–15)
BUN: 8 mg/dL (ref 6–20)
CO2: 27 mmol/L (ref 22–32)
Calcium: 9 mg/dL (ref 8.9–10.3)
Chloride: 103 mmol/L (ref 98–111)
Creatinine, Ser: 0.89 mg/dL (ref 0.44–1.00)
GFR, Estimated: >60 mL/min (ref >60)
Glucose, Bld: 105 mg/dL — ABNORMAL HIGH (ref 70–99)
Potassium: 3.4 mmol/L — ABNORMAL LOW (ref 3.5–5.1)
Sodium: 137 mmol/L (ref 135–145)
Total Bilirubin: 0.9 mg/dL (ref 0.3–1.2)
Total Protein: 6.4 g/dL — ABNORMAL LOW (ref 6.5–8.1)

## 2021-06-12 LAB — LIPASE, BLOOD: Lipase: 36 U/L (ref 11–51)

## 2021-06-12 LAB — URINALYSIS, ROUTINE W REFLEX MICROSCOPIC
Glucose, UA: NEGATIVE mg/dL
Ketones, ur: NEGATIVE mg/dL
Leukocytes,Ua: NEGATIVE
Nitrite: NEGATIVE
Protein, ur: 30 mg/dL — AB
Specific Gravity, Urine: 1.026 (ref 1.005–1.030)
pH: 6 (ref 5.0–8.0)

## 2021-06-12 LAB — TROPONIN I (HIGH SENSITIVITY)
Troponin I (High Sensitivity): 3 ng/L (ref <18)
Troponin I (High Sensitivity): 3 ng/L (ref <18)

## 2021-06-12 MED ORDER — LACTATED RINGERS IV BOLUS
1000.0000 mL | Freq: Once | INTRAVENOUS | Status: AC
  Administered 2021-06-12: 1000 mL via INTRAVENOUS

## 2021-06-12 MED ORDER — DICYCLOMINE HCL 10 MG PO CAPS
20.0000 mg | ORAL_CAPSULE | Freq: Once | ORAL | Status: AC
  Administered 2021-06-12: 20 mg via ORAL
  Filled 2021-06-12: qty 2

## 2021-06-12 MED ORDER — ALUM & MAG HYDROXIDE-SIMETH 200-200-20 MG/5ML PO SUSP
30.0000 mL | Freq: Once | ORAL | Status: AC
  Administered 2021-06-12: 30 mL via ORAL
  Filled 2021-06-12: qty 30

## 2021-06-12 MED ORDER — LIDOCAINE VISCOUS HCL 2 % MT SOLN
15.0000 mL | Freq: Once | OROMUCOSAL | Status: AC
  Administered 2021-06-12: 15 mL via ORAL
  Filled 2021-06-12: qty 15

## 2021-06-12 MED ORDER — METOCLOPRAMIDE HCL 10 MG PO TABS: 10.0000 mg | ORAL_TABLET | Freq: Four times a day (QID) | ORAL | 0 refills | Status: DC

## 2021-06-12 MED ORDER — FAMOTIDINE IN NACL 20-0.9 MG/50ML-% IV SOLN
20.0000 mg | Freq: Once | INTRAVENOUS | Status: AC
  Administered 2021-06-12: 20 mg via INTRAVENOUS
  Filled 2021-06-12: qty 50

## 2021-06-12 MED ORDER — DICYCLOMINE HCL 20 MG PO TABS: 20.0000 mg | ORAL_TABLET | Freq: Two times a day (BID) | ORAL | 0 refills | Status: DC

## 2021-06-12 NOTE — Progress Notes (Signed)
Per pt your mother has a clotting disorder of blood.  Pt reports that she was tested and did not carry that gene.  Maw    Patient's pre-visit was done today over the phone with the patient due to COVID-19 pandemic. Name,DOB and address verified. Insurance verified. Patient denies any allergies to Soy. Patient said she not sure if allergy to eggs, but her allergist advised her to "stay off eggs, to see if helps stomach issues".   Patient denies any problems with anesthesia/sedation. Patient denies taking diet pills or blood thinners. No home Oxygen. Packet of Prep instructions mailed to patient including a copy of a consent form-pt is aware. Patient understands to call us back with any questions or concerns. Patient is aware of our care-partner policy and 0000000 safety protocol.    The patient is COVID-19 vaccinated.

## 2021-06-12 NOTE — Discharge Instructions (Addendum)
Avoid diary, eggs, potatoes, chocolate and caffeine as suggested by Dr. Neldon Mc.  Your work-up today was quite reassuring.  Please follow-up with both your gastroenterologist as well as your allergist.  I have prescribed you Reglan to use as needed for nausea.  I have also prescribed you Bentyl which I would like you to take as prescribed.  I would like you to continue using your Nexium.  Follow the dietary measures discussed above.  It appears that your gastroenterologist has plans to do an upper endoscopy next month.  Please make sure that you keep this appointment and follow the directions.  Notably you do have a very small amount of blood in her urine.  You should have this rechecked by your primary care provider if you continue to have blood in your urine you may need additional work-up for this.

## 2021-06-12 NOTE — Telephone Encounter (Signed)
Called patient to discuss Sherri Ruiz for EOE. Her Ins has denied her due to needing documentation of EOS/HPF. She advised she could not discuss and would need to call me back.

## 2021-06-12 NOTE — ED Provider Notes (Signed)
[facility] EMERGENCY DEPARTMENT Provider Note   CSN: [id number] Arrival date & time: 06/12/21  [id number]     History Chief Complaint  Patient presents with   Chest Pain   Abdominal Pain    Sherri Ruiz is a 60 y.o. female.  HPI Patient is a 60 year old female with past medical history significant for IBS, anxiety, eosinophilic esophagitis, nutcracker esophagus, HTN, HLD  Patient deals with nearly daily reflux-like symptoms on a nearly daily basis.  She states that for the past 4 days she has been taking her normal medications including GI cocktails, twice daily Nexium and simethicone without improvement.  She states that it is a burning epigastric abdominal pain that seems to also affect her central chest she states that she feels that she has gas but has not had any relief of burping or farting.  She is also not had any improvement with simethicone.  She states that she has had no fevers or chills.  She has not had any diarrhea or melena hematochezia.  She states that she tested positive for COVID-19 7/23 and is curious if this is related.  She denies any radiation of chest pain to her back or neck or arm.  She denies any nausea vomiting or shortness of breath no lightheadedness or dizziness.  She states that she has restarted the nondairy nonegg diet that her gastroenterologist recommended for her.     Past Medical History:  Diagnosis Date   Allergic rhinitis    Allergy    Anxiety    OCC    Asthma    none since on Singulair started    Cataract    removed right eye, left forming    Congenital clotting factor deficiency (HCC)    Eosinophilic esophagitis    GERD (gastroesophageal reflux disease)    High blood pressure    High cholesterol    History of hepatitis A    Hypothyroidism    IBS (irritable bowel syndrome)    IDA (iron deficiency anemia)    LPRD (laryngopharyngeal reflux disease)    Penicillin allergy    Peripheral neuropathy    PONV (postoperative  nausea and vomiting)    Due to Demerol    Vitamin B12 deficiency anemia     Patient Active Problem List   Diagnosis Date Noted   Chest pain at rest 07/20/2018   Essential hypertension 07/20/2018   Hyperlipidemia 07/20/2018   Hypothyroidism 07/20/2018   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 Mild gastrtitis. Gastric polyps.    ESOPHAGOGASTRODUODENOSCOPY  12/25/2017   OTHER SURGICAL HISTORY     Hysterectomy   TOTAL ABDOMINAL HYSTERECTOMY     UPPER GASTROINTESTINAL ENDOSCOPY       OB History   No obstetric history on file.     Family History  Problem Relation Age of Onset   Clotting disorder Mother    High Cholesterol Mother    Asthma Mother    Stroke Mother    High blood pressure Father    Heart attack Father 77       [place].  Atrial fib.     Colon polyps Father    Cancer Sister    Asthma Daughter    Heart disease Brother  Pacemaker   Stomach cancer Maternal Aunt    Esophageal cancer Other    Colon cancer Neg Hx    Rectal cancer Neg Hx     Social History   Tobacco Use   Smoking status: Former    Types: Cigarettes    Quit date: 1989    Years since quitting: 33.6   Smokeless tobacco: Never  Vaping Use   Vaping Use: Never used  Substance Use Topics   Alcohol use: Not Currently   Drug use: Never    Home Medications Prior to Admission medications   Medication Sig Start Date End Date Taking? Authorizing Provider  dicyclomine (BENTYL) 20 MG tablet Take 1 tablet (20 mg total) by mouth 2 (two) times daily. 06/12/21  Yes Doreatha Offer S, PA  metoCLOPramide (REGLAN) 10 MG tablet Take 1 tablet (10 mg total) by mouth every 6 (six) hours. 06/12/21  Yes Abbagale Goguen S, PA  albuterol (VENTOLIN HFA) 108 (90 Base) MCG/ACT inhaler Inhale 2  puffs into the lungs 4 (four) times daily. Patient not taking: Reported on 06/12/2021 06/02/21   [provider]  ALPRAZolam Duanne Moron) 0.25 MG tablet Take 0.25 mg by mouth as needed for anxiety.  05/01/18   [provider]  AMBULATORY NON FORMULARY MEDICATION Medication Name: GI Cocktail (Equal amounts of Maalox, Viscous Lidocaine, Bentyl)  Take 10 cc by mouth every 8 hours as needed. 03/11/21   Jackquline Denmark, MD  atorvastatin (LIPITOR) 20 MG tablet Take 20 mg by mouth at bedtime.  12/15/17   [provider]  Cetirizine HCl (ZYRTEC PO) Take 1 tablet by mouth daily.     [provider]  esomeprazole (NEXIUM) 40 MG capsule Take 1 capsule (40 mg total) by mouth 2 (two) times daily. 06/10/21   Jackquline Denmark, MD  levothyroxine (SYNTHROID, LEVOTHROID) 50 MCG tablet Take 50 mcg by mouth daily before breakfast.  11/21/16   [provider]  lisinopril (PRINIVIL,ZESTRIL) 5 MG tablet Take 5 mg by mouth daily.  10/16/16   [provider]  MINIVELLE 0.1 MG/24HR patch Place 1 patch onto the skin once a week. 11/04/16   [provider]  montelukast (SINGULAIR) 10 MG tablet TAKE 1 TABLET BY MOUTH EVERY DAY AS DIRECTED 05/28/21   Kozlow, Donnamarie Poag, MD  Multiple Vitamins-Minerals (HAIR SKIN AND NAILS FORMULA PO) Take 1 tablet by mouth daily. Patient not taking: Reported on 06/12/2021    [provider]  Multiple Vitamins-Minerals (MULTIVITAMIN PO) Take by mouth daily.    [provider]  NEXIUM 40 MG capsule Take 1 capsule (40 mg total) by mouth daily. Patient not taking: Reported on 06/12/2021 03/08/19   Jackquline Denmark, MD  Probiotic Product (DIGESTIVE ADVANTAGE PO) Take by mouth daily.    [provider]  promethazine (PHENERGAN) 25 MG tablet Take 25 mg by mouth as needed. 12/19/19   [provider]  Simethicone (GAS-X PO) Take 1 tablet by mouth 3 (three) times daily as needed.    [provider]  sucralfate (CARAFATE) 1  GM/10ML suspension SMARTSIG:Milliliter(s) By Mouth Patient not taking: Reported on 06/12/2021 06/10/21   [provider]    Allergies    Clindamycin/lincomycin, Meperidine, and Penicillin g  Review of Systems   Review of Systems  Constitutional:  Negative for chills and fever.  HENT:  Negative for congestion.   Eyes:  Negative for pain.  Respiratory:  Negative for cough and shortness of breath.   Cardiovascular:  Positive for chest pain. Negative for leg  swelling.  Gastrointestinal:  Positive for abdominal pain. Negative for diarrhea, nausea and vomiting.  Genitourinary:  Negative for dysuria.  Musculoskeletal:  Negative for myalgias.  Skin:  Negative for rash.  Neurological:  Negative for dizziness and headaches.   Physical Exam Updated Vital Signs BP (!) 170/91   Pulse 61   Temp (!) 97.2 F (36.2 C) (Oral)   Resp 19   Ht '5\' 2"'$  (1.575 m)   Wt 57.6 kg   LMP 07/20/2018   SpO2 99%   BMI 23.23 kg/m   Physical Exam Vitals and nursing note reviewed.  Constitutional:      General: She is not in acute distress.    Comments: Pleasant well-appearing 60 year old.  In no acute distress.  Sitting comfortably in bed.  Able answer questions appropriately follow commands. No increased work of breathing. Speaking in full sentences.  HENT:     Head: Normocephalic and atraumatic.     Nose: Nose normal.     Mouth/Throat:     Mouth: Mucous membranes are dry.  Eyes:     General: No scleral icterus. Cardiovascular:     Rate and Rhythm: Normal rate and regular rhythm.     Pulses: Normal pulses.     Heart sounds: Normal heart sounds.  Pulmonary:     Effort: Pulmonary effort is normal. No respiratory distress.     Breath sounds: Normal breath sounds. No wheezing.  Abdominal:     Palpations: Abdomen is soft.     Tenderness: There is no abdominal tenderness. There is no guarding or rebound.  Musculoskeletal:     Cervical back: Normal range of motion.     Right lower leg: No  edema.     Left lower leg: No edema.  Skin:    General: Skin is warm and dry.     Capillary Refill: Capillary refill takes less than 2 seconds.  Neurological:     Mental Status: She is alert. Mental status is at baseline.  Psychiatric:        Mood and Affect: Mood normal.        Behavior: Behavior normal.    ED Results / Procedures / Treatments   Labs (all labs ordered are listed, but only abnormal results are displayed) Labs Reviewed  COMPREHENSIVE METABOLIC PANEL - Abnormal; Notable for the following components:      Result Value   Potassium 3.4 (*)    Glucose, Bld 105 (*)    Total Protein 6.4 (*)    Alkaline Phosphatase 37 (*)    All other components within normal limits  URINALYSIS, ROUTINE W REFLEX MICROSCOPIC - Abnormal; Notable for the following components:   Color, Urine AMBER (*)    APPearance HAZY (*)    Hgb urine dipstick SMALL (*)    Bilirubin Urine SMALL (*)    Protein, ur 30 (*)    Bacteria, UA RARE (*)    All other components within normal limits  CBC  LIPASE, BLOOD  TROPONIN I (HIGH SENSITIVITY)  TROPONIN I (HIGH SENSITIVITY)    EKG EKG Interpretation  Date/Time:  Wednesday June 12 2021 07:19:39 EDT Ventricular Rate:  74 PR Interval:  152 QRS Duration: 62 QT Interval:  376 QTC Calculation: 417 R Axis:   35 Text Interpretation: Normal sinus rhythm with sinus arrhythmia Right atrial enlargement Low voltage QRS Cannot rule out Anterior infarct , age undetermined No significant change since last tracing Confirmed by Blanchie Dessert [phone]) on 06/12/2021 1:52:40 PM  Radiology DG Chest 2  View  Result Date: 06/12/2021 CLINICAL DATA:  Chest pain. EXAM: CHEST - 2 VIEW COMPARISON:  07/20/2018. FINDINGS: Cardiac silhouette is within normal limits. The lungs are well inflated. No focal consolidation or mass. No pleural effusion or pneumothorax. Cholecystectomy clips.  No acute osseous abnormality. IMPRESSION: Normal chest Electronically Signed   By: Michaelle Birks M.D.   On: 06/12/2021 07:55    Procedures Procedures   Medications Ordered in ED Medications  alum & mag hydroxide-simeth (MAALOX/MYLANTA) 200-200-20 MG/5ML suspension 30 mL (30 mLs Oral Given 06/12/21 1356)    And  lidocaine (XYLOCAINE) 2 % viscous mouth solution 15 mL (15 mLs Oral Given 06/12/21 1357)  famotidine (PEPCID) IVPB 20 mg premix (0 mg Intravenous Stopped 06/12/21 1441)  lactated ringers bolus 1,000 mL (0 mLs Intravenous Stopped 06/12/21 1448)  dicyclomine (BENTYL) capsule 20 mg (20 mg Oral Given 06/12/21 1355)    ED Course  I have reviewed the triage vital signs and the nursing notes.  Pertinent labs & imaging results that were available during my care of the patient were reviewed by me and considered in my medical decision making (see chart for details).  Clinical Course as of 06/12/21 1833  Wed Jun 12, 2021  1421 Hgb urine dipstick(!): SMALL [WF]    Clinical Course User Index [WF] Tedd Sias, [place]   MDM Rules/Calculators/A&P                           Patient is 60 year old female with history of nutcracker esophagus, esophagus esophagitis, GI issues she is followed by gastroenterology for these.  She has a multimodal treatment plan.  Is on multiple medications.  Given 1 GI cocktail, Bentyl, 1 L of LR, Pepcid and Bentyl and reevaluated.  Her symptoms have now significantly improved.  They are very atypical chest pain concerning more for reflux or esophagitis or perhaps some element of anxiety  CMP unremarkable from an ER standpoint.  CBC without leukocytosis or anemia.  Troponin x2 within normal limits.  Lipase within normal limits.  Urinalysis not concerning for urinary tract infection as she has had no symptoms of UTI.  Chest x-ray unremarkable.  EKG nonischemic.  Small amount of hematuria on UA.  We will follow-up with PCP to recheck this.  Given some significant improvement in symptoms we will discharge patient home with Bentyl and Reglan.  Return  precautions given.  Patient p.o. challenge successfully.  Final Clinical Impression(s) / ED Diagnoses Final diagnoses:  Atypical chest pain  Epigastric abdominal pain    Rx / DC Orders ED Discharge Orders          Ordered    dicyclomine (BENTYL) 20 MG tablet  2 times daily        06/12/21 1426    metoCLOPramide (REGLAN) 10 MG tablet  Every 6 hours        06/12/21 1426             Pati Gallo [place], [place] 06/12/21 1834    Blanchie Dessert, MD 06/15/21 [phone]

## 2021-06-12 NOTE — ED Triage Notes (Addendum)
Pt reports epigastric pain since Sunday, tried several OTC GI medications without relief thinking it was gas. Denies n/v, sob or dizziness. Pt tested positive for COVID on 7/23

## 2021-06-13 DIAGNOSIS — F411 Generalized anxiety disorder: Secondary | ICD-10-CM | POA: Diagnosis not present

## 2021-06-17 NOTE — Telephone Encounter (Signed)
That is it so far Lets wait for EGD RG

## 2021-06-18 DIAGNOSIS — F411 Generalized anxiety disorder: Secondary | ICD-10-CM | POA: Diagnosis not present

## 2021-06-18 NOTE — Telephone Encounter (Signed)
Patient husband faxed denial asking what could be done. I have located the report for EOS and faxed note to husband need patient to sign rep appointment so I can appeal same

## 2021-06-25 DIAGNOSIS — F411 Generalized anxiety disorder: Secondary | ICD-10-CM | POA: Diagnosis not present

## 2021-06-26 ENCOUNTER — Other Ambulatory Visit: Payer: Self-pay | Admitting: Allergy and Immunology

## 2021-06-27 ENCOUNTER — Ambulatory Visit (AMBULATORY_SURGERY_CENTER): Payer: Federal, State, Local not specified - PPO | Admitting: Gastroenterology

## 2021-06-27 ENCOUNTER — Other Ambulatory Visit: Payer: Self-pay

## 2021-06-27 ENCOUNTER — Encounter: Payer: Self-pay | Admitting: Gastroenterology

## 2021-06-27 ENCOUNTER — Observation Stay (HOSPITAL_COMMUNITY)
Admission: EM | Admit: 2021-06-27 | Discharge: 2021-06-30 | Disposition: A | Discharge status: Home / Self Care | Payer: Federal, State, Local not specified - PPO | Attending: Internal Medicine | Admitting: Internal Medicine

## 2021-06-27 ENCOUNTER — Emergency Department (HOSPITAL_COMMUNITY): Payer: Federal, State, Local not specified - PPO

## 2021-06-27 ENCOUNTER — Encounter (HOSPITAL_COMMUNITY): Payer: Self-pay | Admitting: Emergency Medicine

## 2021-06-27 VITALS — BP 159/84 | HR 57 | Temp 97.8°F | Resp 10 | Ht 63.0 in | Wt 127.0 lb

## 2021-06-27 DIAGNOSIS — K317 Polyp of stomach and duodenum: Secondary | ICD-10-CM

## 2021-06-27 DIAGNOSIS — J45909 Unspecified asthma, uncomplicated: Secondary | ICD-10-CM | POA: Diagnosis not present

## 2021-06-27 DIAGNOSIS — Z79899 Other long term (current) drug therapy: Secondary | ICD-10-CM | POA: Insufficient documentation

## 2021-06-27 DIAGNOSIS — R112 Nausea with vomiting, unspecified: Secondary | ICD-10-CM

## 2021-06-27 DIAGNOSIS — K2970 Gastritis, unspecified, without bleeding: Secondary | ICD-10-CM

## 2021-06-27 DIAGNOSIS — K219 Gastro-esophageal reflux disease without esophagitis: Secondary | ICD-10-CM

## 2021-06-27 DIAGNOSIS — K319 Disease of stomach and duodenum, unspecified: Secondary | ICD-10-CM | POA: Diagnosis not present

## 2021-06-27 DIAGNOSIS — I10 Essential (primary) hypertension: Secondary | ICD-10-CM

## 2021-06-27 DIAGNOSIS — J309 Allergic rhinitis, unspecified: Secondary | ICD-10-CM

## 2021-06-27 DIAGNOSIS — E039 Hypothyroidism, unspecified: Secondary | ICD-10-CM

## 2021-06-27 DIAGNOSIS — Z87891 Personal history of nicotine dependence: Secondary | ICD-10-CM | POA: Diagnosis not present

## 2021-06-27 DIAGNOSIS — R1110 Vomiting, unspecified: Secondary | ICD-10-CM | POA: Diagnosis not present

## 2021-06-27 DIAGNOSIS — K922 Gastrointestinal hemorrhage, unspecified: Principal | ICD-10-CM

## 2021-06-27 DIAGNOSIS — K200 Eosinophilic esophagitis: Secondary | ICD-10-CM | POA: Diagnosis not present

## 2021-06-27 DIAGNOSIS — R1310 Dysphagia, unspecified: Secondary | ICD-10-CM

## 2021-06-27 DIAGNOSIS — J4520 Mild intermittent asthma, uncomplicated: Secondary | ICD-10-CM | POA: Insufficient documentation

## 2021-06-27 DIAGNOSIS — Z8719 Personal history of other diseases of the digestive system: Secondary | ICD-10-CM | POA: Diagnosis not present

## 2021-06-27 DIAGNOSIS — Z20822 Contact with and (suspected) exposure to covid-19: Secondary | ICD-10-CM | POA: Diagnosis not present

## 2021-06-27 DIAGNOSIS — E785 Hyperlipidemia, unspecified: Secondary | ICD-10-CM | POA: Diagnosis present

## 2021-06-27 DIAGNOSIS — E7849 Other hyperlipidemia: Secondary | ICD-10-CM

## 2021-06-27 DIAGNOSIS — E876 Hypokalemia: Secondary | ICD-10-CM | POA: Diagnosis not present

## 2021-06-27 DIAGNOSIS — D72829 Elevated white blood cell count, unspecified: Secondary | ICD-10-CM

## 2021-06-27 DIAGNOSIS — D62 Acute posthemorrhagic anemia: Secondary | ICD-10-CM | POA: Diagnosis not present

## 2021-06-27 HISTORY — PX: ESOPHAGOGASTRODUODENOSCOPY: SHX1529

## 2021-06-27 LAB — COMPREHENSIVE METABOLIC PANEL
ALT: 20 U/L (ref 0–44)
AST: 20 U/L (ref 15–41)
Albumin: 3.8 g/dL (ref 3.5–5.0)
Alkaline Phosphatase: 39 U/L (ref 38–126)
Anion gap: 8 (ref 5–15)
BUN: 30 mg/dL — ABNORMAL HIGH (ref 6–20)
CO2: 26 mmol/L (ref 22–32)
Calcium: 8.9 mg/dL (ref 8.9–10.3)
Chloride: 107 mmol/L (ref 98–111)
Creatinine, Ser: 0.85 mg/dL (ref 0.44–1.00)
GFR, Estimated: >60 mL/min (ref >60)
Glucose, Bld: 141 mg/dL — ABNORMAL HIGH (ref 70–99)
Potassium: 3.2 mmol/L — ABNORMAL LOW (ref 3.5–5.1)
Sodium: 141 mmol/L (ref 135–145)
Total Bilirubin: 1 mg/dL (ref 0.3–1.2)
Total Protein: 6.5 g/dL (ref 6.5–8.1)

## 2021-06-27 LAB — HEMOGLOBIN AND HEMATOCRIT, BLOOD
HCT: 33 % — ABNORMAL LOW (ref 36.0–46.0)
Hemoglobin: 11.2 g/dL — ABNORMAL LOW (ref 12.0–15.0)

## 2021-06-27 LAB — MAGNESIUM: Magnesium: 1.8 mg/dL (ref 1.7–2.4)

## 2021-06-27 LAB — CBC
HCT: 38.7 % (ref 36.0–46.0)
Hemoglobin: 13 g/dL (ref 12.0–15.0)
MCH: 31.2 pg (ref 26.0–34.0)
MCHC: 33.6 g/dL (ref 30.0–36.0)
MCV: 92.8 fL (ref 80.0–100.0)
Platelets: 258 10*3/uL (ref 150–400)
RBC: 4.17 MIL/uL (ref 3.87–5.11)
RDW: 13 % (ref 11.5–15.5)
WBC: 11.5 10*3/uL — ABNORMAL HIGH (ref 4.0–10.5)
nRBC: 0 % (ref 0.0–0.2)

## 2021-06-27 LAB — APTT: aPTT: 27 seconds (ref 24–36)

## 2021-06-27 LAB — ABO/RH: ABO/RH(D): O POS

## 2021-06-27 LAB — PROTIME-INR
INR: 1 (ref 0.8–1.2)
Prothrombin Time: 13.6 seconds (ref 11.4–15.2)

## 2021-06-27 LAB — RESP PANEL BY RT-PCR (FLU A&B, COVID) ARPGX2
Influenza A by PCR: NEGATIVE
Influenza B by PCR: NEGATIVE
SARS Coronavirus 2 by RT PCR: NEGATIVE

## 2021-06-27 LAB — PREPARE RBC (CROSSMATCH)

## 2021-06-27 MED ORDER — PANTOPRAZOLE SODIUM 40 MG IV SOLR: 40.0000 mg | Freq: Two times a day (BID) | INTRAVENOUS | Status: DC

## 2021-06-27 MED ORDER — PANTOPRAZOLE SODIUM 40 MG IV SOLR
40.0000 mg | Freq: Once | INTRAVENOUS | Status: DC
Start: 2021-06-27 — End: 2021-06-27
  Administered 2021-06-27: 40 mg via INTRAVENOUS
  Filled 2021-06-27: qty 40

## 2021-06-27 MED ORDER — SODIUM CHLORIDE 0.9 % IV BOLUS (SEPSIS)
500.0000 mL | Freq: Once | INTRAVENOUS | Status: AC
  Administered 2021-06-27: 500 mL via INTRAVENOUS

## 2021-06-27 MED ORDER — SODIUM CHLORIDE 0.9% IV SOLUTION: Freq: Once | INTRAVENOUS | Status: DC

## 2021-06-27 MED ORDER — PANTOPRAZOLE INFUSION (NEW) - SIMPLE MED
8.0000 mg/h | INTRAVENOUS | Status: DC
  Administered 2021-06-27 – 2021-06-29 (×4): 8 mg/h via INTRAVENOUS
  Filled 2021-06-27 (×2): qty 80
  Filled 2021-06-27: qty 100
  Filled 2021-06-27 (×2): qty 80
  Filled 2021-06-27: qty 100

## 2021-06-27 MED ORDER — SODIUM CHLORIDE 0.9 % IV SOLN
1000.0000 mL | INTRAVENOUS | Status: DC
Start: 2021-06-27 — End: 2021-06-29
  Administered 2021-06-27 – 2021-06-29 (×4): 1000 mL via INTRAVENOUS

## 2021-06-27 MED ORDER — PROMETHAZINE HCL 25 MG/ML IJ SOLN
25.0000 mg | Freq: Four times a day (QID) | INTRAMUSCULAR | Status: DC | PRN
Start: 2021-06-27 — End: 2021-06-27
  Filled 2021-06-27: qty 1

## 2021-06-27 MED ORDER — SODIUM CHLORIDE 0.9 % IV SOLN
12.5000 mg | Freq: Four times a day (QID) | INTRAVENOUS | Status: DC | PRN
  Administered 2021-06-28: 12.5 mg via INTRAVENOUS
  Filled 2021-06-27: qty 12.5

## 2021-06-27 MED ORDER — PANTOPRAZOLE 80MG IVPB - SIMPLE MED
80.0000 mg | Freq: Once | INTRAVENOUS | Status: AC
  Administered 2021-06-27: 80 mg via INTRAVENOUS
  Filled 2021-06-27: qty 80

## 2021-06-27 MED ORDER — ACETAMINOPHEN 650 MG RE SUPP: 650.0000 mg | Freq: Four times a day (QID) | RECTAL | Status: DC | PRN

## 2021-06-27 MED ORDER — ONDANSETRON HCL 4 MG/2ML IJ SOLN
4.0000 mg | Freq: Once | INTRAMUSCULAR | Status: AC
  Administered 2021-06-27: 4 mg via INTRAVENOUS
  Filled 2021-06-27: qty 2

## 2021-06-27 MED ORDER — ONDANSETRON HCL 4 MG/2ML IJ SOLN
4.0000 mg | Freq: Four times a day (QID) | INTRAMUSCULAR | Status: DC | PRN
  Administered 2021-06-28 – 2021-06-29 (×2): 4 mg via INTRAVENOUS
  Filled 2021-06-27 (×2): qty 2

## 2021-06-27 MED ORDER — LORAZEPAM 2 MG/ML IJ SOLN
0.5000 mg | Freq: Once | INTRAMUSCULAR | Status: AC
  Administered 2021-06-27: 0.5 mg via INTRAVENOUS
  Filled 2021-06-27: qty 1

## 2021-06-27 MED ORDER — GI COCKTAIL ~~LOC~~
30.0000 mL | Freq: Two times a day (BID) | ORAL | 0 refills | Status: DC
Start: 2021-06-27 — End: 2022-11-12

## 2021-06-27 MED ORDER — STERILE WATER FOR INJECTION IJ SOLN
INTRAMUSCULAR | Status: AC
  Filled 2021-06-27: qty 10

## 2021-06-27 MED ORDER — LORAZEPAM 2 MG/ML IJ SOLN: 0.5000 mg | Freq: Once | INTRAMUSCULAR | Status: DC

## 2021-06-27 MED ORDER — POTASSIUM CHLORIDE 10 MEQ/100ML IV SOLN
10.0000 meq | INTRAVENOUS | Status: AC
Start: 2021-06-27 — End: 2021-06-28
  Administered 2021-06-27 – 2021-06-28 (×3): 10 meq via INTRAVENOUS
  Filled 2021-06-27 (×3): qty 100

## 2021-06-27 MED ORDER — SODIUM CHLORIDE 0.9 % IV SOLN: 500.0000 mL | Freq: Once | INTRAVENOUS | Status: DC

## 2021-06-27 MED ORDER — LORAZEPAM 2 MG/ML IJ SOLN: 0.5000 mg | INTRAMUSCULAR | Status: DC | PRN

## 2021-06-27 MED ORDER — ACETAMINOPHEN 325 MG PO TABS: 650.0000 mg | ORAL_TABLET | Freq: Four times a day (QID) | ORAL | Status: DC | PRN

## 2021-06-27 NOTE — Progress Notes (Signed)
Patient states abdominal discomfort and nausea has subsided.  Per MD Lyndel Safe patient is cleared for discharge.

## 2021-06-27 NOTE — ED Triage Notes (Signed)
Patient presents with hematemesis that began approx 2.5 hours ago. Patient had her esophagus stretched this morning. Patient is having black tarry stools. Patient noted to have +BRB in her emesis.

## 2021-06-27 NOTE — H&P (Signed)
History and Physical    PLEASE NOTE THAT DRAGON DICTATION SOFTWARE WAS USED IN THE CONSTRUCTION OF THIS NOTE.   Sherri Ruiz STM:[id number] DOB: 03-12-1961 DOA: 06/27/2021  PCP: Nicoletta Dress, MD Patient coming from: home   I have personally briefly reviewed patient's old medical records in [place]  Chief Complaint: Hematemesis  HPI: Sherri Ruiz is a 60 y.o. female with medical history significant for GERD, eosinophilic esophagitis, hypertension, who is admitted to [facility] on 06/27/2021 with acute upper GI bleed after presenting from home to [facility] ED complaining of hematemesis.   In the setting of dysphagia and a history of eosinophilic esophagitis, the patient underwent outpatient EGD earlier today the Dr. Lyndel Safe of GI.  Per chart review, EGD showed a few circular rings associated with the proximal and distal esophagus, but in the absence of any associated strictures.  In the context of the patient's report of dysphagia, dilation of the strings was performed, and biopsies from the proximal and distal esophagus were taken and sent for histologic evaluation.  Additionally, today's EGD showed localized mild inflammation associated with erythema in the gastric antrum without evidence of active bleed.  Biopsies of the gastric antrum were also taken and sent for histology.  Also, EGD showed 20-25 sessile polyps in the gastric body, which were removed with a cold snare.  Duodenum reportedly appeared normal.  No reported intraoperative complications, no evidence of active bleed.   The patient reports that while in recovery following today's EGD, that she experienced mild nausea in the absence of any vomiting.  She notes improvement in the nausea following dose of Zofran, after which she was discharged home from this outpatient procedure.  She was home few hours before developing several episodes of loose, dark appearing stool in the absence of bright red blood.  Subsequently, she  developed additional nausea, and experienced 3-4 episodes of hematemesis, noting that her emesis appeared to be associated with "red coloration" in the absence of coffee-ground appearance.  After experiencing these 3-4 episodes of hematemesis at home, she presented to [facility] emergency department for further evaluation of the above.  Over the course of her time in the ED, she experienced 3 more episodes of similar hematemesis.  Denies any associated abdominal discomfort.  She notes mild dizziness over the last few hours, in the absence of any associated presyncope or syncope.  Not associate with any chest pain, shortness of breath, palpitations, or diaphoresis.  She also denies any associated subjective fever, chills, rigors, or generalized myalgias.  No recent trauma.  Denies any associated dysuria, gross hematuria, or change in urinary urgency/frequency.  Denies any known history of underlying liver disease.  No regular or recent alcohol consumption.  Not on a blood thinners at home, including aspirin.  Denies any use of NSAIDs, and also denies any known history of prior gastrointestinal bleed.      ED Course:  Vital signs in the ED were notable for the following: Afebrile; initial heart rate 109, which decreased to 87-99 following interval IV fluids, as detailed below; blood pressure 128/85 -146/95; respiratory rate 17-20, oxygen saturation 98 to 99% on room air.  Labs were notable for the following: CMP was notable for the following: Potassium 3.2, bicarbonate 26, BUN 30 compared to most recent prior BUN value of 8 on 06/12/2021, creatinine 0.85, glucose 141, and liver enzymes were within normal limits.  CBC notable for white cell count 11,500, hemoglobin 13.0 compared to 15 on 06/12/2021.  INR  1.0.  Repeat H&H performed approximately 2 hours after initial CBC noted hemoglobin to be 11.2.  Screening COVID-19 PCR performed in the ED today was found to be negative.  Imaging and additional notable ED  work-up: EKG shows sinus rhythm with heart rate 92, normal intervals, and no evidence of T wave or ST changes, including no evidence of ST elevation.  Chest x-ray showed no evidence of acute cardiopulmonary process.  EDP discussed the patient's case with the on-call gastroenterologist, Dr. Lyndel Safe, who also performed the EGD earlier today.  Dr. Lyndel Safe to come to the hospital this evening to further evaluate the patient.   While in the ED, the following were administered: Protonix 40 mg IV x1 followed by initiation of Protonix drip.  Normal saline x500 cc bolus.  Zofran 4 mg IV x2.  Subsequently, the patient was admitted to the stepdown unit for further evaluation and management of acute upper GI bleed associated with acute blood loss anemia.    Review of Systems: As per HPI otherwise 10 point review of systems negative.   Past Medical History:  Diagnosis Date   Allergic rhinitis    Allergy    Anxiety    OCC    Asthma    none since on Singulair started    Cataract    removed right eye, left forming    Congenital clotting factor deficiency (HCC)    Eosinophilic esophagitis    GERD (gastroesophageal reflux disease)    High blood pressure    High cholesterol    History of hepatitis A    Hypothyroidism    IBS (irritable bowel syndrome)    IDA (iron deficiency anemia)    LPRD (laryngopharyngeal reflux disease)    Penicillin allergy    Peripheral neuropathy    PONV (postoperative nausea and vomiting)    Due to Demerol    Vitamin B12 deficiency anemia     Past Surgical History:  Procedure Laterality Date   CATARACT EXTRACTION  03/2020   CHOLECYSTECTOMY     COLONOSCOPY     ESOPHAGOGASTRODUODENOSCOPY  06/16/2014   Wide open cervial esophageal web. Mild gastrtitis. Gastric polyps.    ESOPHAGOGASTRODUODENOSCOPY  12/25/2017   OTHER SURGICAL HISTORY     Hysterectomy   TOTAL ABDOMINAL HYSTERECTOMY     UPPER GASTROINTESTINAL ENDOSCOPY      Social History:  reports that she quit  smoking about 33 years ago. Her smoking use included cigarettes. She has never used smokeless tobacco. She reports that she does not currently use alcohol. She reports that she does not use drugs.   Allergies  Allergen Reactions   Clindamycin/Lincomycin Swelling   Meperidine Nausea And Vomiting    unknown   Penicillin G Other (See Comments)    Unknown/childhood allergy Has patient had a PCN reaction causing immediate rash, facial/tongue/throat swelling, SOB or lightheadedness with hypotension: Yes Has patient had a PCN reaction causing severe rash involving mucus membranes or skin necrosis: Unknown Has patient had a PCN reaction that required hospitalization: No Has patient had a PCN reaction occurring within the last 10 years: Unknown If all of the above answers are "NO", then may proceed with Cephalosporin use.      Family History  Problem Relation Age of Onset   Clotting disorder Mother    High Cholesterol Mother    Asthma Mother    Stroke Mother    High blood pressure Father    Heart attack Father 61       [place].  Atrial fib.     Colon polyps Father    Cancer Sister    Asthma Daughter    Heart disease Brother        Pacemaker   Stomach cancer Maternal Aunt    Esophageal cancer Other    Colon cancer Neg Hx    Rectal cancer Neg Hx     Family history reviewed and not pertinent    Prior to Admission medications   Medication Sig Start Date End Date Taking? Authorizing Provider  albuterol (VENTOLIN HFA) 108 (90 Base) MCG/ACT inhaler Inhale 2 puffs into the lungs 4 (four) times daily. Patient not taking: No sig reported 06/02/21   [provider]  ALPRAZolam Duanne Moron) 0.25 MG tablet Take 0.25 mg by mouth as needed for anxiety.  05/01/18   [provider]  Alum & Mag Hydroxide-Simeth (GI COCKTAIL) SUSP suspension Take 30 mLs by mouth 2 (two) times daily. Shake well. 06/27/21   Jackquline Denmark, MD  AMBULATORY NON FORMULARY MEDICATION Medication Name: GI  Cocktail (Equal amounts of Maalox, Viscous Lidocaine, Bentyl)  Take 10 cc by mouth every 8 hours as needed. 03/11/21   Jackquline Denmark, MD  atorvastatin (LIPITOR) 20 MG tablet Take 20 mg by mouth at bedtime.  12/15/17   [provider]  Cetirizine HCl (ZYRTEC PO) Take 1 tablet by mouth daily.     [provider]  dicyclomine (BENTYL) 20 MG tablet Take 1 tablet (20 mg total) by mouth 2 (two) times daily. 06/12/21   Tedd Sias, PA  esomeprazole (NEXIUM) 40 MG capsule Take 1 capsule (40 mg total) by mouth 2 (two) times daily. 06/10/21   Jackquline Denmark, MD  levothyroxine (SYNTHROID, LEVOTHROID) 50 MCG tablet Take 50 mcg by mouth daily before breakfast.  11/21/16   [provider]  lisinopril (PRINIVIL,ZESTRIL) 5 MG tablet Take 5 mg by mouth daily.  10/16/16   [provider]  metoCLOPramide (REGLAN) 10 MG tablet Take 1 tablet (10 mg total) by mouth every 6 (six) hours. 06/12/21   Fondaw, Kathleene Hazel, PA  MINIVELLE 0.1 MG/24HR patch Place 1 patch onto the skin once a week. 11/04/16   [provider]  montelukast (SINGULAIR) 10 MG tablet TAKE 1 TABLET BY MOUTH EVERY DAY AS DIRECTED 06/26/21   Kozlow, Donnamarie Poag, MD  Multiple Vitamins-Minerals (HAIR SKIN AND NAILS FORMULA PO) Take 1 tablet by mouth daily.    [provider]  Multiple Vitamins-Minerals (MULTIVITAMIN PO) Take by mouth daily.    [provider]  NEXIUM 40 MG capsule Take 1 capsule (40 mg total) by mouth daily. 03/08/19   Jackquline Denmark, MD  Probiotic Product (DIGESTIVE ADVANTAGE PO) Take by mouth daily.    [provider]  promethazine (PHENERGAN) 25 MG tablet Take 25 mg by mouth as needed. 12/19/19   [provider]  Simethicone (GAS-X PO) Take 1 tablet by mouth 3 (three) times daily as needed.    [provider]  sucralfate (CARAFATE) 1 GM/10ML suspension  06/10/21   [provider]     Objective    Physical Exam: Vitals:   06/27/21 2021 06/27/21  2100 06/27/21 2130 06/27/21 2200  BP: 128/85 (!) 114/93 (!) 135/118 (!) 146/95  Pulse: (!) 109 (!) 109 87 99  Resp: _0 Temp: 97.7 F (36.5 C)     TempSrc: Oral     SpO2: 99% 99% 99% 98%    General: appears to be stated age; alert, oriented Skin: warm, dry,  no rash Head:  AT/[place] Mouth:  Oral mucosa membranes appear dry, normal dentition Neck: supple; trachea midline Heart:  RRR; did not appreciate any M/R/G Lungs: CTAB, did not appreciate any wheezes, rales, or rhonchi Abdomen: + BS; soft, ND, NT Vascular: 2+ pedal pulses b/l; 2+ radial pulses b/l Extremities: no peripheral edema, no muscle wasting Neuro: strength and sensation intact in upper and lower extremities b/l   Labs on Admission: I have personally reviewed following labs and imaging studies  CBC: Recent Labs  Lab 06/27/21 2055 06/27/21 2232  WBC 11.5*  --   HGB 13.0 11.2*  HCT 38.7 33.0*  MCV 92.8  --   PLT 258  --    Basic Metabolic Panel: Recent Labs  Lab 06/27/21 2055  NA 141  K 3.2*  CL 107  CO2 26  GLUCOSE 141*  BUN 30*  CREATININE 0.85  CALCIUM 8.9   GFR: Estimated Creatinine Clearance: 59.7 mL/min (by C-G formula based on SCr of 0.85 mg/dL). Liver Function Tests: Recent Labs  Lab 06/27/21 2055  AST 20  ALT 20  ALKPHOS 39  BILITOT 1.0  PROT 6.5  ALBUMIN 3.8   No results for input(s): LIPASE, AMYLASE in the last 168 hours. No results for input(s): AMMONIA in the last 168 hours. Coagulation Profile: Recent Labs  Lab 06/27/21 2055  INR 1.0   Cardiac Enzymes: No results for input(s): CKTOTAL, CKMB, CKMBINDEX, TROPONINI in the last 168 hours. BNP (last 3 results) No results for input(s): PROBNP in the last 8760 hours. HbA1C: No results for input(s): HGBA1C in the last 72 hours. CBG: No results for input(s): GLUCAP in the last 168 hours. Lipid Profile: No results for input(s): CHOL, HDL, LDLCALC, TRIG, CHOLHDL, LDLDIRECT in the last 72 hours. Thyroid Function Tests: No  results for input(s): TSH, T4TOTAL, FREET4, T3FREE, THYROIDAB in the last 72 hours. Anemia Panel: No results for input(s): VITAMINB12, FOLATE, FERRITIN, TIBC, IRON, RETICCTPCT in the last 72 hours. Urine analysis:    Component Value Date/Time   COLORURINE AMBER (A) 06/12/2021 1330   APPEARANCEUR HAZY (A) 06/12/2021 1330   LABSPEC 1.026 06/12/2021 1330   PHURINE 6.0 06/12/2021 1330   GLUCOSEU NEGATIVE 06/12/2021 1330   HGBUR SMALL (A) 06/12/2021 1330   BILIRUBINUR SMALL (A) 06/12/2021 1330   KETONESUR NEGATIVE 06/12/2021 1330   PROTEINUR 30 (A) 06/12/2021 1330   NITRITE NEGATIVE 06/12/2021 1330   LEUKOCYTESUR NEGATIVE 06/12/2021 1330    Radiological Exams on Admission: DG Chest Portable 1 View  Result Date: 06/27/2021 CLINICAL DATA:  GI bleed. EXAM: PORTABLE CHEST 1 VIEW COMPARISON:  Chest radiograph dated 06/12/2021. FINDINGS: No focal consolidation, pleural effusion, or pneumothorax. The cardiac silhouette is within normal limits. No acute osseous pathology. IMPRESSION: No active disease. Electronically Signed   By: Anner Crete M.D.   On: 06/27/2021 21:31     EKG: Independently reviewed, with result as described above.    Assessment/Plan   Sherri Ruiz is a 60 y.o. female with medical history significant for GERD, eosinophilic esophagitis, hypertension, who is admitted to [facility] on 06/27/2021 with acute upper GI bleed after presenting from home to [facility] ED complaining of hematemesis.    Principal Problem:   Acute upper GI bleed Active Problems:   Essential hypertension   Hyperlipidemia   Hypothyroidism   Acute blood loss anemia   Hypokalemia   Nausea & vomiting   Leukocytosis      #) Acute Upper GI Bleed: diagnosis on the basis of a reported  7-8 episodes of hematemesis associated with red appearing vomitus that started a few hours following outpatient EGD performed earlier today, as further detailed above, with interval elevation of BUN, as quantified  above. Not on any blood thinners as an outpatient, including no aspirin. Denies NSAID use. No known history of known underlying liver disease, and denies any history of alcohol abuse or recent alcohol consumption.  Differential includes superficial bleed from multiple biopsies taken during today's EGD.  As noted above, EGD earlier today revealed no evidence of active bleed, and was without overt intraoperative complications.  No evidence of esophageal varices, and therefore there does not appear to be an indication for octreotide at this time.  In the absence of a known liver disease, initiation of SBP prophylaxis does not appear to be warranted.  Case was discussed with the on-call GI physician, Dr. Lyndel Safe, who also performed EGD earlier today.  Dr. Lyndel Safe to consult, and will come to the hospital this evening to further evaluate the patient.  Of the patient has been normotensive throughout her ED course, she was initially noted to be mildly tachycardic, which improved with normal saline bolus,, which is concerning for significant volume loss in the setting of her presenting 7-8 episodes of hematemesis.  Presentation also appears to be associated with acute blood loss anemia, with hemoglobin now down to 11.2 relative to initial value of 13 and relative to 15 on 06/12/2021.  Patient conveys mild dizziness, but otherwise, aside from nausea, is asymptomatic at this time.  Type and screen performed in the ED today.  Given the presence of initial tachycardia, with hemoglobin trending down and potentially associated with a lag in true representation of current hemoglobin, albeit with potential element of delusional manipulation given interval IV fluid bolus, and given the volume of episodes associated with hematemesis, including most recent episode occurring within the last hour, will initiate PRBC transfusion at this time, as further detailed below, with close monitoring of ensuing hemoglobin and vital signs, while  awaiting additional GI recs this evening.  In the setting of acute upper GI bleed, will continue Protonix drip initiated in the ED.       Plan: NPO. Refraining from pharmacologic DVT prophylaxis. Monitor on telemetry. Monitor continuous pulse-ox. Maintain at least 2 large bore IV's. Check INR in the AM.  Transfuse 2 units PRBC, with H&H recheck ordered following completion of transfusion of second unit.  Of also asked the blood bank to stay 2 units ahead.  Every 4 hour H&H's have been ordered through 9 AM on 06/28/2021. Will closely monitor these ensuing Hgb levels and correlate these data points with the patient's overall clinical picture including vital signs to determine need for subsequent transfusion.  Dr. Lyndel Safe of GI consulted, and will evaluate the patient in the hospital this evening, with additional recs pending.  Continue Protonix drip.  Recheck BMP in the morning.  CBC in the morning.  As needed Zofran.  As needed IV Ativan for nausea/vomiting refractory to Zofran.        #) Acute blood loss anemia: in the setting of presenting acute upper GI bleed, hemoglobin now down to 11.2 relative to hemoglobin of 15 on 06/12/2021.  Type and screen performed in the ED this evening.  Will proceed with transfusion of 2 units PRBC at this time, as further described above, with close monitoring of ensuing hemoglobin trend correlating with the patient's symptoms and vital signs, as above.   Plan: work-up and management for presenting acute upper  GI bleed, as above, including transfusion of 2 units PBC.  Repeat H&H following completion of transfusion of second unit PRBC.  close monitoring of Q4H H&H's, with clinical evaluation for determination of need for blood transfusion, as further described above. Monitor on telemetry. Monitor continuous pulse-ox. NPO. Refraining from pharmacologic DVT prophylaxis. Check INR in the morning.  Gastroenterology consulted, as above.       #) Hypokalemia presenting serum  potassium noted to be 3.2.  Likely with contribution from increased GI losses in the form of presenting nausea/vomiting, as above.  Plan: Potassium chloride 30 mill equivalents IV every 3 hours x1 dose now.  Add on serum magnesium level.  Monitor on telemetry.  Repeat BMP in the morning.  As needed IV antiemetics, as above.        #) Leukocytosis: Mild elevation white blood cell count, which is suspected to be inflammatory in nature in the setting of EGD earlier today as well as considering nausea/vomiting and hematemesis, with component of hemoconcentration as a result of increased GI losses over the last several hours.  No evidence to suggest underlying infectious process at this time, criteria for sepsis are not met.  Of note, patient denies any acute urinary symptoms, while chest x-ray shows no evidence of acute cardiopulmonary process and COVID-19 PCR performed in the ED today was found to be negative.  Plan: Further evaluation management of acute upper gastrointestinal bleed, as above.  Repeat CBC in the morning.       #) Allergic rhinitis: Documented history of such, on cetirizine as an outpatient.  Does not appear to be on intranasal corticosteroids at home.  Plan: In the setting of current n.p.o. status, will hold home cetirizine for now.      #) Mild intermittent asthma: On Singulair as an outpatient.  No evidence of acute exacerbation at this time.   Plan: Add on serum magnesium level.  We will hold home Singulair for now in the setting of current n.p.o. status.        #) Essential hypertension: Documented history of such on lisinopril as well outpatient and hypertensive medication.  Normotensive in the ED, but warranting close ensuing monitoring of blood pressure in the setting of presenting acute upper gastrointestinal bleed.  Plan: In the setting of acute upper gastrointestinal bleed and current n.p.o. status, will hold lisinopril.  Close monitoring of ensuing  blood pressure via routine vital signs.      #) Hyperlipidemia: On atorvastatin as an outpatient.  Plan: Hold home statin for now in the setting of her n.p.o. status.      #) Acquired hypothyroidism: On Synthroid as an outpatient.  Plan: Hold on Synthroid for now in setting of n.p.o. status.      DVT prophylaxis: SCDs Code Status: Full code Family Communication: Case was discussed with the patient's husband, who was present at bedside Disposition Plan: Per Rounding Team Consults called: Case discussed with on-call GI, Dr. Lyndel Safe, who will consult and evaluate the patient in the hospital overnight;  Admission status: Observation; SDU     Of note, this patient was added by me to the following Admit List/Treatment Team: [email or web address].      PLEASE NOTE THAT DRAGON DICTATION SOFTWARE WAS USED IN THE CONSTRUCTION OF THIS NOTE.   [facility] Pager [phone] From [place]  Otherwise, please contact night-coverage  [email or web address] Password [facility]   06/27/2021, 10:58 PM

## 2021-06-27 NOTE — Patient Instructions (Addendum)
HANDOUTS PROVIDED ON: POST DILATION DIET, GASTRITIS, HIATAL HERNIA  The polyps removed/biopsies taken today have been sent for pathology.  The results can take 1-3 weeks to receive.    Follow the Post-Dilation Diet for today and then resume your previous diet tomorrow (9/2).  Resume your present medication schedule and continue Nexium.  Thank you for allowing [place] to care for you today!!!   YOU HAD AN ENDOSCOPIC PROCEDURE TODAY AT [place]:   Refer to the procedure report that was given to you for any specific questions about what was found during the examination.  If the procedure report does not answer your questions, please call your gastroenterologist to clarify.  If you requested that your care partner not be given the details of your procedure findings, then the procedure report has been included in a sealed envelope for you to review at your convenience later.  YOU SHOULD EXPECT: Some feelings of bloating in the abdomen. Passage of more gas than usual.  Walking can help get rid of the air that was put into your GI tract during the procedure and reduce the bloating. If you had a lower endoscopy (such as a colonoscopy or flexible sigmoidoscopy) you may notice spotting of blood in your stool or on the toilet paper. If you underwent a bowel prep for your procedure, you may not have a normal bowel movement for a few days.  Please Note:  You might notice some irritation and congestion in your nose or some drainage.  This is from the oxygen used during your procedure.  There is no need for concern and it should clear up in a day or so.  SYMPTOMS TO REPORT IMMEDIATELY:   Following upper endoscopy (EGD)  Vomiting of blood or coffee ground material  New chest pain or pain under the shoulder blades  Painful or persistently difficult swallowing  New shortness of breath  Fever of 100F or higher  Black, tarry-looking stools  For urgent or emergent issues, a gastroenterologist  can be reached at any hour by calling [phone]. Do not use MyChart messaging for urgent concerns.    DIET:  We do recommend a post dilation diet today and tomorrow start small meal at first, but then you may proceed to your regular diet.  Drink plenty of fluids but you should avoid alcoholic beverages for 24 hours.  ACTIVITY:  You should plan to take it easy for the rest of today and you should NOT DRIVE or use heavy machinery until tomorrow (because of the sedation medicines used during the test).    FOLLOW UP: Our staff will call the number listed on your records Monday morning between 7:15 am and 8:15 am following your procedure to check on you and address any questions or concerns that you may have regarding the information given to you following your procedure. If we do not reach you, we will leave a message.  We will attempt to reach you two times.  During this call, we will ask if you have developed any symptoms of COVID 19. If you develop any symptoms (ie: fever, flu-like symptoms, shortness of breath, cough etc.) before then, please call [phone].  If you test positive for Covid 19 in the 2 weeks post procedure, please call and report this information to [place].    If any biopsies were taken you will be contacted by phone or by letter within the next 1-3 weeks.  Please call us at [phone] if you have not  heard about the biopsies in 3 weeks.    SIGNATURES/CONFIDENTIALITY: You and/or your care partner have signed paperwork which will be entered into your electronic medical record.  These signatures attest to the fact that that the information above on your After Visit Summary has been reviewed and is understood.  Full responsibility of the confidentiality of this discharge information lies with you and/or your care-partner.

## 2021-06-27 NOTE — Progress Notes (Signed)
Report to PACU, RN, vss, BBS= Clear.  

## 2021-06-27 NOTE — ED Provider Notes (Signed)
[facility] DEPT Provider Note   CSN: [id number] Arrival date & time: 06/27/21  1955     History Chief Complaint  Patient presents with   Hematemesis    Sherri Ruiz is a 60 y.o. female.   Emesis Severity:  Severe Duration:  1 day Timing:  Constant Quality:  Coffee grounds and bright red blood Progression:  Worsening Chronicity:  New Recent urination:  Normal Relieved by:  Nothing Worsened by:  Nothing Ineffective treatments:  None tried Associated symptoms: no abdominal pain   Pt presents with acute gi bleeding.  Pt had an endoscopy procedure today.  She had esophageal dilitation during the procedure.    After going home she started to have vomiting of blood as well as dark stools.  She has felt lightheaded and faint.  The sx were severe.    Past Medical History:  Diagnosis Date   Allergic rhinitis    Allergy    Anxiety    OCC    Asthma    none since on Singulair started    Cataract    removed right eye, left forming    Congenital clotting factor deficiency (HCC)    Eosinophilic esophagitis    GERD (gastroesophageal reflux disease)    High blood pressure    High cholesterol    History of hepatitis A    Hypothyroidism    IBS (irritable bowel syndrome)    IDA (iron deficiency anemia)    LPRD (laryngopharyngeal reflux disease)    Penicillin allergy    Peripheral neuropathy    PONV (postoperative nausea and vomiting)    Due to Demerol    Vitamin B12 deficiency anemia     Patient Active Problem List   Diagnosis Date Noted   Chest pain at rest 07/20/2018   Essential hypertension 07/20/2018   Hyperlipidemia 07/20/2018   Hypothyroidism 07/20/2018   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 Mild gastrtitis. Gastric  polyps.    ESOPHAGOGASTRODUODENOSCOPY  12/25/2017   OTHER SURGICAL HISTORY     Hysterectomy   TOTAL ABDOMINAL HYSTERECTOMY     UPPER GASTROINTESTINAL ENDOSCOPY       OB History   No obstetric history on file.     Family History  Problem Relation Age of Onset   Clotting disorder Mother    High Cholesterol Mother    Asthma Mother    Stroke Mother    High blood pressure Father    Heart attack Father 39       [place].  Atrial fib.     Colon polyps Father    Cancer Sister    Asthma Daughter    Heart disease Brother        Pacemaker   Stomach cancer Maternal Aunt    Esophageal cancer Other    Colon cancer Neg Hx    Rectal cancer Neg Hx     Social History   Tobacco Use   Smoking status: Former    Types: Cigarettes    Quit date: 1989    Years since quitting: 33.6   Smokeless tobacco: Never  Vaping Use   Vaping Use: Never used  Substance Use Topics   Alcohol use: Not Currently   Drug use: Never    Home Medications Prior  to Admission medications   Medication Sig Start Date End Date Taking? Authorizing Provider  albuterol (VENTOLIN HFA) 108 (90 Base) MCG/ACT inhaler Inhale 2 puffs into the lungs 4 (four) times daily. Patient not taking: No sig reported 06/02/21   [provider]  ALPRAZolam Duanne Moron) 0.25 MG tablet Take 0.25 mg by mouth as needed for anxiety.  05/01/18   [provider]  Alum & Mag Hydroxide-Simeth (GI COCKTAIL) SUSP suspension Take 30 mLs by mouth 2 (two) times daily. Shake well. 06/27/21   Jackquline Denmark, MD  AMBULATORY NON FORMULARY MEDICATION Medication Name: GI Cocktail (Equal amounts of Maalox, Viscous Lidocaine, Bentyl)  Take 10 cc by mouth every 8 hours as needed. 03/11/21   Jackquline Denmark, MD  atorvastatin (LIPITOR) 20 MG tablet Take 20 mg by mouth at bedtime.  12/15/17   [provider]  Cetirizine HCl (ZYRTEC PO) Take 1 tablet by mouth daily.     [provider]  dicyclomine (BENTYL) 20 MG tablet Take 1 tablet  (20 mg total) by mouth 2 (two) times daily. 06/12/21   Tedd Sias, PA  esomeprazole (NEXIUM) 40 MG capsule Take 1 capsule (40 mg total) by mouth 2 (two) times daily. 06/10/21   Jackquline Denmark, MD  levothyroxine (SYNTHROID, LEVOTHROID) 50 MCG tablet Take 50 mcg by mouth daily before breakfast.  11/21/16   [provider]  lisinopril (PRINIVIL,ZESTRIL) 5 MG tablet Take 5 mg by mouth daily.  10/16/16   [provider]  metoCLOPramide (REGLAN) 10 MG tablet Take 1 tablet (10 mg total) by mouth every 6 (six) hours. 06/12/21   Fondaw, Kathleene Hazel, PA  MINIVELLE 0.1 MG/24HR patch Place 1 patch onto the skin once a week. 11/04/16   [provider]  montelukast (SINGULAIR) 10 MG tablet TAKE 1 TABLET BY MOUTH EVERY DAY AS DIRECTED 06/26/21   Kozlow, Donnamarie Poag, MD  Multiple Vitamins-Minerals (HAIR SKIN AND NAILS FORMULA PO) Take 1 tablet by mouth daily.    [provider]  Multiple Vitamins-Minerals (MULTIVITAMIN PO) Take by mouth daily.    [provider]  NEXIUM 40 MG capsule Take 1 capsule (40 mg total) by mouth daily. 03/08/19   Jackquline Denmark, MD  Probiotic Product (DIGESTIVE ADVANTAGE PO) Take by mouth daily.    [provider]  promethazine (PHENERGAN) 25 MG tablet Take 25 mg by mouth as needed. 12/19/19   [provider]  Simethicone (GAS-X PO) Take 1 tablet by mouth 3 (three) times daily as needed.    [provider]  sucralfate (CARAFATE) 1 GM/10ML suspension  06/10/21   [provider]    Allergies    Clindamycin/lincomycin, Meperidine, and Penicillin g  Review of Systems   Review of Systems  Constitutional:  Positive for diaphoresis.  Cardiovascular:  Negative for chest pain.  Gastrointestinal:  Positive for vomiting. Negative for abdominal pain.  Genitourinary:  Negative for difficulty urinating.  All other systems reviewed and are negative.  Physical Exam Updated Vital Signs BP 128/85   Pulse (!) 109   Temp 97.7 F  (36.5 C) (Oral)   Resp 20   LMP 07/20/2018   SpO2 99%   Physical Exam Vitals and nursing note reviewed.  Constitutional:      General: She is in acute distress.     Appearance: She is well-developed. She is ill-appearing.  HENT:     Head: Normocephalic and atraumatic.     Right Ear: External ear normal.     Left Ear: External ear  normal.  Eyes:     General: No scleral icterus.       Right eye: No discharge.        Left eye: No discharge.     Conjunctiva/sclera: Conjunctivae normal.  Neck:     Trachea: No tracheal deviation.  Cardiovascular:     Rate and Rhythm: Normal rate and regular rhythm.  Pulmonary:     Effort: Pulmonary effort is normal. No respiratory distress.     Breath sounds: Normal breath sounds. No stridor. No wheezing or rales.  Abdominal:     General: Bowel sounds are normal. There is no distension.     Palpations: Abdomen is soft.     Tenderness: There is abdominal tenderness in the epigastric area. There is no guarding or rebound.  Musculoskeletal:        General: No tenderness or deformity.     Cervical back: Neck supple.  Skin:    General: Skin is warm and dry.     Findings: No rash.  Neurological:     General: No focal deficit present.     Mental Status: She is alert.     Cranial Nerves: No cranial nerve deficit (no facial droop, extraocular movements intact, no slurred speech).     Sensory: No sensory deficit.     Motor: No abnormal muscle tone or seizure activity.     Coordination: Coordination normal.  Psychiatric:        Mood and Affect: Mood normal.    ED Results / Procedures / Treatments   Labs (all labs ordered are listed, but only abnormal results are displayed) Labs Reviewed  COMPREHENSIVE METABOLIC PANEL - Abnormal; Notable for the following components:      Result Value   Potassium 3.2 (*)    Glucose, Bld 141 (*)    BUN 30 (*)    All other components within normal limits  CBC - Abnormal; Notable for the following components:    WBC 11.5 (*)    All other components within normal limits  HEMOGLOBIN AND HEMATOCRIT, BLOOD - Abnormal; Notable for the following components:   Hemoglobin 11.2 (*)    HCT 33.0 (*)    All other components within normal limits  MAGNESIUM - Abnormal; Notable for the following components:   Magnesium 1.6 (*)    All other components within normal limits  COMPREHENSIVE METABOLIC PANEL - Abnormal; Notable for the following components:   Glucose, Bld 107 (*)    BUN 31 (*)    Calcium 8.0 (*)    Total Protein 5.7 (*)    Albumin 3.4 (*)    Alkaline Phosphatase 33 (*)    All other components within normal limits  CBC - Abnormal; Notable for the following components:   RBC 3.64 (*)    Hemoglobin 11.5 (*)    HCT 33.8 (*)    All other components within normal limits  HEMOGLOBIN AND HEMATOCRIT, BLOOD - Abnormal; Notable for the following components:   Hemoglobin 15.4 (*)    All other components within normal limits  RESP PANEL BY RT-PCR (FLU A&B, COVID) ARPGX2  MRSA NEXT GEN BY PCR, NASAL  PROTIME-INR  APTT  PROTIME-INR  MAGNESIUM  HIV ANTIBODY (ROUTINE TESTING W REFLEX)  HEMOGLOBIN AND HEMATOCRIT, BLOOD  HEMOGLOBIN AND HEMATOCRIT, BLOOD  TYPE AND SCREEN  ABO/RH  PREPARE RBC (CROSSMATCH)    EKG None  Radiology DG Chest Portable 1 View  Result Date: 06/27/2021 CLINICAL DATA:  GI bleed. EXAM: PORTABLE CHEST 1 VIEW COMPARISON:  Chest radiograph dated 06/12/2021. FINDINGS: No focal consolidation, pleural effusion, or pneumothorax. The cardiac silhouette is within normal limits. No acute osseous pathology. IMPRESSION: No active disease. Electronically Signed   By: Anner Crete M.D.   On: 06/27/2021 21:31    Procedures .Critical Care  Date/Time: 06/28/2021 3:07 PM Performed by: Dorie Rank, MD Authorized by: Dorie Rank, MD   Critical care provider statement:    Critical care time (minutes):  45   Critical care was time spent personally by me on the following activities:  Discussions  with consultants, evaluation of patient's response to treatment, examination of patient, ordering and performing treatments and interventions, ordering and review of laboratory studies, ordering and review of radiographic studies, pulse oximetry, re-evaluation of patient's condition, obtaining history from patient or surrogate and review of old charts   Medications Ordered in ED Medications  pantoprazole (PROTONIX) injection 40 mg (has no administration in time range)  ondansetron (ZOFRAN) injection 4 mg (has no administration in time range)  sodium chloride 0.9 % bolus 500 mL (has no administration in time range)    Followed by  0.9 %  sodium chloride infusion (has no administration in time range)  sterile water (preservative free) injection (has no administration in time range)    ED Course  I have reviewed the triage vital signs and the nursing notes.  Pertinent labs & imaging results that were available during my care of the patient were reviewed by me and considered in my medical decision making (see chart for details).  Clinical Course as of 06/28/21 1505  Thu Jun 27, 2021  2136 D/w Dr Lyndel Safe.  Notified him of the recurrent bleeding.  Will plan on admission and IV protonix.  Will also repeat Hgb and he will then decide if she needs to have emergent endoscopy tonight. [id number] Continue to call back.  He will be coming in to see the patient [JK]    Clinical Course User Index [JK] Dorie Rank, MD   MDM Rules/Calculators/A&P                           Patient presents to the ED with complaints of acute GI bleeding.  Noted to have recurrent episodes of hematemesis in the ED.  Status post endoscopy today.  Symptoms concerning for active bleeding.  No free air noted on chest x-ray or pneumothorax to suggest esophageal perforation.  Case discussed with Dr. Lyndel Safe GI.  Appreciate his assistance and evaluation in the ED.  Patient was started on PPI.  Initial hemoglobin was stable.  There was a  decline in her hemoglobin but no indication for transfusion.  Patient continued on Protonix infusion.  Admitted to the hospital for further treatment. Final Clinical Impression(s) / ED Diagnoses Final diagnoses:  Acute upper GI bleed     Dorie Rank, MD 06/28/21 [phone]

## 2021-06-27 NOTE — Progress Notes (Addendum)
Progress Note    ASSESSMENT AND PLAN:   UGI bleed after EGD with Bx/dil today. HD stable. Hb 13 to 11.  No significant abdominal pain.  Negative chest x-ray.  Most likely due to Mallory-Weiss tear. R/O other causes.  Doubt perforation.  PPI responsive EOE with GERD and small hiatal hernia.   Plan: -IV Protonix -Zofran/Phenergan for nausea/vomiting -Trend CBC -If continued bleeding, emergent EGD tonight.  We will standby tonight. -Keep her NPO until assessed by inpatient team tomorrow morning, in case EGD is needed tomorrow. -If any Abdo pain, CT chest/abdomen with IV contrast -Discussed extensively with pt and pt's husband. -We will follow along.     SUBJECTIVE   60 year old with EoE S/P EGD this morning with dilatation and biopsies Had several episodes of melena and then started having nausea/vomiting with hematemesis. She was brought into the emergency room Initial hemoglobin was 13 which dropped down to 11 She was also given IV fluids.  Had chest x-ray which was unremarkable  Throughout there was no significant abdominal pain except during episodes of nausea/vomiting.  Husband does confirm that.  No fever but did have chills.   EGD 06/27/2021 - Few circular rings in the proximal and distal esophagus without any obvious stricture. Dilated. Biopsied. - Small hiatal hernia. - Gastritis. Biopsied. - Multiple gastric polyps. Resected and retrieved x 3.  I have gone over EGD pictures with the patient's husband as well.  I spent over 45 minutes with the patient.  She did not have any further hematemesis.  She would like to hold off on EGD tonight unless active bleeding.  OBJECTIVE:     Vital signs in last 24 hours: Temp:  [97.7 F (36.5 C)] 97.7 F (36.5 C) (09/01 2021) Pulse Rate:  [87-109] 99 (09/01 2200) Resp:  [17-20] 19 (09/01 2200) BP: (114-146)/(85-118) 146/95 (09/01 2200) SpO2:  [98 %-99 %] 98 % (09/01 2200)   General:   Alert, EENT:  Normal  hearing, non icteric sclera, conjunctive pink.  Heart:  Regular rate and rhythm; no murmur.  No lower extremity edema   Pulm: Normal respiratory effort, lungs CTA bilaterally without wheezes or crackles. Abdomen:  Soft, nondistended, mild epigastric tenderness.  Normal bowel sounds,.       Neurologic:  Alert and  oriented x4;  grossly normal neurologically. Psych:  Pleasant, cooperative.  Normal mood and affect.  Anxious   Intake/Output from previous day: No intake/output data recorded. Intake/Output this shift: Total I/O In: 600 [IV Piggyback:600] Out: -   Lab Results: Recent Labs    06/27/21 2055 06/27/21 2232  WBC 11.5*  --   HGB 13.0 11.2*  HCT 38.7 33.0*  PLT 258  --    BMET Recent Labs    06/27/21 2055  NA 141  K 3.2*  CL 107  CO2 26  GLUCOSE 141*  BUN 30*  CREATININE 0.85  CALCIUM 8.9   LFT Recent Labs    06/27/21 2055  PROT 6.5  ALBUMIN 3.8  AST 20  ALT 20  ALKPHOS 39  BILITOT 1.0   PT/INR Recent Labs    06/27/21 2055  LABPROT 13.6  INR 1.0   Hepatitis Panel No results for input(s): HEPBSAG, HCVAB, HEPAIGM, HEPBIGM in the last 72 hours.  DG Chest Portable 1 View  Result Date: 06/27/2021 CLINICAL DATA:  GI bleed. EXAM: PORTABLE CHEST 1 VIEW COMPARISON:  Chest radiograph dated 06/12/2021. FINDINGS: No focal consolidation, pleural effusion, or pneumothorax. The cardiac silhouette is within normal  limits. No acute osseous pathology. IMPRESSION: No active disease. Electronically Signed   By: Anner Crete M.D.   On: 06/27/2021 21:31     Active Problems:   Acute upper GI bleed     LOS: 0 days     Carmell Austria, MD 06/27/2021, 11:31 PM [facility] [phone]

## 2021-06-27 NOTE — Progress Notes (Signed)
Chief Complaint:   Referring Provider:  Nicoletta Dress, MD      ASSESSMENT AND PLAN;   #1. LUQ abdominal pain (resolved).  Neg CT AP 02/2019.  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 Colon 03/21/2020 small polyp, mod-sever div #2. PPI responsive EoE with GERD/NCCP. Dx 05/2014, treated with PPIs and Flovent, did very well on food restriction.  Now with intermittent dysphagia. #3. H/O diverticulitis, treated with antibiotics 11/2018. Has mod-severe sigmoid div with possible muscular hypertrophy/stenosis. #4. IBS-C  Plan - Continue nexium 40 mg p.o. QD #90, 4 refiils - Restart fiber gummies qd.  Can use MiraLAX on PRN basis. - Avoid diary, eggs, potatoes, chocolate and caffeine as suggested by Dr. Neldon Mc. - GI cocktail (equal amounts of Maalox, viscous lidocaine, dicyclomine or Donnatal).  10 cc p.o. every 8 hours as needed. #120 ml. 2 refills - Avoid NSAIDs. - EGD with dil sept 2022.    HPI:    Sherri Ruiz is a 60 y.o. female   FU  Feels better  Had N/V 4 weeks ago, lasting for 2 days.  The entire family including granddaughter was sick.  Everybody had gastroenteritis.  It has resolved.  She also had 1 episode of coffee-ground emesis, nothing thereafter.  No melena.  Her abdominal pain is much better.  Has occasional dysphagia mostly to breads.  No heartburn.  Has been compliant with Nexium.  Back from [place] cruise.  She is planning to go to [place] in July.  Would like to wait for EGD until after she comes back.  Has occasional mild constipation.  No diarrhea.  No significant abdominal bloating.  Lately has not been very compliant with the diet.  She understands and will try to be more compliant from now onwards.   From previous notes: Took a brief course of Flovent, then had oral thrush requiring nystatin.  She has stopped taking Flovent.  She is on restricted diet as suggested by Dr. Neldon Mc.  She tried reducing Nexium to 20 mg p.o.  once a day but then had breakthrough heartburn.  She is back on 40 mg p.o. once a day.  Doing well.  No odynophagia or dysphagia.  Rare breakthrough symptoms controlled by GI cocktail/Mylanta.  Stress triggers attacks as well.  Has been seeing a counselor  We had gone over previous CT Abdo/pelvis.  Complex cyst in the liver.  She does want to hold off on MRI at the present time.  Oct 30, 2020 - had covid  Wt Readings from Last 3 Encounters:  06/27/21 127 lb (57.6 kg)  06/12/21 127 lb (57.6 kg)  06/12/21 127 lb (57.6 kg)     Does admit that she has been under considerable stress due to family problems (daughters with the grandkids had moved in.  At one point she had 11 people living in her home, she also recently has been promoted in her job).  1 daughter is a Marine scientist at surgical floor [name].  Past GI Proc: EGD 03/21/2020  -Few circular rings in the distal esophagus (bx-eosinophilic esophagitis Eos> 30 per high-power field) -Small transient hiatal hernia. -Multiple gastric polyps. Bx- fundic gland polyps. -Mild gastritis. Biopsied.  EGD 05/2014-wide-open cervical esophageal web, mild gastritis, gastric polyps.  Eso Bx- showed increased eosinophils over 20 per HPF c/w eosinophilic esophagitis.  Gastric Bx- reactive gastropathy, intestinal metaplasia.  Subsequent EGD [id number] of eosinophilic esophagitis on biopsies, gastric mapping negative for intestinal metaplasia, small incidental gastric polyps measuring  4 to 6 mm.  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  Otherwise normal to TI..  10/07/2013-good prep, moderate sigmoid diverticulosis,  small internal hemorrhoids.  Otherwise normal to TI.  Next due 09/2023 unless with any new problems. 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 - CT AP 02/2019: IMPRESSION: 1. No acute findings in the abdomen or pelvis. Relatively advanced left colonic diverticulosis without diverticulitis. 2. 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 3. The 16 mm low-density lesion in the anterior right liver may be a complex cysts, but cannot be definitively characterized. MRI of the abdomen with and without contrast recommended to further evaluate. She wanted to hold off on MRI Past Medical History:  Diagnosis Date   Allergic rhinitis    Allergy    Anxiety    OCC    Asthma    none since on Singulair started    Cataract    removed right eye, left forming    Congenital clotting factor deficiency (HCC)    Eosinophilic esophagitis    GERD (gastroesophageal reflux disease)    High blood pressure    High cholesterol    History of hepatitis A    Hypothyroidism    IBS (irritable bowel syndrome)    IDA (iron deficiency anemia)    LPRD (laryngopharyngeal reflux disease)    Penicillin allergy    Peripheral neuropathy    PONV (postoperative nausea and vomiting)    Due to Demerol    Vitamin B12 deficiency anemia     Past Surgical History:  Procedure Laterality Date   CATARACT EXTRACTION  03/2020   CHOLECYSTECTOMY     COLONOSCOPY     ESOPHAGOGASTRODUODENOSCOPY  06/16/2014   Wide open cervial esophageal web. Mild gastrtitis. Gastric polyps.    ESOPHAGOGASTRODUODENOSCOPY  12/25/2017   OTHER SURGICAL HISTORY     Hysterectomy   TOTAL ABDOMINAL HYSTERECTOMY     UPPER GASTROINTESTINAL ENDOSCOPY      Family History  Problem Relation Age of Onset   Clotting disorder Mother    High Cholesterol Mother    Asthma Mother    Stroke Mother    High blood pressure Father    Heart attack Father 7       [place].  Atrial  fib.     Colon polyps Father    Cancer Sister    Asthma Daughter    Heart disease Brother        Pacemaker   Stomach cancer Maternal Aunt    Esophageal cancer Other    Colon cancer Neg Hx    Rectal cancer Neg Hx     Social History   Tobacco Use   Smoking status: Former    Types: Cigarettes    Quit date: 1989    Years since quitting: 33.6   Smokeless tobacco: Never  Vaping Use   Vaping Use: Never used  Substance Use Topics   Alcohol use: Not Currently   Drug use: Never    Current Outpatient Medications  Medication Sig Dispense Refill   ALPRAZolam (XANAX) 0.25 MG tablet Take 0.25 mg by mouth as needed for anxiety.   0   atorvastatin (LIPITOR) 20 MG tablet Take 20  mg by mouth at bedtime.   1   Cetirizine HCl (ZYRTEC PO) Take 1 tablet by mouth daily.      esomeprazole (NEXIUM) 40 MG capsule Take 1 capsule (40 mg total) by mouth 2 (two) times daily. 180 capsule 3   levothyroxine (SYNTHROID, LEVOTHROID) 50 MCG tablet Take 50 mcg by mouth daily before breakfast.      lisinopril (PRINIVIL,ZESTRIL) 5 MG tablet Take 5 mg by mouth daily.      MINIVELLE 0.1 MG/24HR patch Place 1 patch onto the skin once a week.     montelukast (SINGULAIR) 10 MG tablet TAKE 1 TABLET BY MOUTH EVERY DAY AS DIRECTED 30 tablet 0   Multiple Vitamins-Minerals (HAIR SKIN AND NAILS FORMULA PO) Take 1 tablet by mouth daily.     Multiple Vitamins-Minerals (MULTIVITAMIN PO) Take by mouth daily.     NEXIUM 40 MG capsule Take 1 capsule (40 mg total) by mouth daily. 30 capsule 5   Probiotic Product (DIGESTIVE ADVANTAGE PO) Take by mouth daily.     Simethicone (GAS-X PO) Take 1 tablet by mouth 3 (three) times daily as needed.     sucralfate (CARAFATE) 1 GM/10ML suspension      albuterol (VENTOLIN HFA) 108 (90 Base) MCG/ACT inhaler Inhale 2 puffs into the lungs 4 (four) times daily. (Patient not taking: No sig reported)     AMBULATORY NON FORMULARY MEDICATION Medication Name: GI Cocktail (Equal amounts of Maalox,  Viscous Lidocaine, Bentyl)  Take 10 cc by mouth every 8 hours as needed. 120 mL 2   dicyclomine (BENTYL) 20 MG tablet Take 1 tablet (20 mg total) by mouth 2 (two) times daily. 20 tablet 0   metoCLOPramide (REGLAN) 10 MG tablet Take 1 tablet (10 mg total) by mouth every 6 (six) hours. 30 tablet 0   promethazine (PHENERGAN) 25 MG tablet Take 25 mg by mouth as needed.     Current Facility-Administered Medications  Medication Dose Route Frequency Provider Last Rate Last Admin   0.9 %  sodium chloride infusion  500 mL Intravenous Once Jackquline Denmark, MD        Allergies  Allergen Reactions   Clindamycin/Lincomycin Swelling   Meperidine Nausea And Vomiting    unknown   Penicillin G Other (See Comments)    Unknown/childhood allergy Has patient had a PCN reaction causing immediate rash, facial/tongue/throat swelling, SOB or lightheadedness with hypotension: Yes Has patient had a PCN reaction causing severe rash involving mucus membranes or skin necrosis: Unknown Has patient had a PCN reaction that required hospitalization: No Has patient had a PCN reaction occurring within the last 10 years: Unknown If all of the above answers are "NO", then may proceed with Cephalosporin use.      Review of Systems:  Negative except for HPI     Physical Exam:    Today's Vitals   06/27/21 0746 06/27/21 0827  BP: 126/80 (!) 154/91  Pulse: 61 (!) 57  Resp:  (!) 9  Temp: 97.8 F (36.6 C)   TempSrc: Temporal   SpO2: 100% 100%  Weight: 127 lb (57.6 kg)   Height: '5\' 3"'$  (1.6 m)    Body mass index is 22.5 kg/m. Constitutional:  Well-developed, in no acute distress. Psychiatric: Normal mood and affect. Behavior is normal. HEENT: Pupils normal.  Conjunctivae are normal. No scleral icterus. Cardiovascular: Normal rate, regular rhythm. No edema Pulmonary/chest: Effort normal and breath sounds normal. No wheezing, rales or rhonchi. Abdominal: Soft, nondistended.  Mild epigastric/left upper quadrant  tenderness. Bowel  sounds active throughout. There are no masses palpable. No hepatomegaly. Neurological: Alert and oriented to person place and time. Skin: Skin is warm and dry. No rashes noted.  Data Reviewed: I have personally reviewed following labs and imaging studies  CBC: CBC Latest Ref Rng & Units 06/12/2021 02/13/2020 03/16/2019  WBC 4.0 - 10.5 K/uL 4.5 5.5 4.5  Hemoglobin 12.0 - 15.0 g/dL 15.0 13.5 13.8  Hematocrit 36.0 - 46.0 % 43.6 39.5 41.2  Platelets 150 - 400 K/uL 203 - 226    CMP: CMP Latest Ref Rng & Units 06/12/2021 03/16/2019 07/20/2018  Glucose 70 - 99 mg/dL 105(H) 94 -  BUN 6 - 20 mg/dL 8 4(L) -  Creatinine 0.44 - 1.00 mg/dL 0.89 0.77 0.81  Sodium 135 - 145 mmol/L 137 135 -  Potassium 3.5 - 5.1 mmol/L 3.4(L) 4.2 -  Chloride 98 - 111 mmol/L 103 98 -  CO2 22 - 32 mmol/L 27 26 -  Calcium 8.9 - 10.3 mg/dL 9.0 9.2 -  Total Protein 6.5 - 8.1 g/dL 6.4(L) 6.0 -  Total Bilirubin 0.3 - 1.2 mg/dL 0.9 0.4 -  Alkaline Phos 38 - 126 U/L 37(L) 48 -  AST 15 - 41 U/L 21 20 -  ALT 0 - 44 U/L 20 19 -      Carmell Austria, MD 06/27/2021, 8:31 AM  Cc: Nicoletta Dress, MD

## 2021-06-27 NOTE — Progress Notes (Signed)
Brief note regarding preliminary plan, with full H&P to follow:  60 year old female who is admitted with acute upper GI bleed after undergoing outpatient EGD with esophageal dilation and biopsy earlier this morning.  After returning home from this outpatient procedure, she noted several episodes of loose, dark appearing stool in the absence of bright red blood.  Subsequently, she developed nausea and experienced 3-4 episodes of hematemesis, noting that her emesis was associated with "red coloration" in the absence of coffee-ground appearance.  She experienced 3 more episodes of hematemesis in the ED. she denies any significant abdominal discomfort.  But reports refractory nausea after initial improvement following 2 doses of IV Zofran.  Denies any associated chest pain or shortness of breath.  Initially noted to be mildly tachycardic with heart rates in the low 100s, which improved following administration of a 500 cc normal saline bolus, and she has remained normotensive throughout.  Started on Protonix drip.  Typed and screened.  EDP, Dr. Tomi Bamberger, as notified Dr. Lyndel Safe of GI, and Dr. Lyndel Safe is coming into the hospital this evening to further assess the patient.  Initial hemoglobin 13 relative to 15 when checked a few weeks ago.  Repeat hemoglobin performed approximately 2 hours after initial check noted to be 11.2.  While there may be an element of hemodilution given interval administration of 500 cc normal saline bolus, given the volume of her hematemesis and the potential for flag in clinical relevance of the septated hemoglobin finding, will initiate transfusion of 2 units PRBC.  Every 4 hour hemoglobin checks through 9 AM of been ordered, including repeat hemoglobin to be checked following completion of transfusion of 2 units PRBC.  Repeat CBC in the morning.  NPO.  SCDs.  Not on any blood thinners, no history of liver disease.    Babs Bertin, DO Hospitalist

## 2021-06-27 NOTE — Progress Notes (Signed)
Patient sleeping on arrival to [facility].  Patients daughter call to be with patient for report with Dr.  Lyndel Safe.  Patient woke up nauseated and heaving. CRNA aware and administering anti-nausea medication.  Patient complains of stomach pain.  MD aware and arrived to assess patient..  Patient is currently speaking with Dr. Lyndel Safe and no longer moaning.  Will continue to monitor.  Patient will be discharged home when symptoms subside.

## 2021-06-27 NOTE — Progress Notes (Signed)
Called to room to assist during endoscopic procedure.  Patient ID and intended procedure confirmed with present staff. Received instructions for my participation in the procedure from the performing physician.  

## 2021-06-27 NOTE — ED Notes (Signed)
Pt actively bleeding and unable to complete orthostatic VS at this time

## 2021-06-27 NOTE — Op Note (Signed)
[facility] Patient Name: Sherri Ruiz Procedure Date: 06/27/2021 8:18 AM MRN: [id number] Endoscopist: Jackquline Denmark , MD Age: 60 Referring MD:  Date of Birth: 06/16/1961 Gender: Female Account #: [id number] Procedure:                Upper GI endoscopy Indications:              Dysphagia inpt with H/O EoE. Medicines:                Monitored Anesthesia Care Procedure:                Pre-Anesthesia Assessment:                           - Prior to the procedure, a History and Physical                            was performed, and patient medications and                            allergies were reviewed. The patient's tolerance of                            previous anesthesia was also reviewed. The risks                            and benefits of the procedure and the sedation                            options and risks were discussed with the patient.                            All questions were answered, and informed consent                            was obtained. Prior Anticoagulants: The patient has                            taken no previous anticoagulant or antiplatelet                            agents. ASA Grade Assessment: II - A patient with                            mild systemic disease. After reviewing the risks                            and benefits, the patient was deemed in                            satisfactory condition to undergo the procedure.                           After obtaining informed consent, the endoscope was  passed under direct vision. Throughout the                            procedure, the patient's blood pressure, pulse, and                            oxygen saturations were monitored continuously. The                            GIF [id number] was introduced through the                            mouth, and advanced to the second part of duodenum.                            The upper GI endoscopy was  accomplished without                            difficulty. The patient tolerated the procedure                            well. Scope In: Scope Out: Findings:                 The examined esophagus was normal except for few                            circular rings in the proximal and distal                            esophagus. No strictures. Since patient is having                            dysphagia, we elected to dilate the esophagus. The                            scope was withdrawn. Dilation was performed with a                            Maloney dilator with mild resistance at 46 Fr.                            Biopsies were obtained from the proximal and distal                            esophagus with cold forceps for histology of                            suspected eosinophilic esophagitis.                           A small transient hiatal hernia was present.                           Localized  mild inflammation characterized by                            erythema was found in the gastric antrum. Biopsies                            were taken with a cold forceps for histology.                           Multiple (20-25) 4 to 6 mm sessile polyps with no                            bleeding and no stigmata of recent bleeding were                            found in the gastric body. Three polyps were                            removed with a cold snare. Resection and retrieval                            were complete.                           The examined duodenum was normal. Complications:            No immediate complications. Estimated Blood Loss:     Estimated blood loss: none. Impression:               - Few circular rings in the proximal and distal                            esophagus without any obvious stricture. Dilated.                            Biopsied.                           - Small hiatal hernia.                           - Gastritis. Biopsied.                            - Multiple gastric polyps. Resected and retrieved x                            3. Recommendation:           - Patient has a contact number available for                            emergencies. The signs and symptoms of potential                            delayed complications were discussed with the  patient. Return to normal activities tomorrow.                            Written discharge instructions were provided to the                            patient.                           - Post dilatation diet.                           - Continue present medications including Nexium.                           - Await pathology results.                           - The findings and recommendations were discussed                            with the patient's family. Jackquline Denmark, MD 06/27/2021 9:01:46 AM This report has been signed electronically.

## 2021-06-27 NOTE — Progress Notes (Signed)
Pt's states no medical or surgical changes since previsit or office visit. 

## 2021-06-28 DIAGNOSIS — D62 Acute posthemorrhagic anemia: Secondary | ICD-10-CM | POA: Diagnosis not present

## 2021-06-28 DIAGNOSIS — K922 Gastrointestinal hemorrhage, unspecified: Secondary | ICD-10-CM | POA: Diagnosis not present

## 2021-06-28 DIAGNOSIS — R112 Nausea with vomiting, unspecified: Secondary | ICD-10-CM | POA: Insufficient documentation

## 2021-06-28 DIAGNOSIS — D72829 Elevated white blood cell count, unspecified: Secondary | ICD-10-CM | POA: Diagnosis present

## 2021-06-28 DIAGNOSIS — J4520 Mild intermittent asthma, uncomplicated: Secondary | ICD-10-CM | POA: Insufficient documentation

## 2021-06-28 DIAGNOSIS — E876 Hypokalemia: Secondary | ICD-10-CM | POA: Diagnosis present

## 2021-06-28 DIAGNOSIS — J309 Allergic rhinitis, unspecified: Secondary | ICD-10-CM

## 2021-06-28 LAB — CBC
HCT: 33.8 % — ABNORMAL LOW (ref 36.0–46.0)
Hemoglobin: 11.5 g/dL — ABNORMAL LOW (ref 12.0–15.0)
MCH: 31.6 pg (ref 26.0–34.0)
MCHC: 34 g/dL (ref 30.0–36.0)
MCV: 92.9 fL (ref 80.0–100.0)
Platelets: 180 10*3/uL (ref 150–400)
RBC: 3.64 MIL/uL — ABNORMAL LOW (ref 3.87–5.11)
RDW: 13.2 % (ref 11.5–15.5)
WBC: 9.6 10*3/uL (ref 4.0–10.5)
nRBC: 0 % (ref 0.0–0.2)

## 2021-06-28 LAB — MAGNESIUM: Magnesium: 1.6 mg/dL — ABNORMAL LOW (ref 1.7–2.4)

## 2021-06-28 LAB — COMPREHENSIVE METABOLIC PANEL
ALT: 17 U/L (ref 0–44)
AST: 21 U/L (ref 15–41)
Albumin: 3.4 g/dL — ABNORMAL LOW (ref 3.5–5.0)
Alkaline Phosphatase: 33 U/L — ABNORMAL LOW (ref 38–126)
Anion gap: 8 (ref 5–15)
BUN: 31 mg/dL — ABNORMAL HIGH (ref 6–20)
CO2: 24 mmol/L (ref 22–32)
Calcium: 8 mg/dL — ABNORMAL LOW (ref 8.9–10.3)
Chloride: 109 mmol/L (ref 98–111)
Creatinine, Ser: 0.71 mg/dL (ref 0.44–1.00)
GFR, Estimated: >60 mL/min (ref >60)
Glucose, Bld: 107 mg/dL — ABNORMAL HIGH (ref 70–99)
Potassium: 3.6 mmol/L (ref 3.5–5.1)
Sodium: 141 mmol/L (ref 135–145)
Total Bilirubin: 1.1 mg/dL (ref 0.3–1.2)
Total Protein: 5.7 g/dL — ABNORMAL LOW (ref 6.5–8.1)

## 2021-06-28 LAB — HEMOGLOBIN AND HEMATOCRIT, BLOOD
HCT: 45.4 % (ref 36.0–46.0)
HCT: 37.6 % (ref 36.0–46.0)
Hemoglobin: 15.4 g/dL — ABNORMAL HIGH (ref 12.0–15.0)
Hemoglobin: 12.8 g/dL (ref 12.0–15.0)

## 2021-06-28 LAB — PROTIME-INR
INR: 1 (ref 0.8–1.2)
Prothrombin Time: 13.5 seconds (ref 11.4–15.2)

## 2021-06-28 LAB — MRSA NEXT GEN BY PCR, NASAL: MRSA by PCR Next Gen: NOT DETECTED

## 2021-06-28 MED ORDER — MAGNESIUM SULFATE 2 GM/50ML IV SOLN
2.0000 g | Freq: Once | INTRAVENOUS | Status: AC
  Administered 2021-06-28: 2 g via INTRAVENOUS
  Filled 2021-06-28: qty 50

## 2021-06-28 MED ORDER — LEVOTHYROXINE SODIUM 50 MCG PO TABS
50.0000 ug | ORAL_TABLET | Freq: Every day | ORAL | Status: DC
  Administered 2021-06-29 – 2021-06-30 (×2): 50 ug via ORAL
  Filled 2021-06-28 (×2): qty 1

## 2021-06-28 MED ORDER — LISINOPRIL 5 MG PO TABS
5.0000 mg | ORAL_TABLET | Freq: Every day | ORAL | Status: DC
  Administered 2021-06-29 – 2021-06-30 (×2): 5 mg via ORAL
  Filled 2021-06-28: qty 2
  Filled 2021-06-28: qty 1
  Filled 2021-06-28: qty 2

## 2021-06-28 MED ORDER — POTASSIUM CHLORIDE 10 MEQ/100ML IV SOLN
10.0000 meq | INTRAVENOUS | Status: AC
  Administered 2021-06-28 (×3): 10 meq via INTRAVENOUS
  Filled 2021-06-28 (×3): qty 100

## 2021-06-28 MED ORDER — MAGIC MOUTHWASH W/LIDOCAINE
5.0000 mL | Freq: Three times a day (TID) | ORAL | Status: DC | PRN
  Administered 2021-06-28 – 2021-06-29 (×2): 5 mL via ORAL
  Filled 2021-06-28 (×4): qty 5

## 2021-06-28 MED ORDER — ATORVASTATIN CALCIUM 20 MG PO TABS
20.0000 mg | ORAL_TABLET | Freq: Every day | ORAL | Status: DC
  Administered 2021-06-28 – 2021-06-29 (×2): 20 mg via ORAL
  Filled 2021-06-28: qty 2
  Filled 2021-06-28: qty 1

## 2021-06-28 MED ORDER — HYDRALAZINE HCL 20 MG/ML IJ SOLN: 10.0000 mg | INTRAMUSCULAR | Status: DC | PRN

## 2021-06-28 MED ORDER — SODIUM CHLORIDE 0.9 % IV SOLN
INTRAVENOUS | Status: DC | PRN
  Administered 2021-06-28: 250 mL via INTRAVENOUS

## 2021-06-28 MED ORDER — CHLORHEXIDINE GLUCONATE CLOTH 2 % EX PADS
6.0000 | MEDICATED_PAD | Freq: Every day | CUTANEOUS | Status: DC
  Administered 2021-06-29: 6 via TOPICAL

## 2021-06-28 MED ORDER — ALPRAZOLAM 0.25 MG PO TABS: 0.2500 mg | ORAL_TABLET | Freq: Every day | ORAL | Status: DC | PRN

## 2021-06-28 MED ORDER — LABETALOL HCL 5 MG/ML IV SOLN
10.0000 mg | INTRAVENOUS | Status: DC | PRN
  Filled 2021-06-28 (×2): qty 4

## 2021-06-28 NOTE — Assessment & Plan Note (Signed)
-   likely reactive; improved

## 2021-06-28 NOTE — Assessment & Plan Note (Addendum)
-   Presumed from GI losses from vomiting - Repleted

## 2021-06-28 NOTE — Assessment & Plan Note (Signed)
-   Blood pressure has improved since admission - Will resume lisinopril

## 2021-06-28 NOTE — Assessment & Plan Note (Signed)
-   resume Singulair at discharge

## 2021-06-28 NOTE — Assessment & Plan Note (Signed)
Continue statin. 

## 2021-06-28 NOTE — Assessment & Plan Note (Addendum)
-   see GIB 

## 2021-06-28 NOTE — Progress Notes (Signed)
Progress Note Hospital Day: 2  Chief Complaint:    upper GI bleed     ASSESSMENT AND PLAN   # 59 yo female with history of EoE, HTN. Admitted yesterday for upper GI bleeding following EGD with esophageal dilation, removal of three gastric polyps and biopsies of gastritis. She is hemodynamically stable. No further further bleeding.  --Still feels weak ( and bloated) this am.  --Will try clear liquids.  --Will continue PPI infusion for now. --Gastric biopsies pending.    #Acute blood loss anemia secondary to above. Baseline hemoglobin 13-15, down to 11.2. with GI bleed.  She received 2 uPRBC ( one yesterday and another this am), follow up CBC pending.   # Hypokalemia / hypomagnesemia. Repletion in progress.    DIAGNOSTIC STUDIES  06/27/21 EGD for dysphagia --Few circular rings in the proximal and distal esophagus without any obvious stricture. Dilated. Biopsied. - Small hiatal hernia. - Gastritis. Biopsied. - Multiple gastric polyps. Resected and retrieved x 3.   SUBJECTIVE     Feels bloated. No abdominal pain , just a little sore. No chest pain.    OBJECTIVE      Scheduled inpatient medications:   sodium chloride   Intravenous Once   Chlorhexidine Gluconate Cloth  6 each Topical Daily   Continuous inpatient infusions:   sodium chloride Stopped (06/28/21 0821)   sodium chloride Stopped (06/28/21 0825)   pantoprazole 8 mg/hr (06/28/21 0916)   potassium chloride 10 mEq (06/28/21 0933)   promethazine (PHENERGAN) injection (IM or IVPB) Stopped (06/28/21 0033)   PRN inpatient medications: sodium chloride, acetaminophen **OR** acetaminophen, LORazepam, ondansetron (ZOFRAN) IV, promethazine (PHENERGAN) injection (IM or IVPB)  Vital signs in last 24 hours: Temp:  [97.7 F (36.5 C)-98.5 F (36.9 C)] 98.4 F (36.9 C) (09/02 0815) Pulse Rate:  [61-109] 63 (09/02 0815) Resp:  [13-21] 16 (09/02 0815) BP: (94-146)/(65-118) 111/72 (09/02 0815) SpO2:  [97 %-100 %] 99 %  (09/02 0815) Weight:  [57.9 kg] 57.9 kg (09/02 0252) Last BM Date: 06/27/21  Intake/Output Summary (Last 24 hours) at 06/28/2021 0956 Last data filed at 06/28/2021 0900 Gross per 24 hour  Intake 3074.15 ml  Output --  Net 3074.15 ml     Physical Exam:  General: Alert female in NAD Heart:  Regular rate and rhythm. No lower extremity edema Pulmonary: Normal respiratory effort Abdomen: Soft, nondistended, mild diffuse tenderness.  Hypoactive bowel sounds.  Normal bowel sounds.  Neurologic: Alert and oriented Psych: Pleasant. Cooperative.   Filed Weights   06/28/21 0252  Weight: 57.9 kg    Intake/Output from previous day: 09/01 0701 - 09/02 0700 In: 2331.6 [I.V.:1159.5; Blood:375; IV Piggyback:797.1] Out: -  Intake/Output this shift: Total I/O In: 742.6 [I.V.:342.8; Blood:310; IV Piggyback:89.8] Out: -     Lab Results: Recent Labs    06/27/21 2055 06/27/21 2232 06/28/21 0127  WBC 11.5*  --  9.6  HGB 13.0 11.2* 11.5*  HCT 38.7 33.0* 33.8*  PLT 258  --  180   BMET Recent Labs    06/27/21 2055 06/28/21 0127  NA 141 141  K 3.2* 3.6  CL 107 109  CO2 26 24  GLUCOSE 141* 107*  BUN 30* 31*  CREATININE 0.85 0.71  CALCIUM 8.9 8.0*   LFT Recent Labs    06/28/21 0127  PROT 5.7*  ALBUMIN 3.4*  AST 21  ALT 17  ALKPHOS 33*  BILITOT 1.1   PT/INR Recent Labs    06/27/21 2055 06/28/21 0127  LABPROT  13.6 13.5  INR 1.0 1.0   Hepatitis Panel No results for input(s): HEPBSAG, HCVAB, HEPAIGM, HEPBIGM in the last 72 hours.  DG Chest Portable 1 View  Result Date: 06/27/2021 CLINICAL DATA:  GI bleed. EXAM: PORTABLE CHEST 1 VIEW COMPARISON:  Chest radiograph dated 06/12/2021. FINDINGS: No focal consolidation, pleural effusion, or pneumothorax. The cardiac silhouette is within normal limits. No acute osseous pathology. IMPRESSION: No active disease. Electronically Signed   By: Anner Crete M.D.   On: 06/27/2021 21:31        Principal Problem:   Acute upper  GI bleed Active Problems:   Essential hypertension   Hyperlipidemia   Hypothyroidism   Acute blood loss anemia   Hypokalemia   Nausea & vomiting   Leukocytosis     LOS: 0 days   Tye Savoy ,NP 06/28/2021, 9:56 AM

## 2021-06-28 NOTE — Assessment & Plan Note (Signed)
-   Resume Singulair at discharge

## 2021-06-28 NOTE — Progress Notes (Signed)
Progress Note    Sherri Ruiz   [id number]  DOB: 1961/07/11  DOA: 06/27/2021     0  PCP: Nicoletta Dress, MD  Initial CC: hematemesis  Hospital Course: Sherri Ruiz is a 60 yo female with PMH eosinophilic esophagitis, GERD, hypertension who presented to the ER after developing episodes of hematemesis at home. She had recently undergone EGD on 06/27/2021 and underwent dilation of proximal and distal esophageal circular rings.  She also had gastric polyps which were removed by cold snare. She was evaluated in the ER and admitted for further monitoring with trending of hemoglobin.  She was initiated on a Protonix drip as well. Hemoglobin on admission was noted to be 13 g/dL.  Interval History:  No further vomiting overnight since admission and no report of dark/bloody stools. Feeling much better in general she says.   ROS: Constitutional: negative for chills and fevers, Respiratory: negative for cough and sputum, Cardiovascular: negative for chest pain, and Gastrointestinal: negative for abdominal pain  Assessment & Plan: * Acute upper GI bleed - Unclear etiology.  Possibly due to Mallory-Weiss tear from vomiting versus some residual bleeding from recently removed gastric polyps earlier that day.  Patient does note that the vomitus always appeared to have some blood in it from the beginning - Continue Protonix drip - GI following, appreciate assistance - Continue trending hemoglobin  Acute blood loss anemia - see GIB - continue trending H/H  Mild intermittent asthma - resume Singulair at discharge  Allergic rhinitis - Resume Singulair at discharge  Hypokalemia - Presumed from GI losses from vomiting - Replete and recheck as needed  Hypothyroidism - Continue Synthroid  Hyperlipidemia - Continue statin  Essential hypertension - Blood pressure has improved since admission - Will resume lisinopril  Leukocytosis-resolved as of 06/28/2021 - likely reactive; improved      Old records reviewed in assessment of this patient  Antimicrobials:   DVT prophylaxis: SCDs Start: 06/27/21 2228   Code Status:   Code Status: Full Code Family Communication:   Disposition Plan: Status is: Observation  The patient remains OBS appropriate and will d/c before 2 midnights.  Dispo: The patient is from: Home              Anticipated d/c is to: Home              Patient currently is not medically stable to d/c.   Difficult to place patient No  Risk of unplanned readmission score:     Objective: Blood pressure (!) 157/95, pulse 72, temperature 98.4 F (36.9 C), temperature source Oral, resp. rate 12, weight 57.9 kg, last menstrual period 07/20/2018, SpO2 100 %.  Examination: General appearance: alert, cooperative, and no distress Head: Normocephalic, without obvious abnormality, atraumatic Eyes:  EOMI Lungs: clear to auscultation bilaterally Heart: regular rate and rhythm and S1, S2 normal Abdomen: normal findings: bowel sounds normal and soft, non-tender Extremities:  no edema Skin: mobility and turgor normal Neurologic: Grossly normal  Consultants:  GI  Procedures:    Data Reviewed: I have personally reviewed following labs and imaging studies Results for orders placed or performed during the hospital encounter of 06/27/21 (from the past 24 hour(s))  Comprehensive metabolic panel     Status: Abnormal   Collection Time: 06/27/21  8:55 PM  Result Value Ref Range   Sodium 141 135 - 145 mmol/L   Potassium 3.2 (L) 3.5 - 5.1 mmol/L   Chloride 107 98 - 111 mmol/L   CO2 26 22 -  32 mmol/L   Glucose, Bld 141 (H) 70 - 99 mg/dL   BUN 30 (H) 6 - 20 mg/dL   Creatinine, Ser 0.85 0.44 - 1.00 mg/dL   Calcium 8.9 8.9 - 10.3 mg/dL   Total Protein 6.5 6.5 - 8.1 g/dL   Albumin 3.8 3.5 - 5.0 g/dL   AST 20 15 - 41 U/L   ALT 20 0 - 44 U/L   Alkaline Phosphatase 39 38 - 126 U/L   Total Bilirubin 1.0 0.3 - 1.2 mg/dL   GFR, Estimated >60 >60 mL/min   Anion gap  8 5 - 15  CBC     Status: Abnormal   Collection Time: 06/27/21  8:55 PM  Result Value Ref Range   WBC 11.5 (H) 4.0 - 10.5 K/uL   RBC 4.17 3.87 - 5.11 MIL/uL   Hemoglobin 13.0 12.0 - 15.0 g/dL   HCT 38.7 36.0 - 46.0 %   MCV 92.8 80.0 - 100.0 fL   MCH 31.2 26.0 - 34.0 pg   MCHC 33.6 30.0 - 36.0 g/dL   RDW 13.0 11.5 - 15.5 %   Platelets 258 150 - 400 K/uL   nRBC 0.0 0.0 - 0.2 %  Type and screen [place]     Status: None (Preliminary result)   Collection Time: 06/27/21  8:55 PM  Result Value Ref Range   ABO/RH(D) O POS    Antibody Screen NEG    Sample Expiration 06/30/2021,2359    Unit Number [id number]    Blood Component Type RED CELLS,LR    Unit division 00    Status of Unit ISSUED    Transfusion Status OK TO TRANSFUSE    Crossmatch Result Compatible    Unit Number [id number]    Blood Component Type RED CELLS,LR    Unit division 00    Status of Unit ISSUED    Transfusion Status OK TO TRANSFUSE    Crossmatch Result      Compatible Performed at [facility], [place] [address], [place], [place] [address]   Protime-INR     Status: None   Collection Time: 06/27/21  8:55 PM  Result Value Ref Range   Prothrombin Time 13.6 11.4 - 15.2 seconds   INR 1.0 0.8 - 1.2  APTT     Status: None   Collection Time: 06/27/21  8:55 PM  Result Value Ref Range   aPTT 27 24 - 36 seconds  Magnesium     Status: None   Collection Time: 06/27/21  8:55 PM  Result Value Ref Range   Magnesium 1.8 1.7 - 2.4 mg/dL  Resp Panel by RT-PCR (Flu A&B, Covid) Nasopharyngeal Swab     Status: None   Collection Time: 06/27/21  9:02 PM   Specimen: Nasopharyngeal Swab; Nasopharyngeal(NP) swabs in vial transport medium  Result Value Ref Range   SARS Coronavirus 2 by RT PCR NEGATIVE NEGATIVE   Influenza A by PCR NEGATIVE NEGATIVE   Influenza B by PCR NEGATIVE NEGATIVE  Hemoglobin and hematocrit, blood     Status: Abnormal   Collection Time: 06/27/21 10:32 PM  Result  Value Ref Range   Hemoglobin 11.2 (L) 12.0 - 15.0 g/dL   HCT 33.0 (L) 36.0 - 46.0 %  Prepare RBC (crossmatch)     Status: None   Collection Time: 06/27/21 10:43 PM  Result Value Ref Range   Order Confirmation      ORDER PROCESSED BY BLOOD BANK Performed at [facility], [address] [place], [place] [address]   MRSA Next Gen by PCR, Nasal     Status: None   Collection Time: 06/27/21 11:52 PM   Specimen: Nasal Mucosa; Nasal Swab  Result Value Ref Range   MRSA by PCR Next Gen NOT DETECTED NOT DETECTED  Protime-INR     Status: None   Collection Time: 06/28/21  1:27 AM  Result Value Ref Range   Prothrombin Time 13.5 11.4 - 15.2 seconds   INR 1.0 0.8 - 1.2  Magnesium     Status: Abnormal   Collection Time: 06/28/21  1:27 AM  Result Value Ref Range   Magnesium 1.6 (L) 1.7 - 2.4 mg/dL  Comprehensive metabolic panel     Status: Abnormal   Collection Time: 06/28/21  1:27 AM  Result Value Ref Range   Sodium 141 135 - 145 mmol/L   Potassium 3.6 3.5 - 5.1 mmol/L   Chloride 109 98 - 111 mmol/L   CO2 24 22 - 32 mmol/L   Glucose, Bld 107 (H) 70 - 99 mg/dL   BUN 31 (H) 6 - 20 mg/dL   Creatinine, Ser 0.71 0.44 - 1.00 mg/dL   Calcium 8.0 (L) 8.9 - 10.3 mg/dL   Total Protein 5.7 (L) 6.5 - 8.1 g/dL   Albumin 3.4 (L) 3.5 - 5.0 g/dL   AST 21 15 - 41 U/L   ALT 17 0 - 44 U/L   Alkaline Phosphatase 33 (L) 38 - 126 U/L   Total Bilirubin 1.1 0.3 - 1.2 mg/dL   GFR, Estimated >60 >60 mL/min   Anion gap 8 5 - 15  CBC     Status: Abnormal   Collection Time: 06/28/21  1:27 AM  Result Value Ref Range   WBC 9.6 4.0 - 10.5 K/uL   RBC 3.64 (L) 3.87 - 5.11 MIL/uL   Hemoglobin 11.5 (L) 12.0 - 15.0 g/dL   HCT 33.8 (L) 36.0 - 46.0 %   MCV 92.9 80.0 - 100.0 fL   MCH 31.6 26.0 - 34.0 pg   MCHC 34.0 30.0 - 36.0 g/dL   RDW 13.2 11.5 - 15.5 %   Platelets 180 150 - 400 K/uL   nRBC 0.0 0.0 - 0.2 %    Recent Results (from the past 240 hour(s))  Resp Panel by RT-PCR (Flu A&B, Covid)  Nasopharyngeal Swab     Status: None   Collection Time: 06/27/21  9:02 PM   Specimen: Nasopharyngeal Swab; Nasopharyngeal(NP) swabs in vial transport medium  Result Value Ref Range Status   SARS Coronavirus 2 by RT PCR NEGATIVE NEGATIVE Final    Comment: (NOTE) SARS-CoV-2 target nucleic acids are NOT DETECTED.  The SARS-CoV-2 RNA is generally detectable in upper respiratory specimens during the acute phase of infection. The lowest concentration of SARS-CoV-2 viral copies this assay can detect is 138 copies/mL. A negative result does not preclude SARS-Cov-2 infection and should not be used as the sole basis for treatment or other patient management decisions. A negative result may occur with  improper specimen collection/handling, submission of specimen other than nasopharyngeal swab, presence of viral mutation(s) within the areas targeted by this assay, and inadequate number of viral copies(<138 copies/mL). A negative result must be combined with clinical observations, patient history, and epidemiological information. The expected result is Negative.  Fact Sheet for Patients:  EntrepreneurPulse.com.au  Fact Sheet for Healthcare Providers:  IncredibleEmployment.be  This test is no t yet approved or cleared by the [place] FDA and  has been  authorized for detection and/or diagnosis of SARS-CoV-2 by FDA under an Emergency Use Authorization (EUA). This EUA will remain  in effect (meaning this test can be used) for the duration of the COVID-19 declaration under Section 564(b)(1) of the Act, 21 U.S.C.section 360bbb-3(b)(1), unless the authorization is terminated  or revoked sooner.       Influenza A by PCR NEGATIVE NEGATIVE Final   Influenza B by PCR NEGATIVE NEGATIVE Final    Comment: (NOTE) The Xpert Xpress SARS-CoV-2/FLU/RSV plus assay is intended as an aid in the diagnosis of influenza from Nasopharyngeal swab specimens and should not be  used as a sole basis for treatment. Nasal washings and aspirates are unacceptable for Xpert Xpress SARS-CoV-2/FLU/RSV testing.  Fact Sheet for Patients: EntrepreneurPulse.com.au  Fact Sheet for Healthcare Providers: IncredibleEmployment.be  This test is not yet approved or cleared by the [place] FDA and has been authorized for detection and/or diagnosis of SARS-CoV-2 by FDA under an Emergency Use Authorization (EUA). This EUA will remain in effect (meaning this test can be used) for the duration of the COVID-19 declaration under Section 564(b)(1) of the Act, 21 U.S.C. section 360bbb-3(b)(1), unless the authorization is terminated or revoked.  Performed at [facility], [place] [address], [place], [place] [address]   MRSA Next Gen by PCR, Nasal     Status: None   Collection Time: 06/27/21 11:52 PM   Specimen: Nasal Mucosa; Nasal Swab  Result Value Ref Range Status   MRSA by PCR Next Gen NOT DETECTED NOT DETECTED Final    Comment: (NOTE) The GeneXpert MRSA Assay (FDA approved for NASAL specimens only), is one component of a comprehensive MRSA colonization surveillance program. It is not intended to diagnose MRSA infection nor to guide or monitor treatment for MRSA infections. Test performance is not FDA approved in patients less than 62 years old. Performed at [facility], [place] [address], [place], [place] [address]      Radiology Studies: DG Chest Portable 1 View  Result Date: 06/27/2021 CLINICAL DATA:  GI bleed. EXAM: PORTABLE CHEST 1 VIEW COMPARISON:  Chest radiograph dated 06/12/2021. FINDINGS: No focal consolidation, pleural effusion, or pneumothorax. The cardiac silhouette is within normal limits. No acute osseous pathology. IMPRESSION: No active disease. Electronically Signed   By: Anner Crete M.D.   On: 06/27/2021 21:31   DG Chest Portable 1 View  Final Result      Scheduled Meds:   sodium chloride   Intravenous Once   atorvastatin  20 mg Oral QHS   Chlorhexidine Gluconate Cloth  6 each Topical Daily   [START ON 06/29/2021] levothyroxine  50 mcg Oral QAC breakfast   lisinopril  5 mg Oral Daily   PRN Meds: sodium chloride, acetaminophen **OR** acetaminophen, ALPRAZolam, ondansetron (ZOFRAN) IV, promethazine (PHENERGAN) injection (IM or IVPB) Continuous Infusions:  sodium chloride 125 mL/hr at 06/28/21 1100   sodium chloride Stopped (06/28/21 1045)   pantoprazole 8 mg/hr (06/28/21 1100)   promethazine (PHENERGAN) injection (IM or IVPB) Stopped (06/28/21 0033)     LOS: 0 days  Time spent: Greater than 50% of the 35 minute visit was spent in counseling/coordination of care for the patient as laid out in the A&P.   Dwyane Dee, MD [facility] 06/28/2021, 12:35 PM

## 2021-06-28 NOTE — Progress Notes (Signed)
Responded to consult for IV. RN reports IV obtained. Consult cleared.

## 2021-06-28 NOTE — Assessment & Plan Note (Addendum)
-   Unclear etiology.  Possibly due to Mallory-Weiss tear from vomiting versus some residual bleeding from recently removed gastric polyps earlier that day.  Patient does note that the vomitus always appeared to have some blood in it from the beginning - Continue Protonix; transitioned off drip to oral -Continue Magic mouthwash as well -Soft diet started on 06/29/2021.  Follow-up tolerance -Hemoglobin stable, 11.9 g/dL.  Patient having some dark bowel movements on 9/3 and extremely nervous on going home.  This is likely resolving GI bleed but given patient anxiety with situation we observed her 1 more night; Hgb remained stable; she was again reassured that bowel movements was slowly transition back to normal.  Hemoglobin 11.9 g/dL at discharge - She will follow-up outpatient with GI as well -Continued on twice daily Nexium at discharge

## 2021-06-28 NOTE — Hospital Course (Addendum)
Sherri Ruiz is a 60 yo female with PMH eosinophilic esophagitis, GERD, hypertension who presented to the ER after developing episodes of hematemesis at home. She had recently undergone EGD on 06/27/2021 and underwent dilation of proximal and distal esophageal circular rings.  She also had gastric polyps which were removed by cold snare. She was evaluated in the ER and admitted for further monitoring with trending of hemoglobin.  She was initiated on a Protonix drip as well. Hemoglobin on admission was noted to be 13 g/dL.  See below for further A&P

## 2021-06-28 NOTE — Assessment & Plan Note (Signed)
Continue Synthroid °

## 2021-06-29 DIAGNOSIS — R1319 Other dysphagia: Secondary | ICD-10-CM | POA: Diagnosis not present

## 2021-06-29 DIAGNOSIS — D62 Acute posthemorrhagic anemia: Secondary | ICD-10-CM | POA: Diagnosis not present

## 2021-06-29 DIAGNOSIS — R1013 Epigastric pain: Secondary | ICD-10-CM | POA: Diagnosis not present

## 2021-06-29 DIAGNOSIS — K921 Melena: Secondary | ICD-10-CM

## 2021-06-29 DIAGNOSIS — K922 Gastrointestinal hemorrhage, unspecified: Secondary | ICD-10-CM | POA: Diagnosis not present

## 2021-06-29 LAB — HIV ANTIBODY (ROUTINE TESTING W REFLEX): HIV Screen 4th Generation wRfx: NONREACTIVE

## 2021-06-29 LAB — CBC WITH DIFFERENTIAL/PLATELET
Abs Immature Granulocytes: 0.01 10*3/uL (ref 0.00–0.07)
Basophils Absolute: 0 10*3/uL (ref 0.0–0.1)
Basophils Relative: 1 %
Eosinophils Absolute: 0.1 10*3/uL (ref 0.0–0.5)
Eosinophils Relative: 2 %
HCT: 35.3 % — ABNORMAL LOW (ref 36.0–46.0)
Hemoglobin: 11.9 g/dL — ABNORMAL LOW (ref 12.0–15.0)
Immature Granulocytes: 0 %
Lymphocytes Relative: 40 %
Lymphs Abs: 2 10*3/uL (ref 0.7–4.0)
MCH: 29.8 pg (ref 26.0–34.0)
MCHC: 33.7 g/dL (ref 30.0–36.0)
MCV: 88.5 fL (ref 80.0–100.0)
Monocytes Absolute: 0.3 10*3/uL (ref 0.1–1.0)
Monocytes Relative: 7 %
Neutro Abs: 2.5 10*3/uL (ref 1.7–7.7)
Neutrophils Relative %: 50 %
Platelets: 174 10*3/uL (ref 150–400)
RBC: 3.99 MIL/uL (ref 3.87–5.11)
RDW: 15.4 % (ref 11.5–15.5)
WBC: 4.9 10*3/uL (ref 4.0–10.5)
nRBC: 0 % (ref 0.0–0.2)

## 2021-06-29 LAB — BASIC METABOLIC PANEL
Anion gap: 4 — ABNORMAL LOW (ref 5–15)
BUN: 7 mg/dL (ref 6–20)
CO2: 25 mmol/L (ref 22–32)
Calcium: 7.5 mg/dL — ABNORMAL LOW (ref 8.9–10.3)
Chloride: 110 mmol/L (ref 98–111)
Creatinine, Ser: 0.72 mg/dL (ref 0.44–1.00)
GFR, Estimated: >60 mL/min (ref >60)
Glucose, Bld: 96 mg/dL (ref 70–99)
Potassium: 3.6 mmol/L (ref 3.5–5.1)
Sodium: 139 mmol/L (ref 135–145)

## 2021-06-29 LAB — MAGNESIUM: Magnesium: 2.3 mg/dL (ref 1.7–2.4)

## 2021-06-29 MED ORDER — HYDROXYZINE HCL 25 MG PO TABS
25.0000 mg | ORAL_TABLET | Freq: Three times a day (TID) | ORAL | Status: DC | PRN
  Administered 2021-06-29: 25 mg via ORAL
  Filled 2021-06-29: qty 1

## 2021-06-29 MED ORDER — PANTOPRAZOLE SODIUM 40 MG PO TBEC
40.0000 mg | DELAYED_RELEASE_TABLET | Freq: Two times a day (BID) | ORAL | Status: DC
  Administered 2021-06-29 – 2021-06-30 (×2): 40 mg via ORAL
  Filled 2021-06-29 (×2): qty 1

## 2021-06-29 MED ORDER — CALCIUM CITRATE 950 (200 CA) MG PO TABS
200.0000 mg | ORAL_TABLET | Freq: Every day | ORAL | Status: DC
  Administered 2021-06-29 – 2021-06-30 (×2): 200 mg via ORAL
  Filled 2021-06-29 (×2): qty 1

## 2021-06-29 NOTE — Progress Notes (Signed)
Progress Note    Oria Mayernik   [id number]  DOB: 1960/10/28  DOA: 06/27/2021     0  PCP: Nicoletta Dress, MD  Initial CC: hematemesis  Hospital Course: Ms. Faustini is a 60 yo female with PMH eosinophilic esophagitis, GERD, hypertension who presented to the ER after developing episodes of hematemesis at home. She had recently undergone EGD on 06/27/2021 and underwent dilation of proximal and distal esophageal circular rings.  She also had gastric polyps which were removed by cold snare. She was evaluated in the ER and admitted for further monitoring with trending of hemoglobin.  She was initiated on a Protonix drip as well. Hemoglobin on admission was noted to be 13 g/dL.  Interval History:  Some nausea but still no vomiting.  She is rather nervous on trying to eat her solid diet.  Also started having some dark stools later this morning and made her more anxious about going home.  We will observe 1 more night for further reassurance to the patient.  ROS: Constitutional: negative for chills and fevers, Respiratory: negative for cough and sputum, Cardiovascular: negative for chest pain, and Gastrointestinal: negative for abdominal pain  Assessment & Plan: * Acute upper GI bleed - Unclear etiology.  Possibly due to Mallory-Weiss tear from vomiting versus some residual bleeding from recently removed gastric polyps earlier that day.  Patient does note that the vomitus always appeared to have some blood in it from the beginning - Continue Protonix; transitioned off drip to oral -Continue Magic mouthwash as well -Soft diet started on 06/29/2021.  Follow-up tolerance -Hemoglobin stable, 11.9 g/dL.  Patient having some dark bowel movements today and extremely nervous on going home.  This is likely resolving GI bleed but given patient anxiety with situation, will observe 1 more night and target discharge tomorrow  Acute blood loss anemia - see GIB - continue trending H/H  Mild intermittent  asthma - resume Singulair at discharge  Allergic rhinitis - Resume Singulair at discharge  Hypokalemia - Presumed from GI losses from vomiting - Replete and recheck as needed  Hypothyroidism - Continue Synthroid  Hyperlipidemia - Continue statin  Essential hypertension - Blood pressure has improved since admission - Will resume lisinopril  Leukocytosis-resolved as of 06/28/2021 - likely reactive; improved    Old records reviewed in assessment of this patient  Antimicrobials:   DVT prophylaxis: SCDs Start: 06/27/21 2228   Code Status:   Code Status: Full Code Family Communication:   Disposition Plan: Status is: Observation  The patient remains OBS appropriate and will d/c before 2 midnights.  Dispo: The patient is from: Home              Anticipated d/c is to: Home              Patient currently is not medically stable to d/c.   Difficult to place patient No  Risk of unplanned readmission score:     Objective: Blood pressure (!) 145/97, pulse 76, temperature (!) 97.5 F (36.4 C), temperature source Axillary, resp. rate 19, weight 59.5 kg, last menstrual period 07/20/2018, SpO2 99 %.  Examination: General appearance: alert, cooperative, and no distress Head: Normocephalic, without obvious abnormality, atraumatic Eyes:  EOMI Lungs: clear to auscultation bilaterally Heart: regular rate and rhythm and S1, S2 normal Abdomen: normal findings: bowel sounds normal and soft, non-tender Extremities:  no edema Skin: mobility and turgor normal Neurologic: Grossly normal  Consultants:  GI  Procedures:    Data Reviewed: I have personally  reviewed following labs and imaging studies Results for orders placed or performed during the hospital encounter of 06/27/21 (from the past 24 hour(s))  Hemoglobin and hematocrit, blood     Status: None   Collection Time: 06/28/21  6:16 PM  Result Value Ref Range   Hemoglobin 12.8 12.0 - 15.0 g/dL   HCT 37.6 36.0 - 46.0 %  HIV  Antibody (routine testing w rflx)     Status: None   Collection Time: 06/29/21  1:56 AM  Result Value Ref Range   HIV Screen 4th Generation wRfx Non Reactive Non Reactive  Basic metabolic panel     Status: Abnormal   Collection Time: 06/29/21  1:56 AM  Result Value Ref Range   Sodium 139 135 - 145 mmol/L   Potassium 3.6 3.5 - 5.1 mmol/L   Chloride 110 98 - 111 mmol/L   CO2 25 22 - 32 mmol/L   Glucose, Bld 96 70 - 99 mg/dL   BUN 7 6 - 20 mg/dL   Creatinine, Ser 0.72 0.44 - 1.00 mg/dL   Calcium 7.5 (L) 8.9 - 10.3 mg/dL   GFR, Estimated >60 >60 mL/min   Anion gap 4 (L) 5 - 15  CBC with Differential/Platelet     Status: Abnormal   Collection Time: 06/29/21  1:56 AM  Result Value Ref Range   WBC 4.9 4.0 - 10.5 K/uL   RBC 3.99 3.87 - 5.11 MIL/uL   Hemoglobin 11.9 (L) 12.0 - 15.0 g/dL   HCT 35.3 (L) 36.0 - 46.0 %   MCV 88.5 80.0 - 100.0 fL   MCH 29.8 26.0 - 34.0 pg   MCHC 33.7 30.0 - 36.0 g/dL   RDW 15.4 11.5 - 15.5 %   Platelets 174 150 - 400 K/uL   nRBC 0.0 0.0 - 0.2 %   Neutrophils Relative % 50 %   Neutro Abs 2.5 1.7 - 7.7 K/uL   Lymphocytes Relative 40 %   Lymphs Abs 2.0 0.7 - 4.0 K/uL   Monocytes Relative 7 %   Monocytes Absolute 0.3 0.1 - 1.0 K/uL   Eosinophils Relative 2 %   Eosinophils Absolute 0.1 0.0 - 0.5 K/uL   Basophils Relative 1 %   Basophils Absolute 0.0 0.0 - 0.1 K/uL   Immature Granulocytes 0 %   Abs Immature Granulocytes 0.01 0.00 - 0.07 K/uL  Magnesium     Status: None   Collection Time: 06/29/21  1:56 AM  Result Value Ref Range   Magnesium 2.3 1.7 - 2.4 mg/dL    Recent Results (from the past 240 hour(s))  Resp Panel by RT-PCR (Flu A&B, Covid) Nasopharyngeal Swab     Status: None   Collection Time: 06/27/21  9:02 PM   Specimen: Nasopharyngeal Swab; Nasopharyngeal(NP) swabs in vial transport medium  Result Value Ref Range Status   SARS Coronavirus 2 by RT PCR NEGATIVE NEGATIVE Final    Comment: (NOTE) SARS-CoV-2 target nucleic acids are NOT  DETECTED.  The SARS-CoV-2 RNA is generally detectable in upper respiratory specimens during the acute phase of infection. The lowest concentration of SARS-CoV-2 viral copies this assay can detect is 138 copies/mL. A negative result does not preclude SARS-Cov-2 infection and should not be used as the sole basis for treatment or other patient management decisions. A negative result may occur with  improper specimen collection/handling, submission of specimen other than nasopharyngeal swab, presence of viral mutation(s) within the areas targeted by this assay, and inadequate number of viral copies(<138 copies/mL). A negative  result must be combined with clinical observations, patient history, and epidemiological information. The expected result is Negative.  Fact Sheet for Patients:  EntrepreneurPulse.com.au  Fact Sheet for Healthcare Providers:  IncredibleEmployment.be  This test is no t yet approved or cleared by the [place] FDA and  has been authorized for detection and/or diagnosis of SARS-CoV-2 by FDA under an Emergency Use Authorization (EUA). This EUA will remain  in effect (meaning this test can be used) for the duration of the COVID-19 declaration under Section 564(b)(1) of the Act, 21 U.S.C.section 360bbb-3(b)(1), unless the authorization is terminated  or revoked sooner.       Influenza A by PCR NEGATIVE NEGATIVE Final   Influenza B by PCR NEGATIVE NEGATIVE Final    Comment: (NOTE) The Xpert Xpress SARS-CoV-2/FLU/RSV plus assay is intended as an aid in the diagnosis of influenza from Nasopharyngeal swab specimens and should not be used as a sole basis for treatment. Nasal washings and aspirates are unacceptable for Xpert Xpress SARS-CoV-2/FLU/RSV testing.  Fact Sheet for Patients: EntrepreneurPulse.com.au  Fact Sheet for Healthcare Providers: IncredibleEmployment.be  This test is not yet  approved or cleared by the [place] FDA and has been authorized for detection and/or diagnosis of SARS-CoV-2 by FDA under an Emergency Use Authorization (EUA). This EUA will remain in effect (meaning this test can be used) for the duration of the COVID-19 declaration under Section 564(b)(1) of the Act, 21 U.S.C. section 360bbb-3(b)(1), unless the authorization is terminated or revoked.  Performed at [facility], [place] [address] [place], [place] [address]   MRSA Next Gen by PCR, Nasal     Status: None   Collection Time: 06/27/21 11:52 PM   Specimen: Nasal Mucosa; Nasal Swab  Result Value Ref Range Status   MRSA by PCR Next Gen NOT DETECTED NOT DETECTED Final    Comment: (NOTE) The GeneXpert MRSA Assay (FDA approved for NASAL specimens only), is one component of a comprehensive MRSA colonization surveillance program. It is not intended to diagnose MRSA infection nor to guide or monitor treatment for MRSA infections. Test performance is not FDA approved in patients less than 30 years old. Performed at [facility], [place] [address] [place], [place] [address]      Radiology Studies: DG Chest Portable 1 View  Result Date: 06/27/2021 CLINICAL DATA:  GI bleed. EXAM: PORTABLE CHEST 1 VIEW COMPARISON:  Chest radiograph dated 06/12/2021. FINDINGS: No focal consolidation, pleural effusion, or pneumothorax. The cardiac silhouette is within normal limits. No acute osseous pathology. IMPRESSION: No active disease. Electronically Signed   By: Anner Crete M.D.   On: 06/27/2021 21:31   DG Chest Portable 1 View  Final Result      Scheduled Meds:  sodium chloride   Intravenous Once   atorvastatin  20 mg Oral QHS   calcium citrate  200 mg of elemental calcium Oral Daily   Chlorhexidine Gluconate Cloth  6 each Topical Daily   levothyroxine  50 mcg Oral QAC breakfast   lisinopril  5 mg Oral Daily   pantoprazole  40 mg Oral BID AC   PRN Meds:  sodium chloride, acetaminophen **OR** acetaminophen, hydrALAZINE, hydrOXYzine, labetalol, magic mouthwash w/lidocaine, ondansetron (ZOFRAN) IV, promethazine (PHENERGAN) injection (IM or IVPB) Continuous Infusions:  sodium chloride Stopped (06/28/21 1241)   promethazine (PHENERGAN) injection (IM or IVPB) Stopped (06/28/21 0033)     LOS: 0 days  Time spent: Greater than 50% of the 35 minute visit was spent in counseling/coordination of care  for the patient as laid out in the A&P.   Dwyane Dee, MD [facility] 06/29/2021, 12:00 PM

## 2021-06-29 NOTE — Progress Notes (Signed)
Patient asleep at this time bed received from [facility]. Family requesting that patient get rest. Will transport patient upstairs when she wakes up.

## 2021-06-29 NOTE — Progress Notes (Signed)
GI Progress Note  Chief Complaint: Melena  History:  Sherri Ruiz has nausea and epigastric pain that she feels is similar to yesterday.  Denies chest pain or dyspnea.  So far is only had clear liquids but keeping that down with the use of antiemetic medicine.  No black or bloody stool over 24 hours, in fact says she feels constipated.  ROS: No dysuria No cough or sputum production Objective:   Current Facility-Administered Medications:    0.9 %  sodium chloride infusion (Manually program via Guardrails IV Fluids), , Intravenous, Once, Howerter, Justin B, DO, Held at 06/27/21 2308   [COMPLETED] sodium chloride 0.9 % bolus 500 mL, 500 mL, Intravenous, Once, Stopped at 06/27/21 2139 **FOLLOWED BY** 0.9 %  sodium chloride infusion, 1,000 mL, Intravenous, Continuous, Howerter, Justin B, DO, Last Rate: 125 mL/hr at 06/29/21 0033, 1,000 mL at 06/29/21 0033   0.9 %  sodium chloride infusion, , Intravenous, PRN, Dwyane Dee, MD, Stopped at 06/28/21 1241   acetaminophen (TYLENOL) tablet 650 mg, 650 mg, Oral, Q6H PRN **OR** acetaminophen (TYLENOL) suppository 650 mg, 650 mg, Rectal, Q6H PRN, Howerter, Justin B, DO   atorvastatin (LIPITOR) tablet 20 mg, 20 mg, Oral, QHS, Dwyane Dee, MD, 20 mg at 06/28/21 2107   calcium citrate (CALCITRATE - dosed in mg elemental calcium) tablet 200 mg of elemental calcium, 200 mg of elemental calcium, Oral, Daily, Blount, Scarlette Shorts T, NP   Chlorhexidine Gluconate Cloth 2 % PADS 6 each, 6 each, Topical, Daily, Girguis, David, MD   hydrALAZINE (APRESOLINE) injection 10 mg, 10 mg, Intravenous, Q4H PRN, Dwyane Dee, MD   labetalol (NORMODYNE) injection 10 mg, 10 mg, Intravenous, Q4H PRN, Dwyane Dee, MD   levothyroxine (SYNTHROID) tablet 50 mcg, 50 mcg, Oral, QAC breakfast, Dwyane Dee, MD, 50 mcg at 06/29/21 0554   lisinopril (ZESTRIL) tablet 5 mg, 5 mg, Oral, Daily, Dwyane Dee, MD   magic mouthwash w/lidocaine, 5 mL, Oral, TID PRN, Willia Craze,  NP, 5 mL at 06/29/21 0800   ondansetron (ZOFRAN) injection 4 mg, 4 mg, Intravenous, Q6H PRN, Howerter, Justin B, DO, 4 mg at 06/29/21 0800   pantoprozole (PROTONIX) 80 mg /NS 100 mL infusion, 8 mg/hr, Intravenous, Continuous, Howerter, Justin B, DO, Last Rate: 10 mL/hr at 06/29/21 0441, 8 mg/hr at 06/29/21 0441   promethazine (PHENERGAN) 12.5 mg in sodium chloride 0.9 % 50 mL IVPB, 12.5 mg, Intravenous, Q6H PRN, Jackquline Denmark, MD, Stopped at 06/28/21 0033   sodium chloride 1,000 mL (06/29/21 0033)   sodium chloride Stopped (06/28/21 1241)   pantoprazole 8 mg/hr (06/29/21 0441)   promethazine (PHENERGAN) injection (IM or IVPB) Stopped (06/28/21 0033)     Vital signs in last 24 hrs: Vitals:   06/29/21 0500 06/29/21 0600  BP: (!) 129/94 127/75  Pulse: 62 71  Resp: 11 10  Temp:    SpO2: 97% 98%    Intake/Output Summary (Last 24 hours) at 06/29/2021 0849 Last data filed at 06/29/2021 0000 Gross per 24 hour  Intake 2227.12 ml  Output --  Net 2227.12 ml     Physical Exam Sitting up comfortably in bed, no acute distress, pleasant and conversational HEENT: sclera anicteric, oral mucosa without lesions Neck: supple, no thyromegaly, JVD or lymphadenopathy.  No crepitus Cardiac: RRR without murmurs, S1S2 heard, no peripheral edema Pulm: clear to auscultation bilaterally, normal RR and effort noted Abdomen: soft, mild epigastric tenderness, with active bowel sounds. No guarding or palpable hepatosplenomegaly Skin; warm and dry, no jaundice  Recent Labs:  CBC Latest Ref Rng & Units 06/29/2021 06/28/2021 06/28/2021  WBC 4.0 - 10.5 K/uL 4.9 - -  Hemoglobin 12.0 - 15.0 g/dL 11.9(L) 12.8 15.4(H)  Hematocrit 36.0 - 46.0 % 35.3(L) 37.6 45.4  Platelets 150 - 400 K/uL 174 - -    Recent Labs  Lab 06/28/21 0127  INR 1.0   CMP Latest Ref Rng & Units 06/29/2021 06/28/2021 06/27/2021  Glucose 70 - 99 mg/dL 96 107(H) 141(H)  BUN 6 - 20 mg/dL 7 31(H) 30(H)  Creatinine 0.44 - 1.00 mg/dL 0.72 0.71 0.85   Sodium 135 - 145 mmol/L 139 141 141  Potassium 3.5 - 5.1 mmol/L 3.6 3.6 3.2(L)  Chloride 98 - 111 mmol/L 110 109 107  CO2 22 - 32 mmol/L '25 24 26  '$ Calcium 8.9 - 10.3 mg/dL 7.5(L) 8.0(L) 8.9  Total Protein 6.5 - 8.1 g/dL - 5.7(L) 6.5  Total Bilirubin 0.3 - 1.2 mg/dL - 1.1 1.0  Alkaline Phos 38 - 126 U/L - 33(L) 39  AST 15 - 41 U/L - 21 20  ALT 0 - 44 U/L - 17 20     Radiologic studies:   Assessment & Plan  Assessment: Melena Acute blood loss anemia. Esophageal dysphagia and underlying eosinophilic esophagitis, status post bougie dilation of esophagus. Gastric polyps, status post cold snare resection of several during recent outpatient EGD  Unclear if bleeding was from esophageal polypectomy or esophageal wrent(s) from bougie dilation, but suspect latter is more likely. Her bleeding has stopped, hemoglobin equilibrating after initial transfusion and volume shifts. No mediastinal air on chest x-ray, vital signs stable, no fever or leukocytosis.  She is only had clear liquids so far, so I ordered a soft diet for her to have this morning.   Plan: If she can keep down some soft food and liquids, and nausea and pain are adequately controlled on medications, my opinion is she can be discharged later today on the following:  Pantoprazole 40 mg by mouth twice daily Magic mouthwash 5 mL 3 times daily as needed for odynophagia I will message Dr. Lyndel Safe to arrange outpatient follow-up Return to ED if needed for worsening symptoms.   25 minutes were spent on this encounter (including chart review, history/exam, counseling/coordination of care, and documentation) > 50% of that time was spent on counseling and coordination of care.  Nelida Meuse III Office: [phone]

## 2021-06-30 DIAGNOSIS — K200 Eosinophilic esophagitis: Secondary | ICD-10-CM

## 2021-06-30 DIAGNOSIS — K922 Gastrointestinal hemorrhage, unspecified: Secondary | ICD-10-CM | POA: Diagnosis not present

## 2021-06-30 LAB — CBC WITH DIFFERENTIAL/PLATELET
Abs Immature Granulocytes: 0.02 10*3/uL (ref 0.00–0.07)
Basophils Absolute: 0 10*3/uL (ref 0.0–0.1)
Basophils Relative: 1 %
Eosinophils Absolute: 0.1 10*3/uL (ref 0.0–0.5)
Eosinophils Relative: 2 %
HCT: 35.2 % — ABNORMAL LOW (ref 36.0–46.0)
Hemoglobin: 11.9 g/dL — ABNORMAL LOW (ref 12.0–15.0)
Immature Granulocytes: 1 %
Lymphocytes Relative: 44 %
Lymphs Abs: 2 10*3/uL (ref 0.7–4.0)
MCH: 29.8 pg (ref 26.0–34.0)
MCHC: 33.8 g/dL (ref 30.0–36.0)
MCV: 88.2 fL (ref 80.0–100.0)
Monocytes Absolute: 0.3 10*3/uL (ref 0.1–1.0)
Monocytes Relative: 6 %
Neutro Abs: 2.1 10*3/uL (ref 1.7–7.7)
Neutrophils Relative %: 46 %
Platelets: 165 10*3/uL (ref 150–400)
RBC: 3.99 MIL/uL (ref 3.87–5.11)
RDW: 15.1 % (ref 11.5–15.5)
WBC: 4.4 10*3/uL (ref 4.0–10.5)
nRBC: 0 % (ref 0.0–0.2)

## 2021-06-30 LAB — BASIC METABOLIC PANEL
Anion gap: 4 — ABNORMAL LOW (ref 5–15)
BUN: 7 mg/dL (ref 6–20)
CO2: 26 mmol/L (ref 22–32)
Calcium: 8.4 mg/dL — ABNORMAL LOW (ref 8.9–10.3)
Chloride: 113 mmol/L — ABNORMAL HIGH (ref 98–111)
Creatinine, Ser: 0.75 mg/dL (ref 0.44–1.00)
GFR, Estimated: >60 mL/min (ref >60)
Glucose, Bld: 103 mg/dL — ABNORMAL HIGH (ref 70–99)
Potassium: 3.4 mmol/L — ABNORMAL LOW (ref 3.5–5.1)
Sodium: 143 mmol/L (ref 135–145)

## 2021-06-30 LAB — MAGNESIUM: Magnesium: 1.9 mg/dL (ref 1.7–2.4)

## 2021-06-30 MED ORDER — POTASSIUM CHLORIDE CRYS ER 20 MEQ PO TBCR
40.0000 meq | EXTENDED_RELEASE_TABLET | Freq: Once | ORAL | Status: DC
  Filled 2021-06-30: qty 2

## 2021-06-30 MED ORDER — PROMETHAZINE HCL 25 MG PO TABS: 25.0000 mg | ORAL_TABLET | Freq: Three times a day (TID) | ORAL | 0 refills | Status: DC | PRN

## 2021-06-30 NOTE — Plan of Care (Signed)
Reviewed written d/c instructions w pt and all questions answered. She verbalized understanding. D/C per w/c w all belongings in stable condition. 

## 2021-06-30 NOTE — Discharge Summary (Signed)
Physician Discharge Summary   Sherri Ruiz [id number] DOB: November 02, 1960 DOA: 06/27/2021  PCP: Sherri Dress, MD  Admit date: 06/27/2021 Discharge date:  06/30/2021  Admitted From: home Disposition:  home Discharging physician: Dwyane Dee, MD  Recommendations for Outpatient Follow-up:  Follow up with GI  Home Health:  Equipment/Devices:   Patient discharged to home in Discharge Condition: stable Risk of unplanned readmission score:    CODE STATUS: Full Diet recommendation:  Diet Orders (From admission, onward)     Start     Ordered   06/29/21 0901  DIET SOFT Room service appropriate? Yes; Fluid consistency: Thin  Diet effective now       Question Answer Comment  Room service appropriate? Yes   Fluid consistency: Thin      06/29/21 0900            Hospital Course: [name] is a 60 yo female with PMH eosinophilic esophagitis, GERD, hypertension who presented to the ER after developing episodes of hematemesis at home. She had recently undergone EGD on 06/27/2021 and underwent dilation of proximal and distal esophageal circular rings.  She also had gastric polyps which were removed by cold snare. She was evaluated in the ER and admitted for further monitoring with trending of hemoglobin.  She was initiated on a Protonix drip as well. Hemoglobin on admission was noted to be 13 g/dL.  See below for further A&P  * Acute upper GI bleed - Unclear etiology.  Possibly due to Mallory-Weiss tear from vomiting versus some residual bleeding from recently removed gastric polyps earlier that day.  Patient does note that the vomitus always appeared to have some blood in it from the beginning - Continue Protonix; transitioned off drip to oral -Continue Magic mouthwash as well -Soft diet started on 06/29/2021.  Follow-up tolerance -Hemoglobin stable, 11.9 g/dL.  Patient having some dark bowel movements on 9/3 and extremely nervous on going home.  This is likely resolving GI bleed but  given patient anxiety with situation we observed her 1 more night; Hgb remained stable; she was again reassured that bowel movements was slowly transition back to normal.  Hemoglobin 11.9 g/dL at discharge - She will follow-up outpatient with GI as well -Continued on twice daily Nexium at discharge  Eosinophilic esophagitis - PPI continued at discharge  Acute blood loss anemia - see GIB  Mild intermittent asthma - resume Singulair at discharge  Allergic rhinitis - Resume Singulair at discharge  Hypokalemia - Presumed from GI losses from vomiting - Repleted  Hypothyroidism - Continue Synthroid  Hyperlipidemia - Continue statin  Essential hypertension - Blood pressure has improved since admission - Will resume lisinopril  Leukocytosis-resolved as of 06/28/2021 - likely reactive; improved     The patient's chronic medical conditions were treated accordingly per the patient's home medication regimen except as noted.  On day of discharge, patient was felt deemed stable for discharge. Patient/family member advised to call PCP or come back to ER if needed.   Principal Diagnosis: Acute upper GI bleed  Discharge Diagnoses: Active Hospital Problems   Diagnosis Date Noted   Acute upper GI bleed 06/27/2021    Priority: High   Eosinophilic esophagitis [id number]    Priority: Medium   Acute blood loss anemia 06/28/2021    Priority: Medium   Hypokalemia 06/28/2021   Allergic rhinitis    Mild intermittent asthma    Hyperlipidemia 07/20/2018   Essential hypertension 07/20/2018   Hypothyroidism 07/20/2018    Resolved Hospital Problems  Diagnosis Date Noted Date Resolved   Leukocytosis 06/28/2021 06/28/2021    Discharge Instructions     Increase activity slowly   Complete by: As directed       Allergies as of 06/30/2021       Reactions   Clindamycin/lincomycin Swelling   Meperidine Nausea And Vomiting   unknown   Penicillin G Other (See Comments)    Unknown/childhood allergy Has patient had a PCN reaction causing immediate rash, facial/tongue/throat swelling, SOB or lightheadedness with hypotension: Yes Has patient had a PCN reaction causing severe rash involving mucus membranes or skin necrosis: Unknown Has patient had a PCN reaction that required hospitalization: No Has patient had a PCN reaction occurring within the last 10 years: Unknown If all of the above answers are "NO", then may proceed with Cephalosporin use.   Ativan [lorazepam] Other (See Comments)   Poor clearance, memory loss        Medication List     STOP taking these medications    metoCLOPramide 10 MG tablet Commonly known as: REGLAN   sucralfate 1 GM/10ML suspension Commonly known as: CARAFATE       TAKE these medications    AMBULATORY NON FORMULARY MEDICATION Medication Name: GI Cocktail (Equal amounts of Maalox, Viscous Lidocaine, Bentyl)  Take 10 cc by mouth every 8 hours as needed.   atorvastatin 20 MG tablet Commonly known as: LIPITOR Take 20 mg by mouth at bedtime.   dicyclomine 20 MG tablet Commonly known as: BENTYL Take 1 tablet (20 mg total) by mouth 2 (two) times daily. What changed:  when to take this reasons to take this   DIGESTIVE ADVANTAGE PO Take 1 tablet by mouth daily.   esomeprazole 40 MG capsule Commonly known as: NexIUM Take 1 capsule (40 mg total) by mouth 2 (two) times daily. What changed: Another medication with the same name was removed. Continue taking this medication, and follow the directions you see here.   GAS-X PO Take 1 tablet by mouth 3 (three) times daily as needed.   gi cocktail Susp suspension Take 30 mLs by mouth 2 (two) times daily. Shake well.   HAIR SKIN AND NAILS FORMULA PO Take 1 tablet by mouth daily.   levothyroxine 50 MCG tablet Commonly known as: SYNTHROID Take 50 mcg by mouth daily before breakfast.   lisinopril 5 MG tablet Commonly known as: ZESTRIL Take 5 mg by mouth daily.    Minivelle 0.1 MG/24HR patch Generic drug: estradiol Place 1 patch onto the skin once a week. Friday   montelukast 10 MG tablet Commonly known as: SINGULAIR TAKE 1 TABLET BY MOUTH EVERY DAY AS DIRECTED What changed: when to take this   MULTIVITAMIN PO Take 1 tablet by mouth daily.   promethazine 25 MG tablet Commonly known as: PHENERGAN Take 1 tablet (25 mg total) by mouth every 8 (eight) hours as needed for nausea.   ZYRTEC PO Take 1 tablet by mouth daily.        Allergies  Allergen Reactions   Clindamycin/Lincomycin Swelling   Meperidine Nausea And Vomiting    unknown   Penicillin G Other (See Comments)    Unknown/childhood allergy Has patient had a PCN reaction causing immediate rash, facial/tongue/throat swelling, SOB or lightheadedness with hypotension: Yes Has patient had a PCN reaction causing severe rash involving mucus membranes or skin necrosis: Unknown Has patient had a PCN reaction that required hospitalization: No Has patient had a PCN reaction occurring within the last 10 years: Unknown If all of  the above answers are "NO", then may proceed with Cephalosporin use.     Ativan [Lorazepam] Other (See Comments)    Poor clearance, memory loss    Consultations: GI  Discharge Exam: BP 126/90 (BP Location: Left Arm)   Pulse 77   Temp 97.7 F (36.5 C) (Oral)   Resp 15   Wt 59.5 kg   LMP 07/20/2018   SpO2 100%   BMI 23.24 kg/m  General appearance: alert, cooperative, and no distress Head: Normocephalic, without obvious abnormality, atraumatic Eyes:  EOMI Lungs: clear to auscultation bilaterally Heart: regular rate and rhythm and S1, S2 normal Abdomen: normal findings: bowel sounds normal and soft, non-tender Extremities:  no edema Skin: mobility and turgor normal Neurologic: Grossly normal  The results of significant diagnostics from this hospitalization (including imaging, microbiology, ancillary and laboratory) are listed below for reference.    Microbiology: Recent Results (from the past 240 hour(s))  Resp Panel by RT-PCR (Flu A&B, Covid) Nasopharyngeal Swab     Status: None   Collection Time: 06/27/21  9:02 PM   Specimen: Nasopharyngeal Swab; Nasopharyngeal(NP) swabs in vial transport medium  Result Value Ref Range Status   SARS Coronavirus 2 by RT PCR NEGATIVE NEGATIVE Final    Comment: (NOTE) SARS-CoV-2 target nucleic acids are NOT DETECTED.  The SARS-CoV-2 RNA is generally detectable in upper respiratory specimens during the acute phase of infection. The lowest concentration of SARS-CoV-2 viral copies this assay can detect is 138 copies/mL. A negative result does not preclude SARS-Cov-2 infection and should not be used as the sole basis for treatment or other patient management decisions. A negative result may occur with  improper specimen collection/handling, submission of specimen other than nasopharyngeal swab, presence of viral mutation(s) within the areas targeted by this assay, and inadequate number of viral copies(<138 copies/mL). A negative result must be combined with clinical observations, patient history, and epidemiological information. The expected result is Negative.  Fact Sheet for Patients:  EntrepreneurPulse.com.au  Fact Sheet for Healthcare Providers:  IncredibleEmployment.be  This test is no t yet approved or cleared by the [place] FDA and  has been authorized for detection and/or diagnosis of SARS-CoV-2 by FDA under an Emergency Use Authorization (EUA). This EUA will remain  in effect (meaning this test can be used) for the duration of the COVID-19 declaration under Section 564(b)(1) of the Act, 21 U.S.C.section 360bbb-3(b)(1), unless the authorization is terminated  or revoked sooner.       Influenza A by PCR NEGATIVE NEGATIVE Final   Influenza B by PCR NEGATIVE NEGATIVE Final    Comment: (NOTE) The Xpert Xpress SARS-CoV-2/FLU/RSV plus assay is  intended as an aid in the diagnosis of influenza from Nasopharyngeal swab specimens and should not be used as a sole basis for treatment. Nasal washings and aspirates are unacceptable for Xpert Xpress SARS-CoV-2/FLU/RSV testing.  Fact Sheet for Patients: EntrepreneurPulse.com.au  Fact Sheet for Healthcare Providers: IncredibleEmployment.be  This test is not yet approved or cleared by the [place] FDA and has been authorized for detection and/or diagnosis of SARS-CoV-2 by FDA under an Emergency Use Authorization (EUA). This EUA will remain in effect (meaning this test can be used) for the duration of the COVID-19 declaration under Section 564(b)(1) of the Act, 21 U.S.C. section 360bbb-3(b)(1), unless the authorization is terminated or revoked.  Performed at [facility], [place] [address] [place], [place] [address]   MRSA Next Gen by PCR, Nasal     Status: None   Collection  Time: 06/27/21 11:52 PM   Specimen: Nasal Mucosa; Nasal Swab  Result Value Ref Range Status   MRSA by PCR Next Gen NOT DETECTED NOT DETECTED Final    Comment: (NOTE) The GeneXpert MRSA Assay (FDA approved for NASAL specimens only), is one component of a comprehensive MRSA colonization surveillance program. It is not intended to diagnose MRSA infection nor to guide or monitor treatment for MRSA infections. Test performance is not FDA approved in patients less than 52 years old. Performed at [facility], [place] [address] [place], [place] [address]      Labs: BNP (last 3 results) No results for input(s): BNP in the last 8760 hours. Basic Metabolic Panel: Recent Labs  Lab 06/27/21 2055 06/28/21 0127 06/29/21 0156 06/30/21 0414  NA 141 141 139 143  K 3.2* 3.6 3.6 3.4*  CL 107 109 110 113*  CO2 '26 24 25 26  '$ GLUCOSE 141* 107* 96 103*  BUN 30* 31* 7 7  CREATININE 0.85 0.71 0.72 0.75  CALCIUM 8.9 8.0* 7.5* 8.4*  MG 1.8 1.6*  2.3 1.9   Liver Function Tests: Recent Labs  Lab 06/27/21 2055 06/28/21 0127  AST 20 21  ALT 20 17  ALKPHOS 39 33*  BILITOT 1.0 1.1  PROT 6.5 5.7*  ALBUMIN 3.8 3.4*   No results for input(s): LIPASE, AMYLASE in the last 168 hours. No results for input(s): AMMONIA in the last 168 hours. CBC: Recent Labs  Lab 06/27/21 2055 06/27/21 2232 06/28/21 0127 06/28/21 1037 06/28/21 1816 06/29/21 0156 06/30/21 0414  WBC 11.5*  --  9.6  --   --  4.9 4.4  NEUTROABS  --   --   --   --   --  2.5 2.1  HGB 13.0   < > 11.5* 15.4* 12.8 11.9* 11.9*  HCT 38.7   < > 33.8* 45.4 37.6 35.3* 35.2*  MCV 92.8  --  92.9  --   --  88.5 88.2  PLT 258  --  180  --   --  174 165   < > = values in this interval not displayed.   Cardiac Enzymes: No results for input(s): CKTOTAL, CKMB, CKMBINDEX, TROPONINI in the last 168 hours. BNP: Invalid input(s): POCBNP CBG: No results for input(s): GLUCAP in the last 168 hours. D-Dimer No results for input(s): DDIMER in the last 72 hours. Hgb A1c No results for input(s): HGBA1C in the last 72 hours. Lipid Profile No results for input(s): CHOL, HDL, LDLCALC, TRIG, CHOLHDL, LDLDIRECT in the last 72 hours. Thyroid function studies No results for input(s): TSH, T4TOTAL, T3FREE, THYROIDAB in the last 72 hours.  Invalid input(s): FREET3 Anemia work up No results for input(s): VITAMINB12, FOLATE, FERRITIN, TIBC, IRON, RETICCTPCT in the last 72 hours. Urinalysis    Component Value Date/Time   COLORURINE AMBER (A) 06/12/2021 1330   APPEARANCEUR HAZY (A) 06/12/2021 1330   LABSPEC 1.026 06/12/2021 1330   PHURINE 6.0 06/12/2021 1330   GLUCOSEU NEGATIVE 06/12/2021 1330   HGBUR SMALL (A) 06/12/2021 1330   BILIRUBINUR SMALL (A) 06/12/2021 1330   KETONESUR NEGATIVE 06/12/2021 1330   PROTEINUR 30 (A) 06/12/2021 1330   NITRITE NEGATIVE 06/12/2021 1330   LEUKOCYTESUR NEGATIVE 06/12/2021 1330   Sepsis Labs Invalid input(s): PROCALCITONIN,  WBC,   LACTICIDVEN Microbiology Recent Results (from the past 240 hour(s))  Resp Panel by RT-PCR (Flu A&B, Covid) Nasopharyngeal Swab     Status: None   Collection Time: 06/27/21  9:02 PM   Specimen:  Nasopharyngeal Swab; Nasopharyngeal(NP) swabs in vial transport medium  Result Value Ref Range Status   SARS Coronavirus 2 by RT PCR NEGATIVE NEGATIVE Final    Comment: (NOTE) SARS-CoV-2 target nucleic acids are NOT DETECTED.  The SARS-CoV-2 RNA is generally detectable in upper respiratory specimens during the acute phase of infection. The lowest concentration of SARS-CoV-2 viral copies this assay can detect is 138 copies/mL. A negative result does not preclude SARS-Cov-2 infection and should not be used as the sole basis for treatment or other patient management decisions. A negative result may occur with  improper specimen collection/handling, submission of specimen other than nasopharyngeal swab, presence of viral mutation(s) within the areas targeted by this assay, and inadequate number of viral copies(<138 copies/mL). A negative result must be combined with clinical observations, patient history, and epidemiological information. The expected result is Negative.  Fact Sheet for Patients:  EntrepreneurPulse.com.au  Fact Sheet for Healthcare Providers:  IncredibleEmployment.be  This test is no t yet approved or cleared by the [place] FDA and  has been authorized for detection and/or diagnosis of SARS-CoV-2 by FDA under an Emergency Use Authorization (EUA). This EUA will remain  in effect (meaning this test can be used) for the duration of the COVID-19 declaration under Section 564(b)(1) of the Act, 21 U.S.C.section 360bbb-3(b)(1), unless the authorization is terminated  or revoked sooner.       Influenza A by PCR NEGATIVE NEGATIVE Final   Influenza B by PCR NEGATIVE NEGATIVE Final    Comment: (NOTE) The Xpert Xpress SARS-CoV-2/FLU/RSV plus  assay is intended as an aid in the diagnosis of influenza from Nasopharyngeal swab specimens and should not be used as a sole basis for treatment. Nasal washings and aspirates are unacceptable for Xpert Xpress SARS-CoV-2/FLU/RSV testing.  Fact Sheet for Patients: EntrepreneurPulse.com.au  Fact Sheet for Healthcare Providers: IncredibleEmployment.be  This test is not yet approved or cleared by the [place] FDA and has been authorized for detection and/or diagnosis of SARS-CoV-2 by FDA under an Emergency Use Authorization (EUA). This EUA will remain in effect (meaning this test can be used) for the duration of the COVID-19 declaration under Section 564(b)(1) of the Act, 21 U.S.C. section 360bbb-3(b)(1), unless the authorization is terminated or revoked.  Performed at [facility], [place] [address], [place], [place] [address]   MRSA Next Gen by PCR, Nasal     Status: None   Collection Time: 06/27/21 11:52 PM   Specimen: Nasal Mucosa; Nasal Swab  Result Value Ref Range Status   MRSA by PCR Next Gen NOT DETECTED NOT DETECTED Final    Comment: (NOTE) The GeneXpert MRSA Assay (FDA approved for NASAL specimens only), is one component of a comprehensive MRSA colonization surveillance program. It is not intended to diagnose MRSA infection nor to guide or monitor treatment for MRSA infections. Test performance is not FDA approved in patients less than 10 years old. Performed at [facility], [place] [address], [place], [place] [address]     Procedures/Studies: DG Chest 2 View  Result Date: 06/12/2021 CLINICAL DATA:  Chest pain. EXAM: CHEST - 2 VIEW COMPARISON:  07/20/2018. FINDINGS: Cardiac silhouette is within normal limits. The lungs are well inflated. No focal consolidation or mass. No pleural effusion or pneumothorax. Cholecystectomy clips.  No acute osseous abnormality. IMPRESSION: Normal chest Electronically  Signed   By: Michaelle Birks M.D.   On: 06/12/2021 07:55   DG Chest Portable 1 View  Result Date: 06/27/2021 CLINICAL DATA:  GI  bleed. EXAM: PORTABLE CHEST 1 VIEW COMPARISON:  Chest radiograph dated 06/12/2021. FINDINGS: No focal consolidation, pleural effusion, or pneumothorax. The cardiac silhouette is within normal limits. No acute osseous pathology. IMPRESSION: No active disease. Electronically Signed   By: Anner Crete M.D.   On: 06/27/2021 21:31     Time coordinating discharge: Over 30 minutes    Dwyane Dee, MD  [facility] 06/30/2021, 2:10 PM

## 2021-06-30 NOTE — Assessment & Plan Note (Signed)
-   PPI continued at discharge

## 2021-07-01 LAB — BPAM RBC
Blood Product Expiration Date: 202210022359
Blood Product Expiration Date: 202210022359
ISSUE DATE / TIME: 202209020153
ISSUE DATE / TIME: 202209020505
Unit Type and Rh: 5100
Unit Type and Rh: 5100

## 2021-07-01 LAB — TYPE AND SCREEN
ABO/RH(D): O POS
Antibody Screen: NEGATIVE
Unit division: 0
Unit division: 0

## 2021-07-02 ENCOUNTER — Telehealth: Payer: Self-pay

## 2021-07-02 NOTE — Telephone Encounter (Signed)
  Follow up Call-  Call back number 06/27/2021 03/21/2020  Post procedure Call Back phone  # [phone]  Permission to leave phone message Yes Yes  Some recent data might be hidden     Patient questions:  Do you have a fever, pain , or abdominal swelling? No. Pain Score  0 *  Have you tolerated food without any problems? Yes.    Have you been able to return to your normal activities? Yes.    Do you have any questions about your discharge instructions: Diet   No. Medications  No. Follow up visit  No.  Do you have questions or concerns about your Care? Yes.    Pt had started vomiting and passing blood from the rectum after the procedure.  Pt states within 2 hours she was at the ER at [facility], pt feels she could have been addressed in the recovery area and sent to hospital from [place] but states nurse in recovery was saying we needed the bed and she needed to go, let pt know I would notify our staff of this and that if she has any other cares or concerns to please call and let us know.  Pt states she feels she did fine, got to the ER and then had to have surgery, Dr Lyndel Safe saw her in the hospital and she now has no other c/o, discharged yesterday and feeling better.  Actions: * If pain score is 4 or above: No action needed, pain <4.  Have you developed a fever since your procedure? no  2.   Have you had an respiratory symptoms (SOB or cough) since your procedure? no  3.   Have you tested positive for COVID 19 since your procedure no  4.   Have you had any family members/close contacts diagnosed with the COVID 19 since your procedure?  no   If yes to any of these questions please route to Joylene John, RN and Joella Prince, RN

## 2021-07-03 NOTE — Telephone Encounter (Signed)
Can you please call her and see how she is feeling Had bleeding after EGD and was in the hospital for 2 days RG

## 2021-07-04 DIAGNOSIS — K922 Gastrointestinal hemorrhage, unspecified: Secondary | ICD-10-CM | POA: Diagnosis not present

## 2021-07-04 DIAGNOSIS — D62 Acute posthemorrhagic anemia: Secondary | ICD-10-CM | POA: Diagnosis not present

## 2021-07-04 DIAGNOSIS — E876 Hypokalemia: Secondary | ICD-10-CM | POA: Diagnosis not present

## 2021-07-04 DIAGNOSIS — K200 Eosinophilic esophagitis: Secondary | ICD-10-CM | POA: Diagnosis not present

## 2021-07-04 NOTE — Telephone Encounter (Signed)
LVM for patient to call back. ?

## 2021-07-04 NOTE — Telephone Encounter (Signed)
I spoke to patient, she says that she is feeling better. She is complaining of still having some tenderness at the top of her stomach in the center. Patient says that she has not thrown up today and has not had a bowel movement or seen any blood. Patient went to Dr Delena Bali office today for bloodwork. I will request these records.

## 2021-07-06 ENCOUNTER — Encounter: Payer: Self-pay | Admitting: Gastroenterology

## 2021-07-08 ENCOUNTER — Ambulatory Visit: Payer: Federal, State, Local not specified - PPO | Admitting: Allergy and Immunology

## 2021-07-08 NOTE — Telephone Encounter (Signed)
Spoke to patient and she advised she received that patient designation form for appeal and will send as soon as she can.

## 2021-07-09 DIAGNOSIS — F411 Generalized anxiety disorder: Secondary | ICD-10-CM | POA: Diagnosis not present

## 2021-07-15 NOTE — Telephone Encounter (Signed)
Patient never responded back to info needed for appeal for Dupixent

## 2021-07-17 DIAGNOSIS — F411 Generalized anxiety disorder: Secondary | ICD-10-CM | POA: Diagnosis not present

## 2021-07-22 ENCOUNTER — Other Ambulatory Visit: Payer: Self-pay | Admitting: Allergy and Immunology

## 2021-07-25 DIAGNOSIS — F411 Generalized anxiety disorder: Secondary | ICD-10-CM | POA: Diagnosis not present

## 2021-07-29 ENCOUNTER — Other Ambulatory Visit: Payer: Self-pay

## 2021-07-29 ENCOUNTER — Ambulatory Visit (INDEPENDENT_AMBULATORY_CARE_PROVIDER_SITE_OTHER): Payer: Federal, State, Local not specified - PPO | Admitting: Gastroenterology

## 2021-07-29 ENCOUNTER — Encounter: Payer: Self-pay | Admitting: Gastroenterology

## 2021-07-29 VITALS — BP 130/82 | HR 75 | Ht 63.0 in | Wt 126.0 lb

## 2021-07-29 DIAGNOSIS — K219 Gastro-esophageal reflux disease without esophagitis: Secondary | ICD-10-CM

## 2021-07-29 DIAGNOSIS — K200 Eosinophilic esophagitis: Secondary | ICD-10-CM | POA: Diagnosis not present

## 2021-07-29 DIAGNOSIS — K581 Irritable bowel syndrome with constipation: Secondary | ICD-10-CM

## 2021-07-29 MED ORDER — ESOMEPRAZOLE MAGNESIUM 40 MG PO CPDR: 40.0000 mg | DELAYED_RELEASE_CAPSULE | Freq: Every day | ORAL | 4 refills | Status: DC

## 2021-07-29 NOTE — Progress Notes (Signed)
Chief Complaint:   Referring Provider:  Nicoletta Dress, MD      ASSESSMENT AND PLAN;   #1. PPI responsive EoE with GERD/NCCP. Dx 05/2014, treated with PPIs and Flovent, did very well on food restriction.  S/P EGD with dil 72CN 01/31/961 complicated by UGI bleed.  #2. H/O diverticulitis, treated with antibiotics 11/2018. Has mod-severe sigmoid div with possible muscular hypertrophy/stenosis.  #3. IBS-C  Plan - Decrease nexium 40 mg p.o. QD #90. - Restart fiber gummies as needed. - Avoid diary, eggs, potatoes, chocolate and caffeine as suggested by Dr. Neldon Mc. - Since most recent eso biopsies 9/1 showed resolution of EoE, hold off on Dupixent   HPI:    Sherri Ruiz is a 60 y.o. female   FU  Underwent EGD with dil (83MO) complicated by UGI bleeding, requiring hospitalization, 2U. Hb never dropped down much.  Discharge hemoglobin was 11.9.  She had hemoglobin checked a week later which has come up to 13.4  Feels better  No further dysphagia.  Denies having any significant constipation as long as she takes high-fiber diet.  No melena or hematochezia.  No weight loss.    From previous notes: Took a brief course of Flovent, then had oral thrush requiring nystatin.  She has stopped taking Flovent.  She is on restricted diet as suggested by Dr. Neldon Mc.  She tried reducing Nexium to 20 mg p.o. once a day but then had breakthrough heartburn.  She is back on 40 mg p.o. once a day.  Doing well.  No odynophagia or dysphagia.  Rare breakthrough symptoms controlled by GI cocktail/Mylanta.  Stress triggers attacks as well.  Has been seeing a counselor  We had gone over previous CT Abdo/pelvis.  Complex cyst in the liver.  She does want to hold off on MRI at the present time.  Oct 30, 2020 - had covid  Wt Readings from Last 3 Encounters:  07/29/21 126 lb (57.2 kg)  06/29/21 131 lb 2.8 oz (59.5 kg)  06/27/21 127 lb (57.6 kg)     Does admit that she has been under  considerable stress due to family problems (daughters with the grandkids had moved in.  At one point she had 11 people living in her home, she also recently has been promoted in her job).  1 daughter is a Marine scientist at surgical floor [name].  Past GI Proc:  EGD with dil 06/27/2021 - Few circular rings in the proximal and distal esophagus without any obvious stricture. Dilated. BX- NEG for EoE - Small hiatal hernia. - Gastritis. Bx- neg for HP - Multiple gastric polyps. Resected and retrieved x 3. Bx-fundic gland polyps - Complicated by UGI bleeding req hospitalization, 2U    EGD 03/21/2020  -Few circular rings in the distal esophagus (bx-eosinophilic esophagitis Eos> 30 per high-power field) -Small transient hiatal hernia. -Multiple gastric polyps. Bx- fundic gland polyps. -Mild gastritis. Biopsied.  EGD 05/2014-wide-open cervical esophageal web, mild gastritis, gastric polyps.  Eso Bx- showed increased eosinophils over 20 per HPF c/w eosinophilic esophagitis.  Gastric Bx- reactive gastropathy, intestinal metaplasia.  Subsequent EGD 12/06/4763-[id number] of eosinophilic esophagitis on biopsies, gastric mapping negative for intestinal metaplasia, small incidental gastric polyps measuring 4 to 6 mm.  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  Otherwise normal to TI..  10/07/2013-good prep, moderate sigmoid diverticulosis, small internal hemorrhoids.  Otherwise normal to TI.  Next due 09/2023 unless with any new problems. 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 - CT AP 02/2019: IMPRESSION: 1. No  acute findings in the abdomen or pelvis. Relatively advanced left colonic diverticulosis without diverticulitis. 2. 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 3. The 16 mm low-density lesion in the anterior right liver may be a complex cysts, but cannot be definitively characterized. MRI of the abdomen with and without contrast recommended to further evaluate. She wanted to hold off on MRI Past Medical History:  Diagnosis Date   Allergic rhinitis    Allergy    Anxiety    OCC    Asthma    none since on Singulair started    Cataract    removed right eye, left forming    Congenital clotting factor deficiency (HCC)    Eosinophilic esophagitis    GERD (gastroesophageal reflux disease)    High blood pressure    High cholesterol    History of hepatitis A    Hypothyroidism    IBS (irritable bowel syndrome)    IDA (iron deficiency anemia)    LPRD (laryngopharyngeal reflux disease)    Penicillin allergy    Peripheral neuropathy    PONV (postoperative nausea and vomiting)    Due to Demerol    Vitamin B12 deficiency anemia     Past Surgical History:  Procedure Laterality Date   CATARACT EXTRACTION  03/2020   CHOLECYSTECTOMY     COLONOSCOPY     ESOPHAGOGASTRODUODENOSCOPY  06/16/2014   Wide open cervial esophageal web. Mild gastrtitis. Gastric polyps.    ESOPHAGOGASTRODUODENOSCOPY  12/25/2017   ESOPHAGOGASTRODUODENOSCOPY  06/27/2021   OTHER SURGICAL HISTORY     Hysterectomy   TOTAL ABDOMINAL HYSTERECTOMY     UPPER GASTROINTESTINAL ENDOSCOPY      Family History  Problem Relation Age of Onset   Clotting disorder Mother    High Cholesterol Mother    Asthma Mother    Stroke Mother    High blood pressure Father    Heart attack Father 61       [place].  Atrial fib.     Colon polyps Father    Cancer Sister    Asthma Daughter    Heart disease Brother        Pacemaker   Stomach cancer Maternal Aunt    Esophageal cancer Other    Colon cancer Neg Hx    Rectal  cancer Neg Hx     Social History   Tobacco Use   Smoking status: Former    Types: Cigarettes    Quit date: 1989    Years since quitting: 33.7   Smokeless tobacco: Never  Vaping Use   Vaping Use: Never used  Substance Use Topics   Alcohol use: Not Currently   Drug use: Never    Current Outpatient Medications  Medication Sig Dispense Refill   Alum & Mag Hydroxide-Simeth (GI COCKTAIL) SUSP suspension Take 30 mLs by mouth 2 (two) times daily. Shake well. 240 mL 0   AMBULATORY NON FORMULARY MEDICATION Medication Name: GI Cocktail (Equal amounts of Maalox, Viscous Lidocaine, Bentyl)  Take 10 cc by mouth every 8 hours as needed. 120 mL 2   atorvastatin (LIPITOR) 20 MG tablet Take 20 mg by mouth at bedtime.   1  Cetirizine HCl (ZYRTEC PO) Take 1 tablet by mouth daily.      dicyclomine (BENTYL) 20 MG tablet Take 1 tablet (20 mg total) by mouth 2 (two) times daily. (Patient taking differently: Take 20 mg by mouth 2 (two) times daily as needed for spasms.) 20 tablet 0   esomeprazole (NEXIUM) 40 MG capsule Take 1 capsule (40 mg total) by mouth 2 (two) times daily. 180 capsule 3   levothyroxine (SYNTHROID, LEVOTHROID) 50 MCG tablet Take 50 mcg by mouth daily before breakfast.      lisinopril (PRINIVIL,ZESTRIL) 5 MG tablet Take 5 mg by mouth daily.      MINIVELLE 0.1 MG/24HR patch Place 1 patch onto the skin once a week. Friday     montelukast (SINGULAIR) 10 MG tablet Take 1 tablet (10 mg total) by mouth at bedtime. 30 tablet 5   Multiple Vitamins-Minerals (HAIR SKIN AND NAILS FORMULA PO) Take 1 tablet by mouth daily.     Multiple Vitamins-Minerals (MULTIVITAMIN PO) Take 1 tablet by mouth daily.     Probiotic Product (DIGESTIVE ADVANTAGE PO) Take 1 tablet by mouth daily.     promethazine (PHENERGAN) 25 MG tablet Take 1 tablet (25 mg total) by mouth every 8 (eight) hours as needed for nausea. 30 tablet 0   Simethicone (GAS-X PO) Take 1 tablet by mouth 3 (three) times daily as needed.     No  current facility-administered medications for this visit.    Allergies  Allergen Reactions   Clindamycin/Lincomycin Swelling   Meperidine Nausea And Vomiting    unknown   Penicillin G Other (See Comments)    Unknown/childhood allergy Has patient had a PCN reaction causing immediate rash, facial/tongue/throat swelling, SOB or lightheadedness with hypotension: Yes Has patient had a PCN reaction causing severe rash involving mucus membranes or skin necrosis: Unknown Has patient had a PCN reaction that required hospitalization: No Has patient had a PCN reaction occurring within the last 10 years: Unknown If all of the above answers are "NO", then may proceed with Cephalosporin use.     Ativan [Lorazepam] Other (See Comments)    Poor clearance, memory loss    Review of Systems:  Negative except for HPI     Physical Exam:    Today's Vitals   07/29/21 1517  BP: 130/82  Pulse: 75  SpO2: 98%  Weight: 126 lb (57.2 kg)  Height: 5\' 3"  (1.6 m)   Body mass index is 22.32 kg/m. Constitutional:  Well-developed, in no acute distress. Psychiatric: Normal mood and affect. Behavior is normal. HEENT: Pupils normal.  Conjunctivae are normal. No scleral icterus. Cardiovascular: Normal rate, regular rhythm. No edema Pulmonary/chest: Effort normal and breath sounds normal. No wheezing, rales or rhonchi. Abdominal: Soft, nondistended.  Mild epigastric/left upper quadrant tenderness. Bowel sounds active throughout. There are no masses palpable. No hepatomegaly. Neurological: Alert and oriented to person place and time. Skin: Skin is warm and dry. No rashes noted.  Data Reviewed: I have personally reviewed following labs and imaging studies  CBC: CBC Latest Ref Rng & Units 06/30/2021 06/29/2021 06/28/2021  WBC 4.0 - 10.5 K/uL 4.4 4.9 -  Hemoglobin 12.0 - 15.0 g/dL 11.9(L) 11.9(L) 12.8  Hematocrit 36.0 - 46.0 % 35.2(L) 35.3(L) 37.6  Platelets 150 - 400 K/uL 165 174 -    CMP: CMP Latest Ref Rng  & Units 06/30/2021 06/29/2021 06/28/2021  Glucose 70 - 99 mg/dL 103(H) 96 107(H)  BUN 6 - 20 mg/dL 7 7 31(H)  Creatinine 0.44 - 1.00 mg/dL  0.75 0.72 0.71  Sodium 135 - 145 mmol/L 143 139 141  Potassium 3.5 - 5.1 mmol/L 3.4(L) 3.6 3.6  Chloride 98 - 111 mmol/L 113(H) 110 109  CO2 22 - 32 mmol/L 26 25 24   Calcium 8.9 - 10.3 mg/dL 8.4(L) 7.5(L) 8.0(L)  Total Protein 6.5 - 8.1 g/dL - - 5.7(L)  Total Bilirubin 0.3 - 1.2 mg/dL - - 1.1  Alkaline Phos 38 - 126 U/L - - 33(L)  AST 15 - 41 U/L - - 21  ALT 0 - 44 U/L - - 17      Carmell Austria, MD 07/29/2021, 3:22 PM  Cc: Nicoletta Dress, MD

## 2021-07-29 NOTE — Patient Instructions (Addendum)
If you are age 58 or older, your body mass index should be between 23-30. Your Body mass index is 22.32 kg/m. If this is out of the aforementioned range listed, please consider follow up with your Primary Care Provider.  If you are age 29 or younger, your body mass index should be between 19-25. Your Body mass index is 22.32 kg/m. If this is out of the aformentioned range listed, please consider follow up with your Primary Care Provider.   __________________________________________________________  The [facility] providers would like to encourage you to use [facility] to communicate with providers for non-urgent requests or questions.  Due to long hold times on the telephone, sending your provider a message by [facility] may be a faster and more efficient way to get a response.  Please allow 48 business hours for a response.  Please remember that this is for non-urgent requests.   We have sent the following medications to your pharmacy for you to pick up at your convenience: Nexium  Please purchase the following medications over the counter and take as directed: Miralax as needed Can restart fiber gummies daily  Thank you,  Dr. Jackquline Denmark

## 2021-08-02 DIAGNOSIS — F411 Generalized anxiety disorder: Secondary | ICD-10-CM | POA: Diagnosis not present

## 2021-08-07 DIAGNOSIS — B9689 Other specified bacterial agents as the cause of diseases classified elsewhere: Secondary | ICD-10-CM | POA: Diagnosis not present

## 2021-08-07 DIAGNOSIS — J019 Acute sinusitis, unspecified: Secondary | ICD-10-CM | POA: Diagnosis not present

## 2021-08-07 DIAGNOSIS — F411 Generalized anxiety disorder: Secondary | ICD-10-CM | POA: Diagnosis not present

## 2021-08-07 DIAGNOSIS — J208 Acute bronchitis due to other specified organisms: Secondary | ICD-10-CM | POA: Diagnosis not present

## 2021-08-13 DIAGNOSIS — F411 Generalized anxiety disorder: Secondary | ICD-10-CM | POA: Diagnosis not present

## 2021-08-21 DIAGNOSIS — F411 Generalized anxiety disorder: Secondary | ICD-10-CM | POA: Diagnosis not present

## 2021-08-28 DIAGNOSIS — F411 Generalized anxiety disorder: Secondary | ICD-10-CM | POA: Diagnosis not present

## 2021-08-30 DIAGNOSIS — L301 Dyshidrosis [pompholyx]: Secondary | ICD-10-CM | POA: Diagnosis not present

## 2021-08-30 DIAGNOSIS — R233 Spontaneous ecchymoses: Secondary | ICD-10-CM | POA: Diagnosis not present

## 2021-09-04 DIAGNOSIS — F411 Generalized anxiety disorder: Secondary | ICD-10-CM | POA: Diagnosis not present

## 2021-09-07 DIAGNOSIS — J029 Acute pharyngitis, unspecified: Secondary | ICD-10-CM | POA: Diagnosis not present

## 2021-09-11 DIAGNOSIS — F411 Generalized anxiety disorder: Secondary | ICD-10-CM | POA: Diagnosis not present

## 2021-10-07 DIAGNOSIS — L0231 Cutaneous abscess of buttock: Secondary | ICD-10-CM | POA: Diagnosis not present

## 2021-10-14 DIAGNOSIS — L0231 Cutaneous abscess of buttock: Secondary | ICD-10-CM | POA: Diagnosis not present

## 2021-11-04 DIAGNOSIS — L0231 Cutaneous abscess of buttock: Secondary | ICD-10-CM | POA: Diagnosis not present

## 2021-11-18 DIAGNOSIS — M26621 Arthralgia of right temporomandibular joint: Secondary | ICD-10-CM | POA: Diagnosis not present

## 2021-11-18 DIAGNOSIS — L0231 Cutaneous abscess of buttock: Secondary | ICD-10-CM | POA: Diagnosis not present

## 2021-11-29 DIAGNOSIS — Z23 Encounter for immunization: Secondary | ICD-10-CM | POA: Diagnosis not present

## 2021-11-29 DIAGNOSIS — E785 Hyperlipidemia, unspecified: Secondary | ICD-10-CM | POA: Diagnosis not present

## 2021-11-29 DIAGNOSIS — K219 Gastro-esophageal reflux disease without esophagitis: Secondary | ICD-10-CM | POA: Diagnosis not present

## 2021-11-29 DIAGNOSIS — I10 Essential (primary) hypertension: Secondary | ICD-10-CM | POA: Diagnosis not present

## 2021-11-29 DIAGNOSIS — R7301 Impaired fasting glucose: Secondary | ICD-10-CM | POA: Diagnosis not present

## 2021-11-29 DIAGNOSIS — E039 Hypothyroidism, unspecified: Secondary | ICD-10-CM | POA: Diagnosis not present

## 2021-12-18 DIAGNOSIS — F411 Generalized anxiety disorder: Secondary | ICD-10-CM | POA: Diagnosis not present

## 2021-12-25 DIAGNOSIS — F411 Generalized anxiety disorder: Secondary | ICD-10-CM | POA: Diagnosis not present

## 2022-01-01 DIAGNOSIS — F411 Generalized anxiety disorder: Secondary | ICD-10-CM | POA: Diagnosis not present

## 2022-01-15 DIAGNOSIS — F411 Generalized anxiety disorder: Secondary | ICD-10-CM | POA: Diagnosis not present

## 2022-01-29 DIAGNOSIS — F411 Generalized anxiety disorder: Secondary | ICD-10-CM | POA: Diagnosis not present

## 2022-02-05 DIAGNOSIS — F411 Generalized anxiety disorder: Secondary | ICD-10-CM | POA: Diagnosis not present

## 2022-02-09 ENCOUNTER — Other Ambulatory Visit: Payer: Self-pay | Admitting: Allergy and Immunology

## 2022-02-10 DIAGNOSIS — J45901 Unspecified asthma with (acute) exacerbation: Secondary | ICD-10-CM | POA: Diagnosis not present

## 2022-02-10 DIAGNOSIS — J301 Allergic rhinitis due to pollen: Secondary | ICD-10-CM | POA: Diagnosis not present

## 2022-02-10 DIAGNOSIS — T753XXA Motion sickness, initial encounter: Secondary | ICD-10-CM | POA: Diagnosis not present

## 2022-02-11 DIAGNOSIS — F419 Anxiety disorder, unspecified: Secondary | ICD-10-CM | POA: Diagnosis not present

## 2022-02-13 DIAGNOSIS — Z1231 Encounter for screening mammogram for malignant neoplasm of breast: Secondary | ICD-10-CM | POA: Diagnosis not present

## 2022-02-19 ENCOUNTER — Encounter: Payer: Self-pay | Admitting: Allergy and Immunology

## 2022-02-19 ENCOUNTER — Ambulatory Visit: Payer: Federal, State, Local not specified - PPO | Admitting: Allergy and Immunology

## 2022-02-19 VITALS — BP 132/82 | HR 68 | Resp 16 | Ht 62.4 in | Wt 134.0 lb

## 2022-02-19 DIAGNOSIS — K200 Eosinophilic esophagitis: Secondary | ICD-10-CM

## 2022-02-19 DIAGNOSIS — J4530 Mild persistent asthma, uncomplicated: Secondary | ICD-10-CM | POA: Diagnosis not present

## 2022-02-19 DIAGNOSIS — K219 Gastro-esophageal reflux disease without esophagitis: Secondary | ICD-10-CM | POA: Diagnosis not present

## 2022-02-19 DIAGNOSIS — J3089 Other allergic rhinitis: Secondary | ICD-10-CM

## 2022-02-19 MED ORDER — FLOVENT HFA 110 MCG/ACT IN AERO: INHALATION_SPRAY | RESPIRATORY_TRACT | 5 refills | Status: DC

## 2022-02-19 MED ORDER — MONTELUKAST SODIUM 10 MG PO TABS: 10.0000 mg | ORAL_TABLET | Freq: Every day | ORAL | 5 refills | Status: DC

## 2022-02-19 NOTE — Progress Notes (Signed)
? ?[facility] ? ? ?Follow-up Note ? ?Referring Provider: Nicoletta Dress, MD ?Primary Provider: Nicoletta Dress, MD ?Date of Office Visit: 02/19/2022 ? ?Subjective:  ? ?Sherri Ruiz (DOB: 12/30/1960) is a 61 y.o. female who returns to the [facility] on 02/19/2022 in re-evaluation of the following: ? ?HPI: Sherri Ruiz returns to this clinic in evaluation of eosinophilic esophagitis, allergic rhinitis, and LPR.  Her last visit to this clinic was 03 June 2021. ? ?She continues to undergo evaluation regarding her abdominal issues with Dr. Lyndel Safe and underwent an upper endoscopy and esophageal dilation September 8588 complicated by an upper GI bleed.  She definitely does better regarding all of her abdominal issues and swallowing issues when she is on a somewhat restrictive diet with elimination of egg and dairy and potato.  If she is very good about her diet then she really has no issues with her abdominal syndrome while also continuing to treat reflux with Nexium 40 mg daily. ? ?Her upper airway has been doing okay but she apparently did develop an episode of "bronchitis" with some coughing this spring and this is a little bit of a persistent issue for the past month or so and her nose has been somewhat stuffy.  She does use Nasacort on a pretty consistent basis and uses some cetirizine and also continues to use montelukast consistently.  She has an albuterol inhaler which she uses infrequently and when she does use this inhaler it does help with some of her cough. ? ?Allergies as of 02/19/2022   ? ?   Reactions  ? Clindamycin/lincomycin Swelling  ? Meperidine Nausea And Vomiting  ? unknown  ? Penicillin G Other (See Comments)  ? Unknown/childhood allergy ?Has patient had a PCN reaction causing immediate rash, facial/tongue/throat swelling, SOB or lightheadedness with hypotension: Yes ?Has patient had a PCN reaction causing severe rash involving mucus  membranes or skin necrosis: Unknown ?Has patient had a PCN reaction that required hospitalization: No ?Has patient had a PCN reaction occurring within the last 10 years: Unknown ?If all of the above answers are "NO", then may proceed with Cephalosporin use.  ? Ativan [lorazepam] Other (See Comments)  ? Poor clearance, memory loss  ? ?  ? ?  ?Medication List  ? ? ?albuterol 108 (90 Base) MCG/ACT inhaler ?Commonly known as: VENTOLIN HFA ?SMARTSIG:2 Puff(s) By Mouth 4 Times Daily PRN ?  ?ALPRAZolam 0.25 MG tablet ?Commonly known as: Duanne Moron ?Take 0.25 mg by mouth 2 (two) times daily as needed. ?  ?AMBULATORY NON FORMULARY MEDICATION ?Medication Name: GI Cocktail ?(Equal amounts of Maalox, Viscous Lidocaine, Bentyl)  ?Take 10 cc by mouth every 8 hours as needed. ?  ?atorvastatin 20 MG tablet ?Commonly known as: LIPITOR ?Take 20 mg by mouth at bedtime. ?  ?DIGESTIVE ADVANTAGE PO ?Take 1 tablet by mouth daily. ?  ?esomeprazole 40 MG capsule ?Commonly known as: NexIUM ?Take 1 capsule (40 mg total) by mouth daily. ?  ?GAS-X PO ?Take 1 tablet by mouth 3 (three) times daily as needed. ?  ?gi cocktail Susp suspension ?Take 30 mLs by mouth 2 (two) times daily. Shake well. ?  ?HAIR SKIN AND NAILS FORMULA PO ?Take 1 tablet by mouth daily. ?  ?levothyroxine 50 MCG tablet ?Commonly known as: SYNTHROID ?Take 50 mcg by mouth daily before breakfast. ?  ?lisinopril 5 MG tablet ?Commonly known as: ZESTRIL ?Take 5 mg by mouth daily. ?  ?Minivelle 0.1 MG/24HR  patch ?Generic drug: estradiol ?Place 1 patch onto the skin once a week. Friday ?  ?montelukast 10 MG tablet ?Commonly known as: SINGULAIR ?Take 1 tablet (10 mg total) by mouth at bedtime. ?  ?MULTIVITAMIN PO ?Take 1 tablet by mouth daily. ?  ?NUTRITIONAL SUPPLEMENT PO ?Take by mouth. Vitamin D3 + Zinc + Vitamin C ?  ?promethazine 25 MG tablet ?Commonly known as: PHENERGAN ?Take 1 tablet (25 mg total) by mouth every 8 (eight) hours as needed for nausea. ?  ?scopolamine 1  MG/3DAYS ?Commonly known as: TRANSDERM-SCOP ?1 patch every 3 (three) days. ?  ?ZYRTEC PO ?Take 1 tablet by mouth daily. ?  ? ?Past Medical History:  ?Diagnosis Date  ? Allergic rhinitis   ? Allergy   ? Anxiety   ? OCC   ? Asthma   ? none since on Singulair started   ? Cataract   ? removed right eye, left forming   ? Congenital clotting factor deficiency ([place])   ? Eosinophilic esophagitis   ? GERD (gastroesophageal reflux disease)   ? High blood pressure   ? High cholesterol   ? History of hepatitis A   ? Hypothyroidism   ? IBS (irritable bowel syndrome)   ? IDA (iron deficiency anemia)   ? LPRD (laryngopharyngeal reflux disease)   ? Penicillin allergy   ? Peripheral neuropathy   ? PONV (postoperative nausea and vomiting)   ? Due to Demerol   ? Vitamin B12 deficiency anemia   ? ? ?Past Surgical History:  ?Procedure Laterality Date  ? CATARACT EXTRACTION  03/2020  ? CHOLECYSTECTOMY    ? COLONOSCOPY    ? ESOPHAGOGASTRODUODENOSCOPY  06/16/2014  ? Wide open cervial esophageal web. Mild gastrtitis. Gastric polyps.   ? ESOPHAGOGASTRODUODENOSCOPY  12/25/2017  ? ESOPHAGOGASTRODUODENOSCOPY  06/27/2021  ? OTHER SURGICAL HISTORY    ? Hysterectomy  ? TOTAL ABDOMINAL HYSTERECTOMY    ? UPPER GASTROINTESTINAL ENDOSCOPY    ? ? ?Review of systems negative except as noted in HPI / PMHx or noted below: ? ?Review of Systems  ?Constitutional: Negative.   ?HENT: Negative.    ?Eyes: Negative.   ?Respiratory: Negative.    ?Cardiovascular: Negative.   ?Gastrointestinal: Negative.   ?Genitourinary: Negative.   ?Musculoskeletal: Negative.   ?Skin: Negative.   ?Neurological: Negative.   ?Endo/Heme/Allergies: Negative.   ?Psychiatric/Behavioral: Negative.    ? ? ?Objective:  ? ?Vitals:  ? 02/19/22 1054  ?BP: 132/82  ?Pulse: 68  ?Resp: 16  ?SpO2: 97%  ? ?Height: 5' 2.4" (158.5 cm)  ?Weight: 134 lb (60.8 kg)  ? ?Physical Exam ?Constitutional:   ?   Appearance: She is not diaphoretic.  ?HENT:  ?   Head: Normocephalic.  ?   Right Ear: Tympanic  membrane, ear canal and external ear normal.  ?   Left Ear: Tympanic membrane, ear canal and external ear normal.  ?   Nose: Nose normal. No mucosal edema or rhinorrhea.  ?   Mouth/Throat:  ?   Pharynx: Uvula midline. No oropharyngeal exudate.  ?Eyes:  ?   Conjunctiva/sclera: Conjunctivae normal.  ?Neck:  ?   Thyroid: No thyromegaly.  ?   Trachea: Trachea normal. No tracheal tenderness or tracheal deviation.  ?Cardiovascular:  ?   Rate and Rhythm: Normal rate and regular rhythm.  ?   Heart sounds: Normal heart sounds, S1 normal and S2 normal. No murmur heard. ?Pulmonary:  ?   Effort: No respiratory distress.  ?   Breath sounds: Normal breath sounds. No  stridor. No wheezing or rales.  ?Lymphadenopathy:  ?   Head:  ?   Right side of head: No tonsillar adenopathy.  ?   Left side of head: No tonsillar adenopathy.  ?   Cervical: No cervical adenopathy.  ?Skin: ?   Findings: No erythema or rash.  ?   Nails: There is no clubbing.  ?Neurological:  ?   Mental Status: She is alert.  ? ? ?Diagnostics:  ?  ?Spirometry was performed and demonstrated an FEV1 of 2.39 at 101 % of predicted. ? ?Results of a upper endoscopy biopsy performed 27 June 2021 identified the following: ? ?1. Surgical [P], gastric antrum and gastric body ?- BENIGN GASTRIC MUCOSA WITH MILD REACTIVE CHANGES AND FEATURES SUGGESTIVE OF PROTON PUMP INHIBITOR EFFECT ?- NO H. PYLORI, INTESTINAL METAPLASIA OR MALIGNANCY IDENTIFIED ?2. Surgical [P], esophagus ?- BENIGN SQUAMOUS MUCOSA ?- NO INCREASED INTRAEPITHELIAL EOSINOPHILS ?3. Surgical [P], gastric polyps ?- FUNDIC GLAND POLYP(S) ?- BENIGN SQUAMOUS MUCOSA ?- NO H. PYLORI, INTESTINAL METAPLASIA OR MALIGNANCY IDENTIFIED ? ?Assessment and Plan:  ? ?1. Not well controlled mild persistent asthma   ?2. Other allergic rhinitis   ?3. LPRD (laryngopharyngeal reflux disease)   ?4. Eosinophilic esophagitis   ? ? ?1. Continue to avoid all dairy and egg and potato consumption.   ? ?2. Continue to Treat  inflammation: ? ? A. OTC Nasacort 1 spray each nostril 1 time per day ? B. montelukast 10 mg tablet 1 time per day ? C. Flovent 110 - 2 inhalations 1 time per day w/spacer (empty lungs) ? ?3. Continue to Treat reflux: ? ? A. Nexium 40 mg table

## 2022-02-19 NOTE — Patient Instructions (Addendum)
?  1. Continue to avoid all dairy and egg and potato consumption.   ? ?2. Continue to Treat inflammation: ? ? A. OTC Nasacort 1 spray each nostril 1 time per day ? B. montelukast 10 mg tablet 1 time per day ? C. Flovent 110 - 2 inhalations 1 time per day w/spacer (empty lungs) ? ?3. Continue to Treat reflux: ? ? A. Nexium 40 mg tablet in PM ? ?4.  Can add OTC antihistamine - cetirizine - if needed ? ?5. Return to clinic in one year ? ?  ? ?  ? ?

## 2022-02-20 ENCOUNTER — Encounter: Payer: Self-pay | Admitting: Allergy and Immunology

## 2022-02-20 DIAGNOSIS — F411 Generalized anxiety disorder: Secondary | ICD-10-CM | POA: Diagnosis not present

## 2022-02-25 DIAGNOSIS — F419 Anxiety disorder, unspecified: Secondary | ICD-10-CM | POA: Diagnosis not present

## 2022-03-20 DIAGNOSIS — Z90710 Acquired absence of both cervix and uterus: Secondary | ICD-10-CM | POA: Diagnosis not present

## 2022-03-20 DIAGNOSIS — Z1329 Encounter for screening for other suspected endocrine disorder: Secondary | ICD-10-CM | POA: Diagnosis not present

## 2022-03-20 DIAGNOSIS — Z8489 Family history of other specified conditions: Secondary | ICD-10-CM | POA: Diagnosis not present

## 2022-03-20 DIAGNOSIS — Z803 Family history of malignant neoplasm of breast: Secondary | ICD-10-CM | POA: Diagnosis not present

## 2022-03-20 DIAGNOSIS — Z836 Family history of other diseases of the respiratory system: Secondary | ICD-10-CM | POA: Diagnosis not present

## 2022-03-20 DIAGNOSIS — Z8249 Family history of ischemic heart disease and other diseases of the circulatory system: Secondary | ICD-10-CM | POA: Diagnosis not present

## 2022-03-20 DIAGNOSIS — Z809 Family history of malignant neoplasm, unspecified: Secondary | ICD-10-CM | POA: Diagnosis not present

## 2022-03-20 DIAGNOSIS — Z83438 Family history of other disorder of lipoprotein metabolism and other lipidemia: Secondary | ICD-10-CM | POA: Diagnosis not present

## 2022-03-20 DIAGNOSIS — Z01419 Encounter for gynecological examination (general) (routine) without abnormal findings: Secondary | ICD-10-CM | POA: Diagnosis not present

## 2022-03-20 DIAGNOSIS — Z13220 Encounter for screening for lipoid disorders: Secondary | ICD-10-CM | POA: Diagnosis not present

## 2022-03-20 DIAGNOSIS — Z131 Encounter for screening for diabetes mellitus: Secondary | ICD-10-CM | POA: Diagnosis not present

## 2022-03-20 DIAGNOSIS — Z8041 Family history of malignant neoplasm of ovary: Secondary | ICD-10-CM | POA: Diagnosis not present

## 2022-05-29 DIAGNOSIS — E039 Hypothyroidism, unspecified: Secondary | ICD-10-CM | POA: Diagnosis not present

## 2022-05-29 DIAGNOSIS — R7301 Impaired fasting glucose: Secondary | ICD-10-CM | POA: Diagnosis not present

## 2022-05-29 DIAGNOSIS — E785 Hyperlipidemia, unspecified: Secondary | ICD-10-CM | POA: Diagnosis not present

## 2022-05-29 DIAGNOSIS — I10 Essential (primary) hypertension: Secondary | ICD-10-CM | POA: Diagnosis not present

## 2022-05-29 DIAGNOSIS — K219 Gastro-esophageal reflux disease without esophagitis: Secondary | ICD-10-CM | POA: Diagnosis not present

## 2022-07-01 DIAGNOSIS — E039 Hypothyroidism, unspecified: Secondary | ICD-10-CM | POA: Diagnosis not present

## 2022-07-10 DIAGNOSIS — R197 Diarrhea, unspecified: Secondary | ICD-10-CM | POA: Diagnosis not present

## 2022-07-28 DIAGNOSIS — R1012 Left upper quadrant pain: Secondary | ICD-10-CM | POA: Diagnosis not present

## 2022-07-28 DIAGNOSIS — R1011 Right upper quadrant pain: Secondary | ICD-10-CM | POA: Diagnosis not present

## 2022-07-30 DIAGNOSIS — R1012 Left upper quadrant pain: Secondary | ICD-10-CM | POA: Diagnosis not present

## 2022-07-30 DIAGNOSIS — K769 Liver disease, unspecified: Secondary | ICD-10-CM | POA: Diagnosis not present

## 2022-07-30 DIAGNOSIS — R1011 Right upper quadrant pain: Secondary | ICD-10-CM | POA: Diagnosis not present

## 2022-08-14 ENCOUNTER — Other Ambulatory Visit: Payer: Self-pay | Admitting: Allergy and Immunology

## 2022-08-15 ENCOUNTER — Telehealth: Payer: Self-pay

## 2022-08-15 NOTE — Patient Outreach (Signed)
  Care Coordination   08/15/2022 Name: Sherri Ruiz MRN: [id number] DOB: 05/17/1961   Care Coordination Outreach Attempts:  An unsuccessful telephone outreach was attempted today to offer the patient information about available care coordination services as a benefit of their health plan.   Follow Up Plan:  Additional outreach attempts will be made to offer the patient care coordination information and services.   Encounter Outcome:  No Answer  Care Coordination Interventions Activated:  No   Care Coordination Interventions:  No, not indicated    Tomasa Rand, RN, BSN, CEN [facility] [employer] [phone]

## 2022-08-22 ENCOUNTER — Telehealth: Payer: Self-pay

## 2022-08-22 NOTE — Patient Outreach (Signed)
  Care Coordination   08/22/2022 Name: Sherri Ruiz MRN: [id number] DOB: 24-May-1961   Care Coordination Outreach Attempts:  A second unsuccessful outreach was attempted today to offer the patient with information about available care coordination services as a benefit of their health plan.     Follow Up Plan:  Additional outreach attempts will be made to offer the patient care coordination information and services.   Encounter Outcome:  No Answer  Care Coordination Interventions Activated:  No   Care Coordination Interventions:  No, not indicated    Tomasa Rand, RN, BSN, CEN [facility] [employer] [phone]

## 2022-08-26 DIAGNOSIS — F411 Generalized anxiety disorder: Secondary | ICD-10-CM | POA: Diagnosis not present

## 2022-08-27 ENCOUNTER — Telehealth: Payer: Self-pay

## 2022-08-27 NOTE — Patient Outreach (Signed)
  Care Coordination   Initial Visit Note   08/27/2022 Name: Sherri Ruiz MRN: [id number] DOB: [birth year]  Sherri Ruiz is a 61 y.o. year old female who sees Nicoletta Dress, MD for primary care. I spoke with  Heywood Iles by phone today.  What matters to the patients health and wellness today?  Placed call to patient to review and offer [facility] care coordination program. Patient has verbally consented and reports she wants to work on her pre DM.       SDOH assessments and interventions completed:  No     Care Coordination Interventions Activated:  No  Care Coordination Interventions:  No, not indicated   Follow up plan: Follow up call scheduled for 09/08/2022    Encounter Outcome:  Pt. Visit Completed   Tomasa Rand, RN, BSN, CEN [facility] Coordinator [phone]

## 2022-09-02 ENCOUNTER — Ambulatory Visit: Payer: Federal, State, Local not specified - PPO | Admitting: Podiatry

## 2022-09-02 ENCOUNTER — Ambulatory Visit (INDEPENDENT_AMBULATORY_CARE_PROVIDER_SITE_OTHER): Payer: Federal, State, Local not specified - PPO

## 2022-09-02 DIAGNOSIS — M7741 Metatarsalgia, right foot: Secondary | ICD-10-CM

## 2022-09-02 DIAGNOSIS — M778 Other enthesopathies, not elsewhere classified: Secondary | ICD-10-CM | POA: Diagnosis not present

## 2022-09-02 DIAGNOSIS — L909 Atrophic disorder of skin, unspecified: Secondary | ICD-10-CM | POA: Diagnosis not present

## 2022-09-02 DIAGNOSIS — Q828 Other specified congenital malformations of skin: Secondary | ICD-10-CM | POA: Diagnosis not present

## 2022-09-02 DIAGNOSIS — I10 Essential (primary) hypertension: Secondary | ICD-10-CM | POA: Diagnosis not present

## 2022-09-02 DIAGNOSIS — E039 Hypothyroidism, unspecified: Secondary | ICD-10-CM | POA: Diagnosis not present

## 2022-09-02 DIAGNOSIS — E785 Hyperlipidemia, unspecified: Secondary | ICD-10-CM | POA: Diagnosis not present

## 2022-09-02 DIAGNOSIS — Z1331 Encounter for screening for depression: Secondary | ICD-10-CM | POA: Diagnosis not present

## 2022-09-02 DIAGNOSIS — M7742 Metatarsalgia, left foot: Secondary | ICD-10-CM

## 2022-09-02 DIAGNOSIS — R7301 Impaired fasting glucose: Secondary | ICD-10-CM | POA: Diagnosis not present

## 2022-09-02 DIAGNOSIS — Z23 Encounter for immunization: Secondary | ICD-10-CM | POA: Diagnosis not present

## 2022-09-02 DIAGNOSIS — K219 Gastro-esophageal reflux disease without esophagitis: Secondary | ICD-10-CM | POA: Diagnosis not present

## 2022-09-02 NOTE — Progress Notes (Signed)
Subjective:  Patient ID: Sherri Ruiz, female    DOB: Jul 12, 1961,  MRN: [id number]  Chief Complaint  Patient presents with   Foot Problem    growth on bottom, painful. Numbness to left hallux. Shooting pain.    Callouses    Callus to ball of left foot. Painful.     61 y.o. female presents for initial evaluation of numbness near the left great toe.  She also notices some occasional shooting pains in this area.  She also reports having some painful lesions on the bottom of both feet on the outside of the foot.  Not sure if these are warts or calluses.  She has not done anything for them aside using a toenail clipper to try and trim the one out on the right foot which did seem to help with the pain.  Past Medical History:  Diagnosis Date   Allergic rhinitis    Allergy    Anxiety    OCC    Asthma    none since on Singulair started    Cataract    removed right eye, left forming    Congenital clotting factor deficiency (HCC)    Eosinophilic esophagitis    GERD (gastroesophageal reflux disease)    High blood pressure    High cholesterol    History of hepatitis A    Hypothyroidism    IBS (irritable bowel syndrome)    IDA (iron deficiency anemia)    LPRD (laryngopharyngeal reflux disease)    Penicillin allergy    Peripheral neuropathy    PONV (postoperative nausea and vomiting)    Due to Demerol    Vitamin B12 deficiency anemia     Allergies  Allergen Reactions   Clindamycin/Lincomycin Swelling   Meperidine Nausea And Vomiting    unknown   Penicillin G Other (See Comments)    Unknown/childhood allergy Has patient had a PCN reaction causing immediate rash, facial/tongue/throat swelling, SOB or lightheadedness with hypotension: Yes Has patient had a PCN reaction causing severe rash involving mucus membranes or skin necrosis: Unknown Has patient had a PCN reaction that required hospitalization: No Has patient had a PCN reaction occurring within the last 10 years: Unknown If  all of the above answers are "NO", then may proceed with Cephalosporin use.     Ativan [Lorazepam] Other (See Comments)    Poor clearance, memory loss    ROS: Negative except as per HPI above  Objective:  General: AAO x3, NAD  Dermatological: Attention directed to the plantar aspect of the fifth metatarsal head bilateral foot there is noted to be hyperkeratotic lesion overlying central hyperkeratotic core consistent with porokeratosis.  Pain with direct palpation of these lesions.  No underlying ulceration or sign of infection.  Vascular:  Dorsalis Pedis artery and Posterior Tibial artery pedal pulses are 2/4 bilateral.  Capillary fill time < 3 sec to all digits.   Neruologic: Grossly intact via light touch bilateral. Protective threshold intact to all sites bilateral.   Musculoskeletal: Osseous prominence is noted about the left first metatarsal phalange joint especially dorsal lateral mild spurring present.  No significant pain with range of motion of the left hallux.  With palpation of the plantar forefoot of the left foot there is no to be fat pad atrophy and prominent metatarsal heads.  This does trigger subjective sensation of numbness of the left great toe with palpation of the plantar first interspace.  Gait: Unassisted, Nonantalgic.   No images are attached to the encounter.  Radiographs:  Date:  09/02/2022 XR left foot weightbearing AP/Lateral/Oblique   Findings: degenerative changes of the first metatarsophalangeal joint of the left foot with decreased joint space and osseous spurring present Assessment:   1. Metatarsalgia of both feet   2. Fat pad atrophy of foot   3. Porokeratosis   4. Capsulitis of foot, left      Plan:  Patient was evaluated and treated and all questions answered.  #Metatarsalgia of the left foot and fat pad atrophy -I discussed with the patient that I believe her numbness and occasional shooting pain is related to nerve compression related to  fat pad atrophy metatarsalgia of the left foot. -Recommend cushioned shoes max cushioning walking or running shoes. -Discussed possible medication management in the future if she is not having relief with conservative measures could consider gabapentin. -Also discussed possible need for first metatarsophalangeal joint fusion if the arthritic changes in her left first MPJ worsened over time.  #Bilateral porokeratosis Sub fifth metatarsal head All symptomatic hyperkeratoses x2 bilateral plantar fifth metatarsal head were safely debrided with a sterile #15 blade to patient's level of comfort without incident. We discussed preventative and palliative care of these lesions including supportive and accommodative shoegear, padding, prefabricated and custom molded accommodative orthoses, use of a pumice stone and lotions/creams daily.   Return if symptoms worsen or fail to improve.          Everitt Amber, DPM [facility]

## 2022-09-03 DIAGNOSIS — J019 Acute sinusitis, unspecified: Secondary | ICD-10-CM | POA: Diagnosis not present

## 2022-09-08 ENCOUNTER — Ambulatory Visit: Payer: Self-pay

## 2022-09-08 DIAGNOSIS — F411 Generalized anxiety disorder: Secondary | ICD-10-CM | POA: Diagnosis not present

## 2022-09-15 ENCOUNTER — Other Ambulatory Visit: Payer: Self-pay | Admitting: Gastroenterology

## 2022-09-16 ENCOUNTER — Ambulatory Visit: Payer: Federal, State, Local not specified - PPO | Admitting: Gastroenterology

## 2022-09-17 DIAGNOSIS — J019 Acute sinusitis, unspecified: Secondary | ICD-10-CM | POA: Diagnosis not present

## 2022-09-25 ENCOUNTER — Telehealth: Payer: Self-pay | Admitting: Gastroenterology

## 2022-09-25 MED ORDER — ESOMEPRAZOLE MAGNESIUM 40 MG PO CPDR: 40.0000 mg | DELAYED_RELEASE_CAPSULE | Freq: Every day | ORAL | 1 refills | Status: DC

## 2022-09-25 NOTE — Telephone Encounter (Signed)
Sent to mail order. Patient needs to make an appointment for more refills

## 2022-09-25 NOTE — Telephone Encounter (Signed)
Pharmacy is calling to get a new prescription for esomeprazole. Please advise. Reference #[id number]

## 2022-11-12 ENCOUNTER — Encounter: Payer: Self-pay | Admitting: Gastroenterology

## 2022-11-12 ENCOUNTER — Other Ambulatory Visit (INDEPENDENT_AMBULATORY_CARE_PROVIDER_SITE_OTHER): Payer: Federal, State, Local not specified - PPO

## 2022-11-12 ENCOUNTER — Ambulatory Visit: Payer: Federal, State, Local not specified - PPO | Admitting: Gastroenterology

## 2022-11-12 VITALS — BP 128/78 | HR 82 | Ht 62.4 in | Wt 135.0 lb

## 2022-11-12 DIAGNOSIS — K200 Eosinophilic esophagitis: Secondary | ICD-10-CM | POA: Diagnosis not present

## 2022-11-12 DIAGNOSIS — K219 Gastro-esophageal reflux disease without esophagitis: Secondary | ICD-10-CM | POA: Diagnosis not present

## 2022-11-12 DIAGNOSIS — K2970 Gastritis, unspecified, without bleeding: Secondary | ICD-10-CM

## 2022-11-12 DIAGNOSIS — K317 Polyp of stomach and duodenum: Secondary | ICD-10-CM

## 2022-11-12 DIAGNOSIS — R1013 Epigastric pain: Secondary | ICD-10-CM

## 2022-11-12 LAB — CBC WITH DIFFERENTIAL/PLATELET
Basophils Absolute: 0 10*3/uL (ref 0.0–0.1)
Basophils Relative: 0.7 % (ref 0.0–3.0)
Eosinophils Absolute: 0.2 10*3/uL (ref 0.0–0.7)
Eosinophils Relative: 4.8 % (ref 0.0–5.0)
HCT: 35.9 % — ABNORMAL LOW (ref 36.0–46.0)
Hemoglobin: 12.2 g/dL (ref 12.0–15.0)
Lymphocytes Relative: 33 % (ref 12.0–46.0)
Lymphs Abs: 1.6 10*3/uL (ref 0.7–4.0)
MCHC: 34 g/dL (ref 30.0–36.0)
MCV: 83.3 fl (ref 78.0–100.0)
Monocytes Absolute: 0.3 10*3/uL (ref 0.1–1.0)
Monocytes Relative: 7 % (ref 3.0–12.0)
Neutro Abs: 2.7 10*3/uL (ref 1.4–7.7)
Neutrophils Relative %: 54.5 % (ref 43.0–77.0)
Platelets: 298 10*3/uL (ref 150.0–400.0)
RBC: 4.32 Mil/uL (ref 3.87–5.11)
RDW: 14.9 % (ref 11.5–15.5)
WBC: 4.9 10*3/uL (ref 4.0–10.5)

## 2022-11-12 LAB — COMPREHENSIVE METABOLIC PANEL
ALT: 16 U/L (ref 0–35)
AST: 19 U/L (ref 0–37)
Albumin: 4 g/dL (ref 3.5–5.2)
Alkaline Phosphatase: 60 U/L (ref 39–117)
BUN: 6 mg/dL (ref 6–23)
CO2: 32 mEq/L (ref 19–32)
Calcium: 8.8 mg/dL (ref 8.4–10.5)
Chloride: 101 mEq/L (ref 96–112)
Creatinine, Ser: 0.84 mg/dL (ref 0.40–1.20)
GFR: 75.94 mL/min (ref >60.00)
Glucose, Bld: 77 mg/dL (ref 70–99)
Potassium: 3.7 mEq/L (ref 3.5–5.1)
Sodium: 139 mEq/L (ref 135–145)
Total Bilirubin: 0.4 mg/dL (ref 0.2–1.2)
Total Protein: 6.5 g/dL (ref 6.0–8.3)

## 2022-11-12 LAB — LIPASE: Lipase: 33 U/L (ref 11.0–59.0)

## 2022-11-12 MED ORDER — ESOMEPRAZOLE MAGNESIUM 40 MG PO CPDR: 40.0000 mg | DELAYED_RELEASE_CAPSULE | Freq: Every day | ORAL | 4 refills | Status: DC

## 2022-11-12 MED ORDER — GI COCKTAIL ~~LOC~~: 30.0000 mL | Freq: Two times a day (BID) | ORAL | 0 refills | Status: DC

## 2022-11-12 NOTE — Progress Notes (Signed)
Chief Complaint:   Referring Provider:  Nicoletta Dress, MD      ASSESSMENT AND PLAN;   #1. PPI responsive EoE with GERD/NCCP. Dx 05/2014, treated with PPIs and Flovent, did very well on food restriction.  S/P EGD with dil 75IE 12/27/2949 complicated by UGI bleed.  #2. Epi pain. S/P cholecystectomy in past.  #3. H/O diverticulitis, treated with antibiotics 11/2018. Has mod-severe sigmoid div with possible muscular hypertrophy/stenosis.  #4. IBS-C. Next colon due 09/2023 with 2 day prep.  #5.  Liver cyst/R kidney angiomyolipoma.  Plan - Continue nexium 40 mg p.o. QD #90 4RF. - CBC, CMP, lipase - CT AP with contrast for epi pain/FU liver cyst and right kidney angiomyolipoma. - GI cocktail as before - Avoid diary, eggs, potatoes, chocolate and caffeine as suggested by Dr. Neldon Mc. - Continue high-fiber diet.  If still with constipation, start MiraLAX. - Recall colonoscopy 09/2023 with 2-day prep.   HPI:    Sherri Ruiz is a 62 y.o. female   FU Overall doing well. Had mild epigastric pain after eating x 2 weeks.  Note that she had cholecystectomy in past.  Mostly related to coughing.  GI cocktail does help.  Denies having any nausea or vomiting.  No further dysphagia.  Has longstanding history of constipation which is better with high-fiber diet.  Was quite concerned about renal angiomyolipoma R and liver cyst.   Underwent EGD with dil (88CZ) complicated by UGI bleeding, requiring hospitalization, 2U. Hb never dropped down much.  Discharge hemoglobin was 11.9.  She had hemoglobin checked a week later which has come up to 13.4   No melena or hematochezia.  No weight loss.    From previous notes: Took a brief course of Flovent, then had oral thrush requiring nystatin.  She has stopped taking Flovent.  She is on restricted diet as suggested by Dr. Neldon Mc.  She tried reducing Nexium to 20 mg p.o. once a day but then had breakthrough heartburn.  She is back on 40 mg p.o.  once a day.  Doing well.  No odynophagia or dysphagia.  Rare breakthrough symptoms controlled by GI cocktail/Mylanta.  Stress triggers attacks as well.  Has been seeing a counselor  We had gone over previous CT Abdo/pelvis.  Complex cyst in the liver.  She does want to hold off on MRI at the present time.  Oct 30, 2020 - had covid  Wt Readings from Last 3 Encounters:  11/12/22 135 lb (61.2 kg)  02/19/22 134 lb (60.8 kg)  07/29/21 126 lb (57.2 kg)     Does admit that she has been under considerable stress due to family problems (daughters with the grandkids had moved in.  At one point she had 11 people living in her home, she also recently has been promoted in her job).  1 daughter is a Marine scientist at surgical floor [name].  Past GI Proc:  EGD with dil 06/27/2021 - Few circular rings in the proximal and distal esophagus without any obvious stricture. Dilated. BX- NEG for EoE - Small hiatal hernia. - Gastritis. Bx- neg for HP - Multiple gastric polyps. Resected and retrieved x 3. Bx-fundic gland polyps - Complicated by UGI bleeding req hospitalization, 2U    EGD 03/21/2020  -Few circular rings in the distal esophagus (bx-eosinophilic esophagitis Eos> 30 per high-power field) -Small transient hiatal hernia. -Multiple gastric polyps. Bx- fundic gland polyps. -Mild gastritis. Biopsied.  EGD 05/2014-wide-open cervical esophageal web, mild gastritis, gastric polyps.  Eso Bx-  showed increased eosinophils over 20 per HPF c/w eosinophilic esophagitis.  Gastric Bx- reactive gastropathy, intestinal metaplasia.  Subsequent EGD 5/0/9326-[id number] of eosinophilic esophagitis on biopsies, gastric mapping negative for intestinal metaplasia, small incidental gastric polyps measuring 4 to 6 mm.  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  Otherwise normal to TI..  10/07/2013-good prep, moderate sigmoid diverticulosis, small internal hemorrhoids.  Otherwise normal to TI.  Next due 09/2023 unless with any new problems. 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 - CT AP 02/2019: IMPRESSION: 1. No acute findings in the abdomen or pelvis. Relatively advanced left colonic diverticulosis without diverticulitis. 2. 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 3. The 16 mm low-density lesion in the anterior right liver may be a complex cysts, but cannot be definitively characterized. MRI of the abdomen with and without contrast recommended to further evaluate. She wanted to hold off on MRI Past Medical History:  Diagnosis Date   Allergic rhinitis    Allergy    Anxiety    OCC    Asthma    none since on Singulair started    Cataract    removed right eye, left forming    Congenital clotting factor deficiency (HCC)    Eosinophilic esophagitis    GERD (gastroesophageal reflux disease)    High blood pressure    High cholesterol    History of hepatitis A    Hypothyroidism    IBS (irritable bowel syndrome)    IDA (iron deficiency anemia)    LPRD (laryngopharyngeal reflux disease)    Penicillin allergy    Peripheral neuropathy    PONV (postoperative nausea and vomiting)    Due to Demerol    Vitamin B12 deficiency anemia     Past Surgical History:  Procedure Laterality Date   CATARACT EXTRACTION  03/2020   CHOLECYSTECTOMY     COLONOSCOPY     ESOPHAGOGASTRODUODENOSCOPY  06/16/2014   Wide open cervial esophageal web. Mild gastrtitis. Gastric polyps.    ESOPHAGOGASTRODUODENOSCOPY  12/25/2017   ESOPHAGOGASTRODUODENOSCOPY  06/27/2021   OTHER SURGICAL HISTORY     Hysterectomy    TOTAL ABDOMINAL HYSTERECTOMY     UPPER GASTROINTESTINAL ENDOSCOPY      Family History  Problem Relation Age of Onset   Clotting disorder Mother    High Cholesterol Mother    Asthma Mother    Stroke Mother    High blood pressure Father    Heart attack Father 33       [place].  Atrial fib.     Colon polyps Father    Cancer Sister    Asthma Daughter    Heart disease Brother        Pacemaker   Stomach cancer Maternal Aunt    Esophageal cancer Other    Colon cancer Neg Hx    Rectal cancer Neg Hx     Social History   Tobacco Use   Smoking status: Former    Types: Cigarettes    Quit date: 1989    Years since quitting: 35.0   Smokeless tobacco: Never  Vaping Use   Vaping Use: Never used  Substance Use Topics   Alcohol use: Not Currently  Drug use: Never    Current Outpatient Medications  Medication Sig Dispense Refill   albuterol (VENTOLIN HFA) 108 (90 Base) MCG/ACT inhaler SMARTSIG:2 Puff(s) By Mouth 4 Times Daily PRN     ALPRAZolam (XANAX) 0.25 MG tablet Take 0.25 mg by mouth 2 (two) times daily as needed.     atorvastatin (LIPITOR) 20 MG tablet Take 20 mg by mouth at bedtime.   1   Cetirizine HCl (ZYRTEC PO) Take 1 tablet by mouth daily.      esomeprazole (NEXIUM) 40 MG capsule Take 1 capsule (40 mg total) by mouth daily. Call [phone] to schedule an office visit for more refills 90 capsule 1   levothyroxine (SYNTHROID) 25 MCG tablet Take 25 mcg by mouth daily.     lisinopril (PRINIVIL,ZESTRIL) 5 MG tablet Take 5 mg by mouth daily.      MINIVELLE 0.1 MG/24HR patch Place 1 patch onto the skin once a week. Friday     montelukast (SINGULAIR) 10 MG tablet TAKE 1 TABLET BY MOUTH EVERYDAY AT BEDTIME 90 tablet 1   Multiple Vitamins-Minerals (HAIR SKIN AND NAILS FORMULA PO) Take 1 tablet by mouth daily.     Multiple Vitamins-Minerals (MULTIVITAMIN PO) Take 1 tablet by mouth daily.     Nutritional Supplements (NUTRITIONAL SUPPLEMENT PO) Take by mouth. Vitamin D3 +  Zinc + Vitamin C     Probiotic Product (DIGESTIVE ADVANTAGE PO) Take 1 tablet by mouth daily.     Simethicone (GAS-X PO) Take 1 tablet by mouth 3 (three) times daily as needed.     Alum & Mag Hydroxide-Simeth (GI COCKTAIL) SUSP suspension Take 30 mLs by mouth 2 (two) times daily. Shake well. (Patient not taking: Reported on 11/12/2022) 240 mL 0   AMBULATORY NON FORMULARY MEDICATION Medication Name: GI Cocktail (Equal amounts of Maalox, Viscous Lidocaine, Bentyl)  Take 10 cc by mouth every 8 hours as needed. (Patient not taking: Reported on 11/12/2022) 120 mL 2   No current facility-administered medications for this visit.    Allergies  Allergen Reactions   Clindamycin/Lincomycin Swelling   Meperidine Nausea And Vomiting    unknown   Penicillin G Other (See Comments)    Unknown/childhood allergy Has patient had a PCN reaction causing immediate rash, facial/tongue/throat swelling, SOB or lightheadedness with hypotension: Yes Has patient had a PCN reaction causing severe rash involving mucus membranes or skin necrosis: Unknown Has patient had a PCN reaction that required hospitalization: No Has patient had a PCN reaction occurring within the last 10 years: Unknown If all of the above answers are "NO", then may proceed with Cephalosporin use.     Ativan [Lorazepam] Other (See Comments)    Poor clearance, memory loss    Review of Systems:  Negative except for HPI     Physical Exam:    Today's Vitals   11/12/22 0826  BP: 128/78  Pulse: 82  SpO2: 100%  Weight: 135 lb (61.2 kg)  Height: 5' 2.4" (1.585 m)   Body mass index is 24.38 kg/m. Constitutional:  Well-developed, in no acute distress. Psychiatric: Normal mood and affect. Behavior is normal. HEENT: Pupils normal.  Conjunctivae are normal. No scleral icterus. Cardiovascular: Normal rate, regular rhythm. No edema Pulmonary/chest: Effort normal and breath sounds normal. No wheezing, rales or rhonchi. Abdominal: Soft,  nondistended.  Mild epigastric/left upper quadrant tenderness. Bowel sounds active throughout. There are no masses palpable. No hepatomegaly. Neurological: Alert and oriented to person place and time. Skin: Skin is warm and dry. No rashes noted.  Data Reviewed: I have personally reviewed following labs and imaging studies  CBC:    Latest Ref Rng & Units 06/30/2021    4:14 AM 06/29/2021    1:56 AM 06/28/2021    6:16 PM  CBC  WBC 4.0 - 10.5 K/uL 4.4  4.9    Hemoglobin 12.0 - 15.0 g/dL 11.9  11.9  12.8   Hematocrit 36.0 - 46.0 % 35.2  35.3  37.6   Platelets 150 - 400 K/uL 165  174      CMP:    Latest Ref Rng & Units 06/30/2021    4:14 AM 06/29/2021    1:56 AM 06/28/2021    1:27 AM  CMP  Glucose 70 - 99 mg/dL 103  96  107   BUN 6 - 20 mg/dL '7  7  31   '$ Creatinine 0.44 - 1.00 mg/dL 0.75  0.72  0.71   Sodium 135 - 145 mmol/L 143  139  141   Potassium 3.5 - 5.1 mmol/L 3.4  3.6  3.6   Chloride 98 - 111 mmol/L 113  110  109   CO2 22 - 32 mmol/L '26  25  24   '$ Calcium 8.9 - 10.3 mg/dL 8.4  7.5  8.0   Total Protein 6.5 - 8.1 g/dL   5.7   Total Bilirubin 0.3 - 1.2 mg/dL   1.1   Alkaline Phos 38 - 126 U/L   33   AST 15 - 41 U/L   21   ALT 0 - 44 U/L   17       Carmell Austria, MD 11/12/2022, 8:50 AM  Cc: Nicoletta Dress, MD

## 2022-11-12 NOTE — Patient Instructions (Signed)
_______________________________________________________  If your blood pressure at your visit was 140/90 or greater, please contact your primary care physician to follow up on this.  _______________________________________________________  If you are age 60 or older, your body mass index should be between 23-30. Your Body mass index is 24.38 kg/m. If this is out of the aforementioned range listed, please consider follow up with your Primary Care Provider.  If you are age 44 or younger, your body mass index should be between 19-25. Your Body mass index is 24.38 kg/m. If this is out of the aformentioned range listed, please consider follow up with your Primary Care Provider.   ________________________________________________________  The [facility] providers would like to encourage you to use [facility] to communicate with providers for non-urgent requests or questions.  Due to long hold times on the telephone, sending your provider a message by [facility] may be a faster and more efficient way to get a response.  Please allow 48 business hours for a response.  Please remember that this is for non-urgent requests.  _______________________________________________________  Your provider has requested that you go to the basement level for lab work before leaving today. Press "B" on the elevator. The lab is located at the first door on the left as you exit the elevator.  Repeat colonoscopy for December 2024 with a 2 day prep. Please call 2 months prior to schedule this. A letter will be sent as it gets closer.  Continue Nexium. A script has been sent to your mail order [facility]'s information is below: Address: [address], [place], [place] [address]  [phone] for GI cocktail  script has been to you  *Please DO NOT go directly from our office to pick up this medication! Give the pharmacy 1 day to process the prescription as this is compounded and takes time to  make.  Avoid dairy eggs potatoes chocolate and caffeine.  You have been scheduled for a CT scan of the abdomen and pelvis at [facility] ([place], [place], [place] [address]).   You are scheduled on 11/20/2022 at 3:30pm. You should arrive by 1:15pm for your appointment time for registration. Please follow the written instructions below on the day of your exam:  WARNING: IF YOU ARE ALLERGIC TO IODINE/X-RAY DYE, PLEASE NOTIFY RADIOLOGY IMMEDIATELY AT [phone]! YOU WILL BE GIVEN A 13 HOUR PREMEDICATION PREP.  1) Do not eat or drink anything after 11:30am (4 hours prior to your test) 2) You have been given 2 bottles of oral contrast to drink. The solution may taste better if refrigerated, but do NOT add ice or any other liquid to this solution. Shake well before drinking.    Drink 1 bottle of contrast @ 1:30pm (2 hours prior to your exam)  Drink 1 bottle of contrast @ 2:30pm (1 hour prior to your exam)  You may take any medications as prescribed with a small amount of water, if necessary. If you take any of the following medications: METFORMIN, GLUCOPHAGE, GLUCOVANCE, AVANDAMET, RIOMET, FORTAMET, Emerson MET, JANUMET, GLUMETZA or METAGLIP, you MAY be asked to HOLD this medication 48 hours AFTER the exam.  The purpose of you drinking the oral contrast is to aid in the visualization of your intestinal tract. The contrast solution may cause some diarrhea. Depending on your individual set of symptoms, you may also receive an intravenous injection of x-ray contrast/dye. Plan on being at [facility] for 30 minutes or longer, depending on the type of  exam you are having performed.  This test typically takes 30-45 minutes to complete.  If you have any questions regarding your exam or if you need to reschedule, you may call the CT department at [phone] between the hours of 8:00 am and 5:00 pm,  Monday-Friday.  ________________________________________________________________________  Thank you,  Dr. Jackquline Denmark

## 2022-11-14 ENCOUNTER — Telehealth: Payer: Self-pay | Admitting: Gastroenterology

## 2022-11-14 ENCOUNTER — Other Ambulatory Visit: Payer: Self-pay

## 2022-11-14 MED ORDER — AMBULATORY NON FORMULARY MEDICATION: 2 refills | Status: DC

## 2022-11-14 NOTE — Telephone Encounter (Signed)
Spoke to husband and they are aware correct order is at [facility] and they will call once it is ready.

## 2022-11-14 NOTE — Telephone Encounter (Signed)
Inbound call from patient returning call. Please advise. 

## 2022-11-14 NOTE — Telephone Encounter (Signed)
Patient called wanting results for her lab work. Patient is also requesting if she needs a CT, she states she had a CT scan with her PCP in December and is wondering if she would still need to repeat one. Please advise

## 2022-11-14 NOTE — Telephone Encounter (Signed)
Patients spouse called to follow up on the GI cocktail for the patient.

## 2022-11-14 NOTE — Telephone Encounter (Signed)
Pt requesting recent results from labs. Chart reviewed. Pt notified:  Pt questions if she needs to have another CT scan done. Pt currently scheduled for Ct abdomen  and pelvis on 11/20/2022: Pt stated that she had a CT of Abdomen  ordered by her PCP back on Oct 4th at [facility]:  Fax was sent to [facility] requesting recent CT report to be faxed to our office: Please advise

## 2022-11-17 NOTE — Telephone Encounter (Signed)
Pt calling to see if Dr. Lyndel Safe stated if she need the CT scan or not that was ordered.  Please see notes below and advise:

## 2022-11-17 NOTE — Telephone Encounter (Signed)
Left message for pt to call back

## 2022-11-17 NOTE — Telephone Encounter (Signed)
Patient returned call, request phone call after 2pm.

## 2022-11-17 NOTE — Telephone Encounter (Signed)
Patient requesting to speak with nurse. Please advise.

## 2022-11-19 NOTE — Telephone Encounter (Signed)
She does need CT Abdo/pelvis as per previous OV note, with contrast RG

## 2022-11-20 ENCOUNTER — Ambulatory Visit (HOSPITAL_COMMUNITY): Payer: Federal, State, Local not specified - PPO

## 2022-11-20 NOTE — Telephone Encounter (Signed)
Pt aware that Dr. Lyndel Safe did want her to have CT scan. She is aware and states she cancelled the scan as it was for today. Discussed with pt that if she wishes to proceed with the CT scan she can call [facility] back to reschedule the scan, she verbalized understanding.

## 2022-12-04 DIAGNOSIS — R0609 Other forms of dyspnea: Secondary | ICD-10-CM | POA: Diagnosis not present

## 2022-12-04 DIAGNOSIS — W540XXA Bitten by dog, initial encounter: Secondary | ICD-10-CM | POA: Diagnosis not present

## 2022-12-04 DIAGNOSIS — S0185XA Open bite of other part of head, initial encounter: Secondary | ICD-10-CM | POA: Diagnosis not present

## 2022-12-04 DIAGNOSIS — R21 Rash and other nonspecific skin eruption: Secondary | ICD-10-CM | POA: Diagnosis not present

## 2022-12-08 DIAGNOSIS — E039 Hypothyroidism, unspecified: Secondary | ICD-10-CM | POA: Diagnosis not present

## 2022-12-08 DIAGNOSIS — K219 Gastro-esophageal reflux disease without esophagitis: Secondary | ICD-10-CM | POA: Diagnosis not present

## 2022-12-08 DIAGNOSIS — I10 Essential (primary) hypertension: Secondary | ICD-10-CM | POA: Diagnosis not present

## 2022-12-08 DIAGNOSIS — R7301 Impaired fasting glucose: Secondary | ICD-10-CM | POA: Diagnosis not present

## 2022-12-08 DIAGNOSIS — E785 Hyperlipidemia, unspecified: Secondary | ICD-10-CM | POA: Diagnosis not present

## 2022-12-10 ENCOUNTER — Telehealth: Payer: Self-pay

## 2022-12-10 NOTE — Patient Outreach (Signed)
  Care Coordination   12/10/2022 Name: Hayley Horn MRN: [id number] DOB: 1961/06/07   Care Coordination Outreach Attempts:  An unsuccessful telephone outreach was attempted today to offer the patient information about available care coordination services as a benefit of their health plan.   Follow Up Plan:  Additional outreach attempts will be made to offer the patient care coordination information and services.   Encounter Outcome:  No Answer   Care Coordination Interventions:  No, not indicated    Tomasa Rand, RN, BSN, [facility] [employer] [phone]

## 2022-12-16 ENCOUNTER — Telehealth: Payer: Self-pay

## 2022-12-16 NOTE — Patient Outreach (Signed)
  Care Coordination   12/16/2022 Name: Sherri Ruiz MRN: [id number] DOB: 1961/05/04   Care Coordination Outreach Attempts:  A second unsuccessful outreach was attempted today to offer the patient with information about available care coordination services as a benefit of their health plan.     Follow Up Plan:  Additional outreach attempts will be made to offer the patient care coordination information and services.   Encounter Outcome:  No Answer   Care Coordination Interventions:  No, not indicated    Tomasa Rand, RN, BSN, [facility] [employer] [phone]

## 2022-12-22 ENCOUNTER — Telehealth: Payer: Self-pay

## 2022-12-22 NOTE — Patient Outreach (Signed)
  Care Coordination   12/22/2022 Name: Sherri Ruiz MRN: [id number] DOB: [birth year]   Care Coordination Outreach Attempts:  A third unsuccessful outreach was attempted today to offer the patient with information about available care coordination services as a benefit of their health plan.   Follow Up Plan:  No further outreach attempts will be made at this time. We have been unable to contact the patient to offer or enroll patient in care coordination services  Encounter Outcome:  No Answer   Care Coordination Interventions:  No, not indicated    Tomasa Rand, RN, BSN, [facility] [place] Coordinator [phone]

## 2022-12-31 DIAGNOSIS — J302 Other seasonal allergic rhinitis: Secondary | ICD-10-CM | POA: Diagnosis not present

## 2022-12-31 DIAGNOSIS — J45901 Unspecified asthma with (acute) exacerbation: Secondary | ICD-10-CM | POA: Diagnosis not present

## 2023-01-12 DIAGNOSIS — F411 Generalized anxiety disorder: Secondary | ICD-10-CM | POA: Diagnosis not present

## 2023-02-09 ENCOUNTER — Other Ambulatory Visit: Payer: Self-pay | Admitting: Allergy and Immunology

## 2023-02-16 ENCOUNTER — Ambulatory Visit: Payer: Federal, State, Local not specified - PPO | Admitting: Allergy and Immunology

## 2023-02-16 ENCOUNTER — Encounter: Payer: Self-pay | Admitting: Allergy and Immunology

## 2023-02-16 VITALS — BP 130/72 | HR 90 | Resp 16 | Ht 62.4 in | Wt 134.2 lb

## 2023-02-16 DIAGNOSIS — K219 Gastro-esophageal reflux disease without esophagitis: Secondary | ICD-10-CM | POA: Diagnosis not present

## 2023-02-16 DIAGNOSIS — J4530 Mild persistent asthma, uncomplicated: Secondary | ICD-10-CM

## 2023-02-16 DIAGNOSIS — J3089 Other allergic rhinitis: Secondary | ICD-10-CM

## 2023-02-16 DIAGNOSIS — K200 Eosinophilic esophagitis: Secondary | ICD-10-CM | POA: Diagnosis not present

## 2023-02-16 MED ORDER — MONTELUKAST SODIUM 10 MG PO TABS: ORAL_TABLET | ORAL | 2 refills | Status: DC

## 2023-02-16 NOTE — Progress Notes (Unsigned)
[place] - [place] - [place] - [place] - [place]   Follow-up Note  Referring Provider: Paulina Fusi, MD Primary Provider: Paulina Fusi, MD Date of Office Visit: 02/16/2023  Subjective:   Sherri Ruiz (DOB: 1961/10/26) is a 62 y.o. female who [facility] in re-evaluation of the following:  HPI: Trevor returns to this clinic in evaluation of eosinophilic esophagitis, allergic rhinitis, LPR.  I last saw her in this clinic 19 February 2022.  Her airway issue has been doing pretty well although over the course of the past week she developed some issues with runny nose and stuffy nose and coughing.  She has a short acting bronchodilator that she rarely uses and she used it twice in the past week.  She is not sure it really helps her very much.  She does not have any associated fever or anosmia or ugly nasal discharge or chest pain although this event apparently also flared up her reflux.  She now has some discomfort when she swallows.  Prior to this event she thought that her airway was doing very well and she had very little problem with either her upper or lower airway while using Nasacort and montelukast.  She exercises twice a week for about 40 minutes doing a fast walk on a treadmill.  Sometimes when she goes upstairs and if she is carrying something heavy she will get short of breath.  She apparently had a "flareup" in October with some coughing and a sinus issue for which she was treated with an antibiotic and Kenalog from her primary care physician.  She was doing well with her reflux for the most part.  She could swallow without any difficulty and did not have much pain while using Nexium once a day.  As noted above since she has developed this respiratory tract issue she has had a little bit more problems with swallowing and heartburn.  She is adding in Mylanta to her Nexium.  Allergies as of 02/16/2023       Reactions    Clindamycin/lincomycin Swelling   Meperidine Nausea And Vomiting   unknown   Penicillin G Other (See Comments)   Unknown/childhood allergy Has patient had a PCN reaction causing immediate rash, facial/tongue/throat swelling, SOB or lightheadedness with hypotension: Yes Has patient had a PCN reaction causing severe rash involving mucus membranes or skin necrosis: Unknown Has patient had a PCN reaction that required hospitalization: No Has patient had a PCN reaction occurring within the last 10 years: Unknown If all of the above answers are "NO", then may proceed with Cephalosporin use.   Ativan [lorazepam] Other (See Comments)   Poor clearance, memory loss        Medication List    albuterol 108 (90 Base) MCG/ACT inhaler Commonly known as: VENTOLIN HFA SMARTSIG:2 Puff(s) By Mouth 4 Times Daily PRN   ALPRAZolam 0.25 MG tablet Commonly known as: XANAX Take 0.25 mg by mouth 2 (two) times daily as needed.   AMBULATORY NON FORMULARY MEDICATION Medication Name: GI Cocktail (Equal amounts of Maalox, Viscous Lidocaine, Bentyl)  Take 10 cc by mouth every 8 hours as needed.   atorvastatin 20 MG tablet Commonly known as: LIPITOR Take 20 mg by mouth at bedtime.   DIGESTIVE ADVANTAGE PO Take 1 tablet by mouth daily.   esomeprazole 40 MG capsule Commonly known as: NexIUM Take 1 capsule (40 mg total) by mouth daily.   GAS-X PO Take 1 tablet by mouth  3 (three) times daily as needed.   HAIR SKIN AND NAILS FORMULA PO Take 1 tablet by mouth daily.   levothyroxine 25 MCG tablet Commonly known as: SYNTHROID Take 25 mcg by mouth daily.   lisinopril 5 MG tablet Commonly known as: ZESTRIL Take 5 mg by mouth daily.   Minivelle 0.1 MG/24HR patch Generic drug: estradiol Place 1 patch onto the skin once a week. Friday   montelukast 10 MG tablet Commonly known as: SINGULAIR TAKE 1 TABLET BY MOUTH EVERYDAY AT BEDTIME   MULTIVITAMIN PO Take 1 tablet by mouth daily.   NUTRITIONAL  SUPPLEMENT PO Take by mouth. Vitamin D3 + Zinc + Vitamin C   ZYRTEC PO Take 1 tablet by mouth daily.    Past Medical History:  Diagnosis Date   Allergic rhinitis    Allergy    Anxiety    OCC    Asthma    none since on Singulair started    Cataract    removed right eye, left forming    Congenital clotting factor deficiency    Eosinophilic esophagitis    GERD (gastroesophageal reflux disease)    High blood pressure    High cholesterol    History of hepatitis A    Hypothyroidism    IBS (irritable bowel syndrome)    IDA (iron deficiency anemia)    LPRD (laryngopharyngeal reflux disease)    Penicillin allergy    Peripheral neuropathy    PONV (postoperative nausea and vomiting)    Due to Demerol    Vitamin B12 deficiency anemia     Past Surgical History:  Procedure Laterality Date   CATARACT EXTRACTION  03/2020   CHOLECYSTECTOMY     COLONOSCOPY     ESOPHAGOGASTRODUODENOSCOPY  06/16/2014   Wide open cervial esophageal web. Mild gastrtitis. Gastric polyps.    ESOPHAGOGASTRODUODENOSCOPY  12/25/2017   ESOPHAGOGASTRODUODENOSCOPY  06/27/2021   OTHER SURGICAL HISTORY     Hysterectomy   TOTAL ABDOMINAL HYSTERECTOMY     UPPER GASTROINTESTINAL ENDOSCOPY      Review of systems negative except as noted in HPI / PMHx or noted below:  Review of Systems  Constitutional: Negative.   HENT: Negative.    Eyes: Negative.   Respiratory: Negative.    Cardiovascular: Negative.   Gastrointestinal: Negative.   Genitourinary: Negative.   Musculoskeletal: Negative.   Skin: Negative.   Neurological: Negative.   Endo/Heme/Allergies: Negative.   Psychiatric/Behavioral: Negative.       Objective:   Vitals:   02/16/23 1017  BP: 130/72  Pulse: 90  Resp: 16  SpO2: 98%   Height: 5' 2.4" (158.5 cm)  Weight: 134 lb 3.2 oz (60.9 kg)   Physical Exam Constitutional:      Appearance: She is not diaphoretic.  HENT:     Head: Normocephalic.     Right Ear: Tympanic membrane, ear  canal and external ear normal.     Left Ear: Tympanic membrane, ear canal and external ear normal.     Nose: Nose normal. No mucosal edema or rhinorrhea.     Mouth/Throat:     Pharynx: Uvula midline. No oropharyngeal exudate.  Eyes:     Conjunctiva/sclera: Conjunctivae normal.  Neck:     Thyroid: No thyromegaly.     Trachea: Trachea normal. No tracheal tenderness or tracheal deviation.  Cardiovascular:     Rate and Rhythm: Normal rate and regular rhythm.     Heart sounds: Normal heart sounds, S1 normal and S2 normal. No murmur heard. Pulmonary:  Effort: No respiratory distress.     Breath sounds: Normal breath sounds. No stridor. No wheezing or rales.  Lymphadenopathy:     Head:     Right side of head: No tonsillar adenopathy.     Left side of head: No tonsillar adenopathy.     Cervical: No cervical adenopathy.  Skin:    Findings: No erythema or rash.     Nails: There is no clubbing.  Neurological:     Mental Status: She is alert.     Diagnostics: Spirometry was performed and demonstrated an FEV1 of 2.68 at 114 % of predicted.  Assessment and Plan:   1. Not well controlled mild persistent asthma   2. Other allergic rhinitis   3. LPRD (laryngopharyngeal reflux disease)   4. Eosinophilic esophagitis    1. Continue to avoid all dairy and egg consumption.    2. Continue to Treat inflammation:   A. OTC Nasacort 1 spray each nostril 1 time per day  B. montelukast 10 mg tablet 1 time per day  3. Continue to Treat reflux:   A. Nexium 40 mg tablet 1-2 times per day  B. OTC Mylanta / Maalox   4.  If needed:   A. Airsupra - 2 inhalations every 6-6 hours  B. OTC antihistamine   5. Return to clinic in one year  Jailah appears to have developed a viral respiratory tract infection over the course of the past week.  This very well could have been a issue tied up with springtime pollen exposure but it is relatively acute and is already improving somewhat.  I have given her  an anti-inflammatory rescue medicine to be used as needed to address any asthmatic component.  It iss interesting to note that her esophageal issue flared up with this event.  I have asked her to increase her Nexium to twice a day and hopefully this will resolve over the course of the next week or so.  Overall she has done pretty well with her plan of avoidance measures directed against egg and dairy and treating some inflammation of her airway and treating reflux/EOE with the plan noted above and assuming she continues to do well we will see her back in this clinic in 1 year or earlier if there is a problem.  Laurette Schimke, MD Allergy / Immunology  [facility]

## 2023-02-16 NOTE — Patient Instructions (Addendum)
  1. Continue to avoid all dairy and egg consumption.    2. Continue to Treat inflammation:   A. OTC Nasacort 1 spray each nostril 1 time per day  B. montelukast 10 mg tablet 1 time per day  3. Continue to Treat reflux:   A. Nexium 40 mg tablet 1-2 times per day  B. OTC Mylanta / Maalox   4.  If needed:   A. Airsupra - 2 inhalations every 6-6 hours  B. OTC antihistamine   5. Return to clinic in one year

## 2023-02-17 ENCOUNTER — Encounter: Payer: Self-pay | Admitting: Allergy and Immunology

## 2023-02-24 DIAGNOSIS — F419 Anxiety disorder, unspecified: Secondary | ICD-10-CM | POA: Diagnosis not present

## 2023-02-24 DIAGNOSIS — E039 Hypothyroidism, unspecified: Secondary | ICD-10-CM | POA: Diagnosis not present

## 2023-03-01 IMAGING — CR DG CHEST 2V
2 series · 2 of 2 positions shown · non-contrast
Comparison: 07/20/2018.

CLINICAL DATA: Chest pain.

EXAM:
CHEST - 2 VIEW

[chest pa]
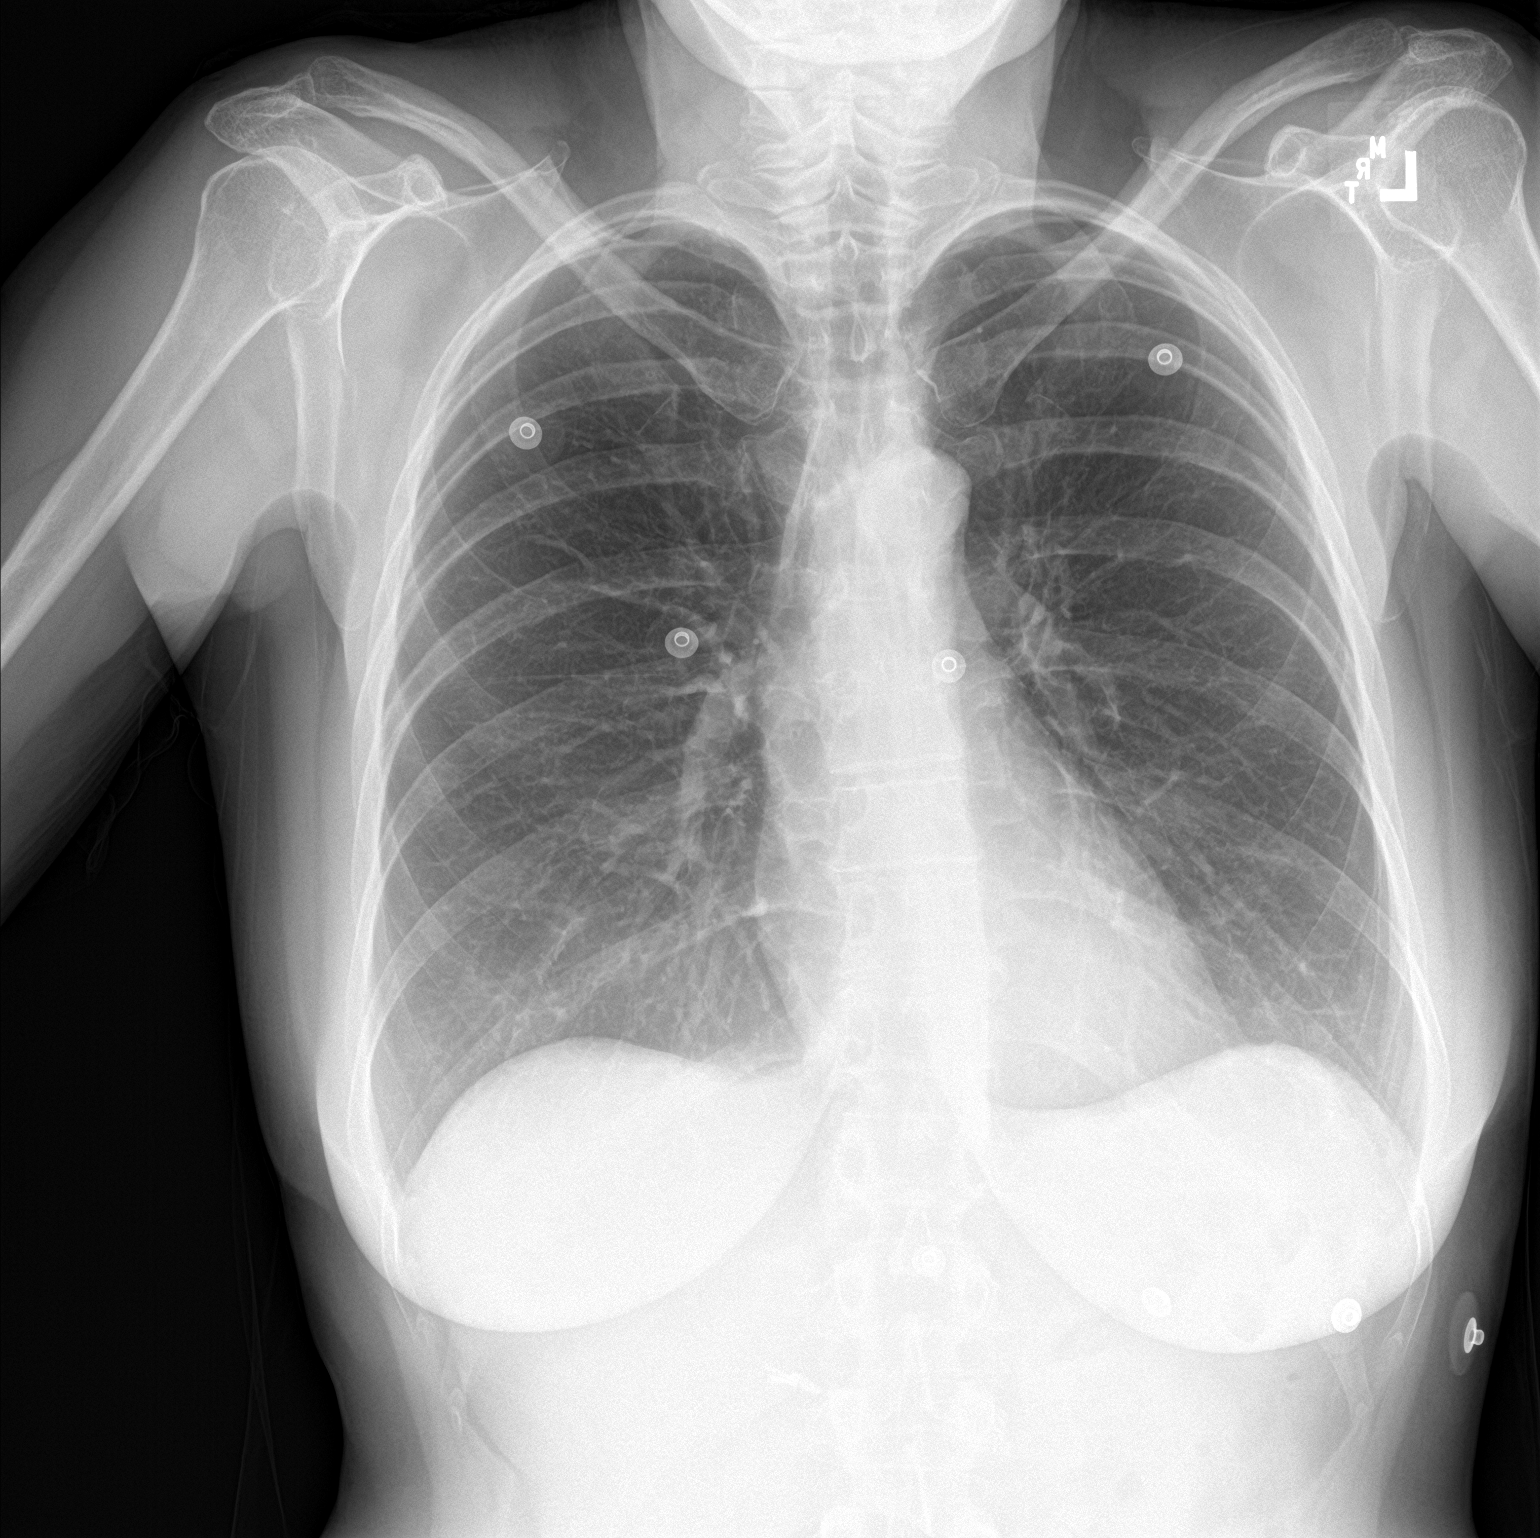

[chest lat]
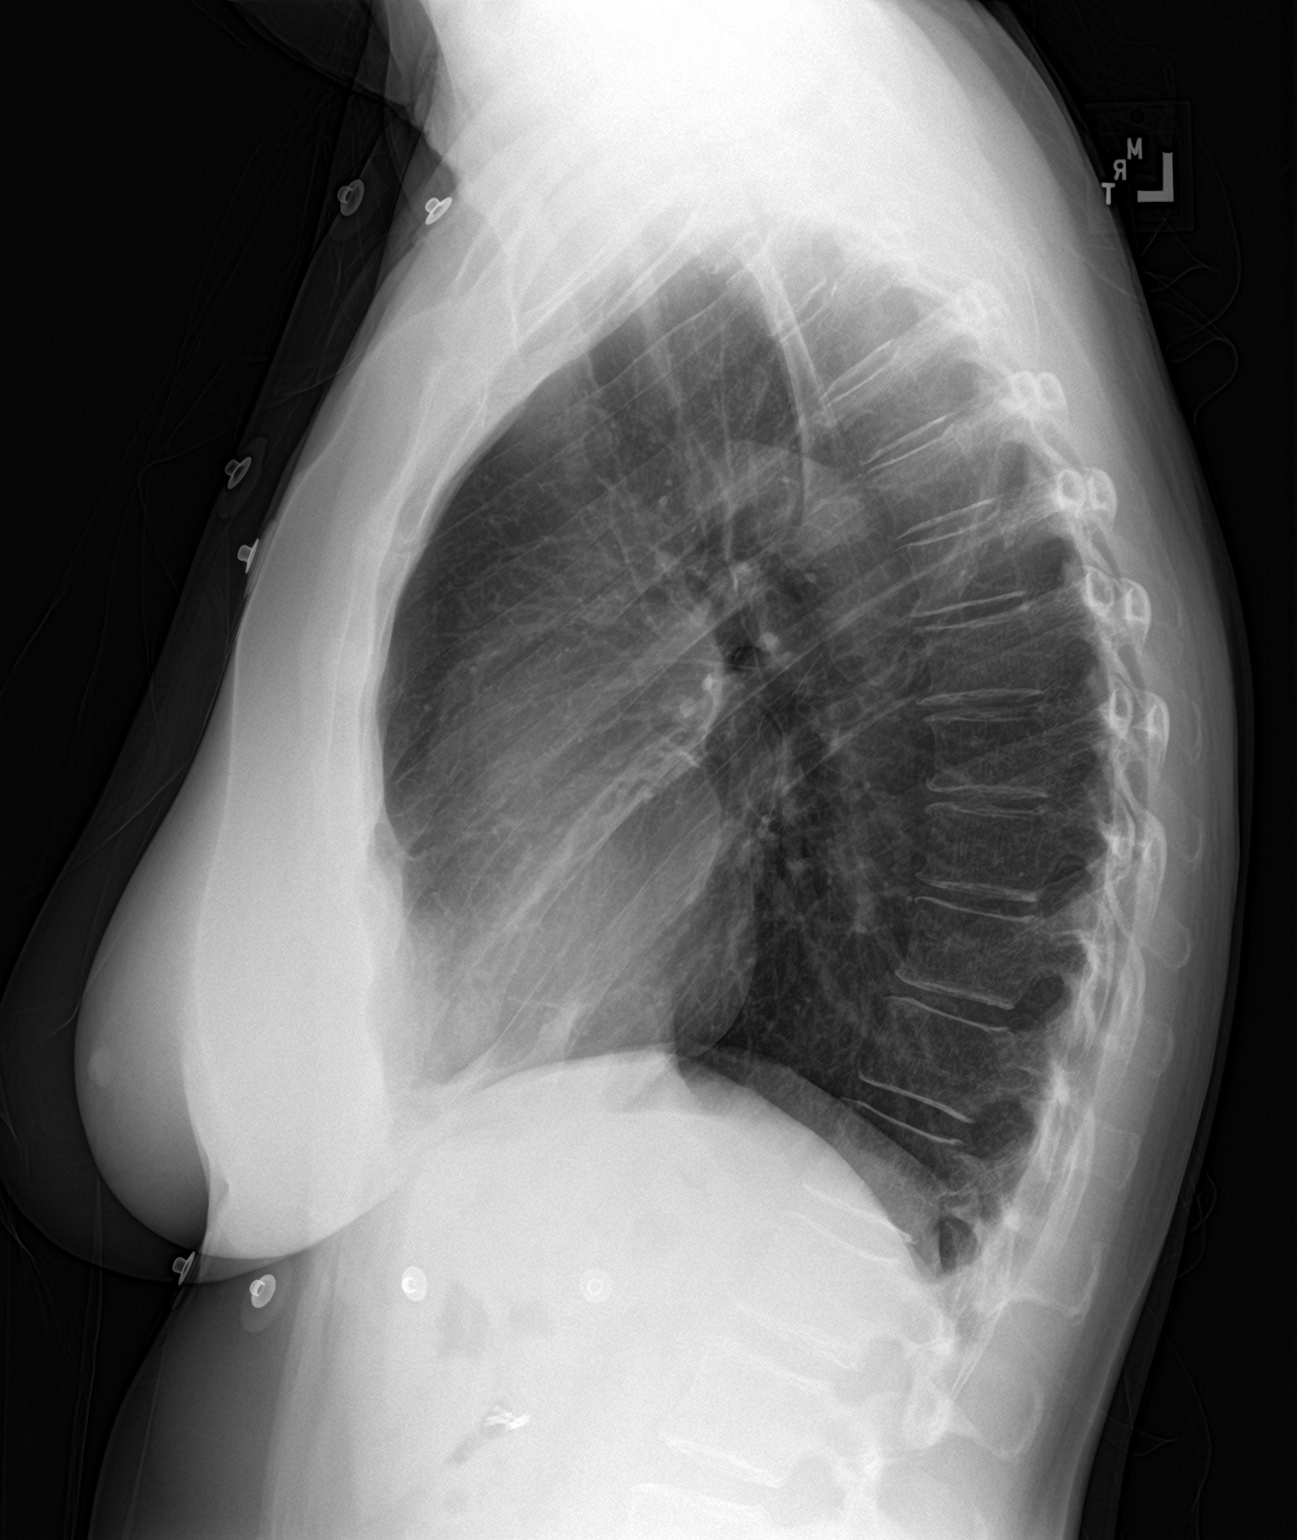

[2 of 2 positions shown; findings below may reference images not displayed]

FINDINGS: Cardiac silhouette is within normal limits. The lungs are well
inflated. No focal consolidation or mass. No pleural effusion or
pneumothorax.

Cholecystectomy clips.  No acute osseous abnormality.
IMPRESSION: Normal chest

## 2023-03-09 DIAGNOSIS — K222 Esophageal obstruction: Secondary | ICD-10-CM | POA: Diagnosis not present

## 2023-03-09 DIAGNOSIS — E785 Hyperlipidemia, unspecified: Secondary | ICD-10-CM | POA: Diagnosis not present

## 2023-03-09 DIAGNOSIS — R7301 Impaired fasting glucose: Secondary | ICD-10-CM | POA: Diagnosis not present

## 2023-03-09 DIAGNOSIS — E039 Hypothyroidism, unspecified: Secondary | ICD-10-CM | POA: Diagnosis not present

## 2023-03-09 DIAGNOSIS — K219 Gastro-esophageal reflux disease without esophagitis: Secondary | ICD-10-CM | POA: Diagnosis not present

## 2023-03-10 DIAGNOSIS — F419 Anxiety disorder, unspecified: Secondary | ICD-10-CM | POA: Diagnosis not present

## 2023-03-10 DIAGNOSIS — Z1231 Encounter for screening mammogram for malignant neoplasm of breast: Secondary | ICD-10-CM | POA: Diagnosis not present

## 2023-04-02 ENCOUNTER — Telehealth: Payer: Self-pay | Admitting: Gastroenterology

## 2023-04-02 NOTE — Telephone Encounter (Signed)
Pt stated that she was having severe chest pains, took a GI cocktail this at 6:45 AM, xanax at 6:45 AM Hyoscyamine at 7:00 AM, Maylox and Nexium at 12:20 with no relief. Pt was notified to go to urgent care or the ED for evaluation and treatment.  Pt verbalized understanding with all questions answered.

## 2023-04-02 NOTE — Telephone Encounter (Signed)
PT is having severe chest pains. She took gi cocktail at 645am, maalox and hyoscyamine at 7 and maalox with nexium at 12 and still no relief. Requesting callback

## 2023-04-03 MED ORDER — HYOSCYAMINE SULFATE 0.125 MG SL SUBL: 0.1250 mg | SUBLINGUAL_TABLET | SUBLINGUAL | 4 refills | Status: AC | PRN

## 2023-04-03 NOTE — Addendum Note (Signed)
Addended by: Alberteen Sam E on: 04/03/2023 11:51 AM   Modules accepted: Orders

## 2023-04-03 NOTE — Telephone Encounter (Signed)
Patient calling for medication refill on Levsin. Please advise.  Thank you

## 2023-04-03 NOTE — Telephone Encounter (Signed)
Sent medication in and told patient to call with any trouble and we will send in the regular tablets

## 2023-04-15 DIAGNOSIS — F419 Anxiety disorder, unspecified: Secondary | ICD-10-CM | POA: Diagnosis not present

## 2023-04-29 DIAGNOSIS — F419 Anxiety disorder, unspecified: Secondary | ICD-10-CM | POA: Diagnosis not present

## 2023-05-16 ENCOUNTER — Other Ambulatory Visit (HOSPITAL_COMMUNITY): Payer: Self-pay

## 2023-05-16 ENCOUNTER — Telehealth: Payer: Self-pay | Admitting: Pharmacy Technician

## 2023-05-16 NOTE — Telephone Encounter (Signed)
Pharmacy Patient Advocate Encounter  Received notification from [facility] that Prior Authorization for ESOMEPRAZOLE 40MG  has been APPROVED from 6.20.24 to 7.20.25.Marland Kitchen  PA #/Case ID/Reference #: [id number]  Spoke with Pharmacy to process. Copay is $22.50    Received notification from CoverMyMeds that prior authorization for PANTOPRAZOLE 40MG  is required/requested.   Insurance verification completed.   The patient is insured through [facility] .   Per test claim: PA submitted to [facility] via CoverMyMeds Key/confirmation #/[id number] Status is pending

## 2023-05-22 DIAGNOSIS — F419 Anxiety disorder, unspecified: Secondary | ICD-10-CM | POA: Diagnosis not present

## 2023-06-02 DIAGNOSIS — F419 Anxiety disorder, unspecified: Secondary | ICD-10-CM | POA: Diagnosis not present

## 2023-06-05 DIAGNOSIS — R509 Fever, unspecified: Secondary | ICD-10-CM | POA: Diagnosis not present

## 2023-06-05 DIAGNOSIS — J0110 Acute frontal sinusitis, unspecified: Secondary | ICD-10-CM | POA: Diagnosis not present

## 2023-06-25 DIAGNOSIS — K219 Gastro-esophageal reflux disease without esophagitis: Secondary | ICD-10-CM | POA: Diagnosis not present

## 2023-06-25 DIAGNOSIS — K222 Esophageal obstruction: Secondary | ICD-10-CM | POA: Diagnosis not present

## 2023-06-25 DIAGNOSIS — E039 Hypothyroidism, unspecified: Secondary | ICD-10-CM | POA: Diagnosis not present

## 2023-06-25 DIAGNOSIS — E785 Hyperlipidemia, unspecified: Secondary | ICD-10-CM | POA: Diagnosis not present

## 2023-06-25 DIAGNOSIS — R7301 Impaired fasting glucose: Secondary | ICD-10-CM | POA: Diagnosis not present

## 2023-07-07 DIAGNOSIS — Z01419 Encounter for gynecological examination (general) (routine) without abnormal findings: Secondary | ICD-10-CM | POA: Diagnosis not present

## 2023-07-07 DIAGNOSIS — Z90710 Acquired absence of both cervix and uterus: Secondary | ICD-10-CM | POA: Diagnosis not present

## 2023-07-21 DIAGNOSIS — F419 Anxiety disorder, unspecified: Secondary | ICD-10-CM | POA: Diagnosis not present

## 2023-08-06 ENCOUNTER — Telehealth: Payer: Self-pay | Admitting: Gastroenterology

## 2023-08-06 NOTE — Telephone Encounter (Signed)
Inbound call from patient requesting to speak about Nexium medication. Patient is concerned about long term effects of medication. Patient requesting a call back. Please advise, thank you.

## 2023-08-07 NOTE — Telephone Encounter (Signed)
Protonix is pretty much similar to Nexium She can try it every other day Let us know how she is in 4 to 6 weeks. RG

## 2023-08-07 NOTE — Telephone Encounter (Signed)
Spoke with patient regarding MD recommendations. Pt verbalized all understanding. 

## 2023-08-07 NOTE — Telephone Encounter (Signed)
Returned patient call & she has concerns about the long term side effects of Nexium. She's been on reflux medication for 22 years & symptoms are well controlled, however she'd like to know if she could potentially wean off PPI or discuss an alternative. A couple of family members take pantoprazole & she wasn't sure if that would be a better option. Last seen for OV 10/2022 with Dr. Chales Abrahams.

## 2023-08-10 DIAGNOSIS — S39011A Strain of muscle, fascia and tendon of abdomen, initial encounter: Secondary | ICD-10-CM | POA: Diagnosis not present

## 2023-08-10 DIAGNOSIS — K581 Irritable bowel syndrome with constipation: Secondary | ICD-10-CM | POA: Diagnosis not present

## 2023-08-11 DIAGNOSIS — F419 Anxiety disorder, unspecified: Secondary | ICD-10-CM | POA: Diagnosis not present

## 2023-08-25 DIAGNOSIS — F419 Anxiety disorder, unspecified: Secondary | ICD-10-CM | POA: Diagnosis not present

## 2023-09-09 DIAGNOSIS — F419 Anxiety disorder, unspecified: Secondary | ICD-10-CM | POA: Diagnosis not present

## 2023-10-10 DIAGNOSIS — J0100 Acute maxillary sinusitis, unspecified: Secondary | ICD-10-CM | POA: Diagnosis not present

## 2023-10-27 ENCOUNTER — Other Ambulatory Visit (INDEPENDENT_AMBULATORY_CARE_PROVIDER_SITE_OTHER): Payer: Federal, State, Local not specified - PPO

## 2023-10-27 ENCOUNTER — Encounter: Payer: Self-pay | Admitting: Gastroenterology

## 2023-10-27 ENCOUNTER — Ambulatory Visit: Payer: Federal, State, Local not specified - PPO | Admitting: Gastroenterology

## 2023-10-27 VITALS — BP 110/70 | HR 67 | Ht 62.0 in | Wt 136.0 lb

## 2023-10-27 DIAGNOSIS — K200 Eosinophilic esophagitis: Secondary | ICD-10-CM | POA: Diagnosis not present

## 2023-10-27 DIAGNOSIS — K219 Gastro-esophageal reflux disease without esophagitis: Secondary | ICD-10-CM | POA: Diagnosis not present

## 2023-10-27 DIAGNOSIS — Z8719 Personal history of other diseases of the digestive system: Secondary | ICD-10-CM

## 2023-10-27 DIAGNOSIS — R1013 Epigastric pain: Secondary | ICD-10-CM

## 2023-10-27 DIAGNOSIS — K581 Irritable bowel syndrome with constipation: Secondary | ICD-10-CM

## 2023-10-27 DIAGNOSIS — D1771 Benign lipomatous neoplasm of kidney: Secondary | ICD-10-CM

## 2023-10-27 DIAGNOSIS — K7689 Other specified diseases of liver: Secondary | ICD-10-CM

## 2023-10-27 DIAGNOSIS — R0789 Other chest pain: Secondary | ICD-10-CM

## 2023-10-27 LAB — CBC WITH DIFFERENTIAL/PLATELET
Basophils Absolute: 0 10*3/uL (ref 0.0–0.1)
Basophils Relative: 0.6 % (ref 0.0–3.0)
Eosinophils Absolute: 0.2 10*3/uL (ref 0.0–0.7)
Eosinophils Relative: 3.1 % (ref 0.0–5.0)
HCT: 37.1 % (ref 36.0–46.0)
Hemoglobin: 12.2 g/dL (ref 12.0–15.0)
Lymphocytes Relative: 25.3 % (ref 12.0–46.0)
Lymphs Abs: 1.3 10*3/uL (ref 0.7–4.0)
MCHC: 32.8 g/dL (ref 30.0–36.0)
MCV: 81.1 fL (ref 78.0–100.0)
Monocytes Absolute: 0.4 10*3/uL (ref 0.1–1.0)
Monocytes Relative: 7.7 % (ref 3.0–12.0)
Neutro Abs: 3.4 10*3/uL (ref 1.4–7.7)
Neutrophils Relative %: 63.3 % (ref 43.0–77.0)
Platelets: 315 10*3/uL (ref 150.0–400.0)
RBC: 4.57 Mil/uL (ref 3.87–5.11)
RDW: 16.4 % — ABNORMAL HIGH (ref 11.5–15.5)
WBC: 5.3 10*3/uL (ref 4.0–10.5)

## 2023-10-27 LAB — COMPREHENSIVE METABOLIC PANEL
ALT: 15 U/L (ref 0–35)
AST: 17 U/L (ref 0–37)
Albumin: 4.2 g/dL (ref 3.5–5.2)
Alkaline Phosphatase: 55 U/L (ref 39–117)
BUN: 11 mg/dL (ref 6–23)
CO2: 30 meq/L (ref 19–32)
Calcium: 8.8 mg/dL (ref 8.4–10.5)
Chloride: 101 meq/L (ref 96–112)
Creatinine, Ser: 0.82 mg/dL (ref 0.40–1.20)
GFR: 77.64 mL/min (ref >60.00)
Glucose, Bld: 101 mg/dL — ABNORMAL HIGH (ref 70–99)
Potassium: 4.1 meq/L (ref 3.5–5.1)
Sodium: 138 meq/L (ref 135–145)
Total Bilirubin: 0.6 mg/dL (ref 0.2–1.2)
Total Protein: 6.8 g/dL (ref 6.0–8.3)

## 2023-10-27 MED ORDER — NA SULFATE-K SULFATE-MG SULF 17.5-3.13-1.6 GM/177ML PO SOLN: 1.0000 | Freq: Once | ORAL | 0 refills | Status: AC

## 2023-10-27 MED ORDER — ESOMEPRAZOLE MAGNESIUM 20 MG PO CPDR: 20.0000 mg | DELAYED_RELEASE_CAPSULE | Freq: Every day | ORAL | 4 refills | Status: DC

## 2023-10-27 NOTE — Progress Notes (Signed)
 Chief Complaint: FU  Referring Provider:  Keren Vicenta BRAVO, MD      ASSESSMENT AND PLAN;   #1. PPI responsive EoE with GERD/NCCP. Dx 05/2014, treated with PPIs and Flovent , did very well on food restriction.  S/P EGD with dil 46Fr 06/27/2021 complicated by UGI bleed.  #2. Epi pain/LUQ pain. S/P cholecystectomy in past.  #3. H/O diverticulitis, treated with antibiotics 11/2018. Has mod-severe sigmoid div with possible muscular hypertrophy/stenosis.  #4. IBS-C. Next colon due 09/2023 with 2 day prep.  #5.  Liver cyst/R kidney angiomyolipoma.  Plan - Decrease nexium  20 mg p.o. QD #90 4RF. - CBC, CMP.  - CT AP with contrast for epi pain/FU liver cyst and right kidney angiomyolipoma. - Avoid diary, eggs, potatoes, chocolate and caffeine as suggested by Dr. Kozlow. - Recall colonoscopy feb/march 2025 with 2-day prep.   HPI:    Sherri Ruiz is a 62 y.o. female   Discussed the use of AI scribe software for clinical note transcription with the patient, who gave verbal consent to proceed.  History of Present Illness   The patient presents with changes in bowel habits, experiencing increased frequency from once to two or three times daily. This change has been ongoing and is not associated with any other gastrointestinal symptoms. The patient also reports a persistent pain in the left upper quadrant, described as a knot-like sensation. This pain was previously evaluated by another physician who attributed it to muscular origin.  The patient has a history of reflux, eosinophilic esophagitis, and nutcracker esophagus, and is on long-term Nexium  for stomach issues. They express concern about the long-term effects of Nexium  and are considering reducing the dosage or switching to Protonix .  The patient also reports daily nausea, more prominent in the mornings and sometimes after eating. They are unsure if the nausea is related to their Nexium  intake. The patient has a known allergy  to dairy,  eggs, potatoes, chocolate, and caffeine, but admits to not strictly adhering to dietary restrictions.  The patient has a history of a cyst in the liver and a spot on the kidney, both deemed benign. They also had a previous episode of gastrointestinal bleeding. The patient is due for a colonoscopy and has had difficulty with the prep in the past due to sensitivity to Miralax.       Wt Readings from Last 3 Encounters:  10/27/23 136 lb (61.7 kg)  02/16/23 134 lb 3.2 oz (60.9 kg)  11/12/22 135 lb (61.2 kg)    Prev notes: Does admit that she has been under considerable stress due to family problems (daughters with the grandkids had moved in.  At one point she had 11 people living in her home, she also recently has been promoted in her job).  1 daughter is a engineer, civil (consulting) at surgical floor [name].  Past GI Proc:  EGD with dil 06/27/2021 - Few circular rings in the proximal and distal esophagus without any obvious stricture. Dilated. BX- NEG for EoE - Small hiatal hernia. - Gastritis. Bx- neg for HP - Multiple gastric polyps. Resected and retrieved x 3. Bx-fundic gland polyps - Complicated by UGI bleeding req hospitalization, 2U    EGD 03/21/2020  -Few circular rings in the distal esophagus (bx-eosinophilic esophagitis Eos> 30 per high-power field) -Small transient hiatal hernia. -Multiple gastric polyps. Bx- fundic gland polyps. -Mild gastritis. Biopsied.  EGD 05/2014-wide-open cervical esophageal web, mild gastritis, gastric polyps.  Eso Bx- showed increased eosinophils over 20 per HPF c/w eosinophilic esophagitis.  Gastric Bx- reactive gastropathy, intestinal metaplasia.  Subsequent EGD 12/25/2017-resolution of eosinophilic esophagitis on biopsies, gastric mapping negative for intestinal metaplasia, small incidental gastric polyps measuring 4 to 6 mm.  EGD 2002 at [place] -esophagitis, dysphagia due to nutcracker esophagus s/p multiple EGDs with dilatations, 1991 at [place] , 1992 at [facility]  with dilation 50 Fr followed by manometry showing nutcracker esophagus, 1994 esophageal dilatation with 48 Fr -Colonoscopy 03/21/2020: Diminutive colonic polyp SP polypectomy, moderate to severe sigmoid diverticulosis with sigmoid stricture.  Otherwise normal to TI..  10/07/2013-good prep, moderate sigmoid diverticulosis, small internal hemorrhoids.  Otherwise normal to TI.  Next due 09/2023 unless with any new problems. 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 - CT AP 02/2019: IMPRESSION: 1. No acute findings in the abdomen or pelvis. Relatively advanced left colonic diverticulosis without diverticulitis. 2. 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 3. The 16 mm low-density lesion in the anterior right liver may be a complex cysts, but cannot be definitively characterized. MRI of the abdomen with and without contrast recommended to further evaluate. She wanted to hold off on MRI Past Medical History:  Diagnosis Date   Allergic rhinitis    Allergy     Anxiety    OCC    Asthma    none since on Singulair  started    Cataract    removed right eye, left forming    Congenital clotting factor deficiency (HCC)    Eosinophilic esophagitis    GERD (gastroesophageal reflux disease)    High blood pressure    High cholesterol    History of hepatitis A    Hypothyroidism    IBS (irritable bowel syndrome)    IDA (iron deficiency anemia)    LPRD (laryngopharyngeal reflux disease)    Penicillin allergy     Peripheral neuropathy    PONV (postoperative nausea and vomiting)    Due to Demerol    Vitamin B12 deficiency anemia     Past Surgical History:  Procedure Laterality Date   CATARACT EXTRACTION  03/2020   CHOLECYSTECTOMY     COLONOSCOPY     ESOPHAGOGASTRODUODENOSCOPY  06/16/2014   Wide open cervial esophageal web. Mild gastrtitis. Gastric polyps.    ESOPHAGOGASTRODUODENOSCOPY  12/25/2017    ESOPHAGOGASTRODUODENOSCOPY  06/27/2021   OTHER SURGICAL HISTORY     Hysterectomy   TOTAL ABDOMINAL HYSTERECTOMY     UPPER GASTROINTESTINAL ENDOSCOPY      Family History  Problem Relation Age of Onset   Clotting disorder Mother    High Cholesterol Mother    Asthma Mother    Stroke Mother    High blood pressure Father    Heart attack Father 42       [place].  Atrial fib.     Colon polyps Father    Cancer Sister    Asthma Daughter    Heart disease Brother        Pacemaker   Stomach cancer Maternal Aunt    Esophageal cancer Other    Colon cancer Neg Hx    Rectal cancer Neg Hx     Social History   Tobacco Use   Smoking status: Former    Current packs/day: 0.00    Types: Cigarettes    Quit date: 1989    Years since quitting: 36.0   Smokeless tobacco: Never  Vaping Use   Vaping status: Never Used  Substance Use Topics   Alcohol use: Not Currently   Drug use: Never  Current Outpatient Medications  Medication Sig Dispense Refill   albuterol (VENTOLIN HFA) 108 (90 Base) MCG/ACT inhaler SMARTSIG:2 Puff(s) By Mouth 4 Times Daily PRN     AMBULATORY NON FORMULARY MEDICATION Medication Name: GI Cocktail (Equal amounts of Maalox, Viscous Lidocaine , Bentyl )  Take 10 cc by mouth every 8 hours as needed. 120 mL 2   atorvastatin  (LIPITOR) 20 MG tablet Take 20 mg by mouth at bedtime.   1   Cetirizine HCl (ZYRTEC PO) Take 1 tablet by mouth daily.      esomeprazole  (NEXIUM ) 40 MG capsule Take 1 capsule (40 mg total) by mouth daily. 90 capsule 4   hyoscyamine  (LEVSIN  SL) 0.125 MG SL tablet Place 1 tablet (0.125 mg total) under the tongue every 4 (four) hours as needed. 120 tablet 4   levothyroxine  (SYNTHROID ) 25 MCG tablet Take 25 mcg by mouth daily.     lisinopril  (PRINIVIL ,ZESTRIL ) 5 MG tablet Take 5 mg by mouth daily.      MINIVELLE 0.1 MG/24HR patch Place 1 patch onto the skin once a week. Friday     montelukast  (SINGULAIR ) 10 MG tablet TAKE 1 TABLET BY MOUTH EVERYDAY AT  BEDTIME 90 tablet 2   Multiple Vitamins-Minerals (HAIR SKIN AND NAILS FORMULA PO) Take 1 tablet by mouth daily.     Multiple Vitamins-Minerals (MULTIVITAMIN PO) Take 1 tablet by mouth daily.     Nutritional Supplements (NUTRITIONAL SUPPLEMENT PO) Take by mouth. Vitamin D3 + Zinc + Vitamin C     Probiotic Product (DIGESTIVE ADVANTAGE PO) Take 1 tablet by mouth daily.     Simethicone  (GAS-X PO) Take 1 tablet by mouth 3 (three) times daily as needed.     ALPRAZolam  (XANAX ) 0.25 MG tablet Take 0.25 mg by mouth 2 (two) times daily as needed. (Patient not taking: Reported on 10/27/2023)     No current facility-administered medications for this visit.    Allergies  Allergen Reactions   Clindamycin/Lincomycin Swelling   Meperidine Nausea And Vomiting    unknown   Penicillin G Other (See Comments)    Unknown/childhood allergy  Has patient had a PCN reaction causing immediate rash, facial/tongue/throat swelling, SOB or lightheadedness with hypotension: Yes Has patient had a PCN reaction causing severe rash involving mucus membranes or skin necrosis: Unknown Has patient had a PCN reaction that required hospitalization: No Has patient had a PCN reaction occurring within the last 10 years: Unknown If all of the above answers are NO, then may proceed with Cephalosporin use.     Ativan  [Lorazepam ] Other (See Comments)    Poor clearance, memory loss    Review of Systems:  Negative except for HPI     Physical Exam:    Today's Vitals   10/27/23 0934  BP: 110/70  Pulse: 67  Weight: 136 lb (61.7 kg)  Height: 5' 2 (1.575 m)   Body mass index is 24.87 kg/m. Constitutional:  Well-developed, in no acute distress. Psychiatric: Normal mood and affect. Behavior is normal. HEENT: Pupils normal.  Conjunctivae are normal. No scleral icterus. Cardiovascular: Normal rate, regular rhythm. No edema Pulmonary/chest: Effort normal and breath sounds normal. No wheezing, rales or rhonchi. Abdominal:  Soft, nondistended.  Mild epigastric/left upper quadrant tenderness. Bowel sounds active throughout. There are no masses palpable. No hepatomegaly. Neurological: Alert and oriented to person place and time. Skin: Skin is warm and dry. No rashes noted.  Data Reviewed: I have personally reviewed following labs and imaging studies  CBC:    Latest Ref Rng & Units  11/12/2022    9:33 AM 06/30/2021    4:14 AM 06/29/2021    1:56 AM  CBC  WBC 4.0 - 10.5 K/uL 4.9  4.4  4.9   Hemoglobin 12.0 - 15.0 g/dL 87.7  88.0  88.0   Hematocrit 36.0 - 46.0 % 35.9  35.2  35.3   Platelets 150.0 - 400.0 K/uL 298.0  165  174     CMP:    Latest Ref Rng & Units 11/12/2022    9:33 AM 06/30/2021    4:14 AM 06/29/2021    1:56 AM  CMP  Glucose 70 - 99 mg/dL 77  896  96   BUN 6 - 23 mg/dL 6  7  7    Creatinine 0.40 - 1.20 mg/dL 9.15  9.24  9.27   Sodium 135 - 145 mEq/L 139  143  139   Potassium 3.5 - 5.1 mEq/L 3.7  3.4  3.6   Chloride 96 - 112 mEq/L 101  113  110   CO2 19 - 32 mEq/L 32  26  25   Calcium  8.4 - 10.5 mg/dL 8.8  8.4  7.5   Total Protein 6.0 - 8.3 g/dL 6.5     Total Bilirubin 0.2 - 1.2 mg/dL 0.4     Alkaline Phos 39 - 117 U/L 60     AST 0 - 37 U/L 19     ALT 0 - 35 U/L 16         Anselm Bring, MD 10/27/2023, 10:23 AM  Cc: Keren Vicenta BRAVO, MD

## 2023-10-27 NOTE — Patient Instructions (Addendum)
 _______________________________________________________  If your blood pressure at your visit was 140/90 or greater, please contact your primary care physician to follow up on this.  _______________________________________________________  If you are age 61 or older, your body mass index should be between 23-30. Your Body mass index is 24.87 kg/m. If this is out of the aforementioned range listed, please consider follow up with your Primary Care Provider.  If you are age 7 or younger, your body mass index should be between 19-25. Your Body mass index is 24.87 kg/m. If this is out of the aformentioned range listed, please consider follow up with your Primary Care Provider.   ________________________________________________________  The [facility] providers would like to encourage you to use MYCHART to communicate with providers for non-urgent requests or questions.  Due to long hold times on the telephone, sending your provider a message by [facility] may be a faster and more efficient way to get a response.  Please allow 48 business hours for a response.  Please remember that this is for non-urgent requests.  _______________________________________________________  Your provider has requested that you go to the basement level for lab work before leaving today. Press B on the elevator. The lab is located at the first door on the left as you exit the elevator.  It has been recommended to you by your physician that you have a(n) Colonoscopy completed. Per your request, we did not schedule the procedure(s) today. Please contact our office at [phone] in January. You have been given blank instructions  Instructions mailed and procedure scheduled with patient  We have sent the following medications to your pharmacy for you to pick up at your convenience: Nexium  20mg  daily  mail order Suprep local pharmacy  You have been scheduled for a CT scan of the abdomen and pelvis at [facility] ([address] [place], [place], [place] [address]).   You are scheduled on 11-05-2023  at 3pm. You should arrive by 1245pm your appointment time for registration. Please follow the written instructions below on the day of your exam:  WARNING: IF YOU ARE ALLERGIC TO IODINE/X-RAY DYE, PLEASE NOTIFY RADIOLOGY IMMEDIATELY AT [phone]! YOU WILL BE GIVEN A 13 HOUR PREMEDICATION PREP.  1) Do not eat or drink anything after 11am (4 hours prior to your test) 2) You will be given 2 bottles of oral contrast to drink on site.    Drink 1 bottle of contrast @  1pm  (2 hours prior to your exam)  Drink 1 bottle of contrast @  2pm (1 hour prior to your exam)  You may take any medications as prescribed with a small amount of water , if necessary. If you take any of the following medications: METFORMIN, GLUCOPHAGE, GLUCOVANCE, AVANDAMET, RIOMET, FORTAMET, ACTOPLUS MET, JANUMET, GLUMETZA or METAGLIP, you MAY be asked to HOLD this medication 48 hours AFTER the exam.  The purpose of you drinking the oral contrast is to aid in the visualization of your intestinal tract. The contrast solution may cause some diarrhea. Depending on your individual set of symptoms, you may also receive an intravenous injection of x-ray contrast/dye. Plan on being at [facility] for 30 minutes or longer, depending on the type of exam you are having performed.  This test typically takes 30-45 minutes to complete.  If you have any questions regarding your exam or if you need to reschedule, you may call the CT department at [phone] between the hours of 8:00 am and 5:00 pm, Monday-Friday.  Thank you,  Dr. Lynnie Bring

## 2023-11-03 NOTE — Addendum Note (Signed)
 Addended by: Alberteen Sam E on: 11/03/2023 09:35 AM   Modules accepted: Orders

## 2023-11-05 ENCOUNTER — Ambulatory Visit (HOSPITAL_COMMUNITY): Payer: Federal, State, Local not specified - PPO

## 2023-11-07 ENCOUNTER — Ambulatory Visit (HOSPITAL_COMMUNITY): Admission: RE | Admit: 2023-11-07 | Payer: Federal, State, Local not specified - PPO | Source: Ambulatory Visit

## 2023-11-25 ENCOUNTER — Other Ambulatory Visit: Payer: Self-pay | Admitting: Allergy and Immunology

## 2023-11-26 ENCOUNTER — Ambulatory Visit (HOSPITAL_COMMUNITY): Payer: Federal, State, Local not specified - PPO

## 2023-12-01 ENCOUNTER — Ambulatory Visit (HOSPITAL_COMMUNITY)
Admission: RE | Admit: 2023-12-01 | Discharge: 2023-12-01 | Disposition: A | Discharge status: Home / Self Care | Payer: Federal, State, Local not specified - PPO | Source: Ambulatory Visit | Attending: Gastroenterology | Admitting: Gastroenterology

## 2023-12-01 DIAGNOSIS — K7689 Other specified diseases of liver: Secondary | ICD-10-CM | POA: Diagnosis not present

## 2023-12-01 DIAGNOSIS — D1771 Benign lipomatous neoplasm of kidney: Secondary | ICD-10-CM | POA: Insufficient documentation

## 2023-12-01 DIAGNOSIS — R1013 Epigastric pain: Secondary | ICD-10-CM | POA: Diagnosis not present

## 2023-12-01 DIAGNOSIS — K581 Irritable bowel syndrome with constipation: Secondary | ICD-10-CM | POA: Insufficient documentation

## 2023-12-01 DIAGNOSIS — K5730 Diverticulosis of large intestine without perforation or abscess without bleeding: Secondary | ICD-10-CM | POA: Diagnosis not present

## 2023-12-01 MED ORDER — IOHEXOL 300 MG/ML  SOLN
30.0000 mL | Freq: Once | INTRAMUSCULAR | Status: AC | PRN
  Administered 2023-12-01: 30 mL via ORAL

## 2023-12-01 MED ORDER — IOHEXOL 300 MG/ML  SOLN
100.0000 mL | Freq: Once | INTRAMUSCULAR | Status: AC | PRN
  Administered 2023-12-01: 100 mL via INTRAVENOUS

## 2023-12-17 ENCOUNTER — Encounter: Payer: Self-pay | Admitting: Gastroenterology

## 2023-12-17 DIAGNOSIS — J019 Acute sinusitis, unspecified: Secondary | ICD-10-CM | POA: Diagnosis not present

## 2023-12-17 DIAGNOSIS — J208 Acute bronchitis due to other specified organisms: Secondary | ICD-10-CM | POA: Diagnosis not present

## 2023-12-17 DIAGNOSIS — B9689 Other specified bacterial agents as the cause of diseases classified elsewhere: Secondary | ICD-10-CM | POA: Diagnosis not present

## 2023-12-18 ENCOUNTER — Telehealth: Payer: Self-pay | Admitting: Gastroenterology

## 2023-12-18 NOTE — Telephone Encounter (Signed)
 Inbound call from patient stating she has bronchitis and will be taking steroids when her 3/3 colonoscopy comes. Requesting a call to discuss if it would be better to reschedule. Please advise, thank you.

## 2023-12-21 NOTE — Telephone Encounter (Signed)
 Patient wished to reschedule procedure. Patient has been reschedule for 5/15. States she wishes to feel better before proceeding.

## 2023-12-21 NOTE — Telephone Encounter (Addendum)
 Patient scheduled for previsit 4-22 as telephone and patient is aware and ok with this date

## 2023-12-28 ENCOUNTER — Encounter: Payer: Federal, State, Local not specified - PPO | Admitting: Gastroenterology

## 2023-12-28 DIAGNOSIS — R7301 Impaired fasting glucose: Secondary | ICD-10-CM | POA: Diagnosis not present

## 2023-12-28 DIAGNOSIS — K222 Esophageal obstruction: Secondary | ICD-10-CM | POA: Diagnosis not present

## 2023-12-28 DIAGNOSIS — E039 Hypothyroidism, unspecified: Secondary | ICD-10-CM | POA: Diagnosis not present

## 2023-12-28 DIAGNOSIS — K219 Gastro-esophageal reflux disease without esophagitis: Secondary | ICD-10-CM | POA: Diagnosis not present

## 2023-12-28 DIAGNOSIS — E785 Hyperlipidemia, unspecified: Secondary | ICD-10-CM | POA: Diagnosis not present

## 2023-12-28 DIAGNOSIS — Z1331 Encounter for screening for depression: Secondary | ICD-10-CM | POA: Diagnosis not present

## 2023-12-30 DIAGNOSIS — J019 Acute sinusitis, unspecified: Secondary | ICD-10-CM | POA: Diagnosis not present

## 2023-12-30 DIAGNOSIS — B9689 Other specified bacterial agents as the cause of diseases classified elsewhere: Secondary | ICD-10-CM | POA: Diagnosis not present

## 2024-01-01 DIAGNOSIS — F419 Anxiety disorder, unspecified: Secondary | ICD-10-CM | POA: Diagnosis not present

## 2024-01-15 DIAGNOSIS — K080 Exfoliation of teeth due to systemic causes: Secondary | ICD-10-CM | POA: Diagnosis not present

## 2024-02-12 DIAGNOSIS — R070 Pain in throat: Secondary | ICD-10-CM | POA: Diagnosis not present

## 2024-02-12 DIAGNOSIS — R051 Acute cough: Secondary | ICD-10-CM | POA: Diagnosis not present

## 2024-02-15 ENCOUNTER — Encounter: Payer: Self-pay | Admitting: Allergy and Immunology

## 2024-02-15 ENCOUNTER — Ambulatory Visit (INDEPENDENT_AMBULATORY_CARE_PROVIDER_SITE_OTHER): Payer: Federal, State, Local not specified - PPO | Admitting: Allergy and Immunology

## 2024-02-15 VITALS — BP 140/92 | HR 84 | Temp 98.4°F | Resp 18 | Ht 63.5 in | Wt 135.0 lb

## 2024-02-15 DIAGNOSIS — K200 Eosinophilic esophagitis: Secondary | ICD-10-CM | POA: Diagnosis not present

## 2024-02-15 DIAGNOSIS — J4520 Mild intermittent asthma, uncomplicated: Secondary | ICD-10-CM

## 2024-02-15 DIAGNOSIS — J988 Other specified respiratory disorders: Secondary | ICD-10-CM

## 2024-02-15 DIAGNOSIS — K219 Gastro-esophageal reflux disease without esophagitis: Secondary | ICD-10-CM | POA: Diagnosis not present

## 2024-02-15 DIAGNOSIS — J3089 Other allergic rhinitis: Secondary | ICD-10-CM

## 2024-02-15 MED ORDER — MONTELUKAST SODIUM 10 MG PO TABS: ORAL_TABLET | ORAL | 3 refills | Status: AC

## 2024-02-15 NOTE — Progress Notes (Unsigned)
 [place] [facility] [place] - [place] - [facility]   Follow-up Note  Referring Provider: Adrian Hopper, MD Primary Provider: Adrian Hopper, MD Date of Office Visit: 02/15/2024  Subjective:   Sherri Ruiz (DOB: [birth year]) is a 63 y.o. female who [facility] in re-evaluation of the following:  HPI: Sherri Ruiz returns to this clinic in evaluation of eosinophilic esophagitis, allergic rhinitis, LPR, and a recent respiratory tract infection.  I last saw her in this clinic 16 February 2023.  She did very well regarding her airway issue while using a nasal steroid and a leukotriene modifier and although she contracted influenza in February 2025 she continued to do well until she contracted what appeared to be a respiratory tract infection approximately 10 days ago with lots of coughing requiring her to go to the urgent care center approximately 5 days ago where she was prescribed doxycycline and prednisone which she did not take.  This Saturday she had a temperature 102 and she started her doxycycline and today she is much better with a decrease in her cough and no fever.  She does have a little bit of head fullness and she is coughing up some phlegm.  She did not take any prednisone.  Her EOE appears to be stable.  Stability is defined by the fact that she still must cut up her food into very small pieces to swallow.  But she has had no obstruction to swallowing.  Her last scope was in 2022 with Dr. 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  She has never been treated with dupilumab.  Recently she was instructed to decrease her Nexium  from 40 mg a day to 20 mg a day but unfortunately she develops very significant burning and heartburn in her chest within 3 days of the decrease in Nexium .  She is now back up to 40 mg a day.  She has had very little problems with her asthma  and rarely uses a short acting bronchodilator.  Certainly she coughed a bunch during this infectious episode.  We gave her Airsupra during her last visit but she is now using just albuterol.  She eats eggs.  She tries to minimize dairy consumption.  Allergies as of 02/15/2024       Reactions   Clindamycin/lincomycin Swelling   Meperidine Nausea And Vomiting   unknown   Penicillin G Other (See Comments)   Unknown/childhood allergy  Has patient had a PCN reaction causing immediate rash, facial/tongue/throat swelling, SOB or lightheadedness with hypotension: Yes Has patient had a PCN reaction causing severe rash involving mucus membranes or skin necrosis: Unknown Has patient had a PCN reaction that required hospitalization: No Has patient had a PCN reaction occurring within the last 10 years: Unknown If all of the above answers are "NO", then may proceed with Cephalosporin use.   Ativan  [lorazepam ] Other (See Comments)   Poor clearance, memory loss        Medication List    albuterol 108 (90 Base) MCG/ACT inhaler Commonly known as: VENTOLIN HFA SMARTSIG:2 Puff(s) By Mouth 4 Times Daily PRN   ALPRAZolam  0.25 MG tablet Commonly known as: XANAX  Take 0.25 mg by mouth 2 (two) times daily as needed.   AMBULATORY NON FORMULARY MEDICATION Medication Name: GI Cocktail (Equal amounts of Maalox, Viscous Lidocaine , Bentyl )  Take 10 cc by mouth every 8 hours as needed.  atorvastatin  20 MG tablet Commonly known as: LIPITOR Take 20 mg by mouth at bedtime.   Butalbital-APAP-Caffeine 50-325-40 MG capsule Take 1 capsule by mouth 4 (four) times daily as needed.   DIGESTIVE ADVANTAGE PO Take 1 tablet by mouth daily.   doxycycline 100 MG capsule Commonly known as: VIBRAMYCIN Take 100 mg by mouth 2 (two) times daily.   esomeprazole  20 MG capsule Commonly known as: NEXIUM  Take 1 capsule (20 mg total) by mouth daily.   GAS-X PO Take 1 tablet by mouth 3 (three) times daily as needed.    HAIR SKIN AND NAILS FORMULA PO Take 1 tablet by mouth daily.   hyoscyamine  0.125 MG SL tablet Commonly known as: LEVSIN SL Place 1 tablet (0.125 mg total) under the tongue every 4 (four) hours as needed.   levothyroxine  50 MCG tablet Commonly known as: SYNTHROID  Take 50 mcg by mouth daily.   lisinopril  5 MG tablet Commonly known as: ZESTRIL  Take 5 mg by mouth daily.   Minivelle 0.1 MG/24HR patch Generic drug: estradiol Place 1 patch onto the skin once a week. Friday   montelukast  10 MG tablet Commonly known as: SINGULAIR  TAKE 1 TABLET BY MOUTH EVERYDAY AT BEDTIME   MULTIVITAMIN PO Take 1 tablet by mouth daily.   NUTRITIONAL SUPPLEMENT PO Take by mouth. Vitamin D3 + Zinc + Vitamin C   promethazine -dextromethorphan 6.25-15 MG/5ML syrup Commonly known as: PROMETHAZINE -DM Take 5-10 mLs by mouth every 4 (four) hours as needed.   ZYRTEC PO Take 1 tablet by mouth daily.    Past Medical History:  Diagnosis Date   Allergic rhinitis    Allergy     Anxiety    OCC    Asthma    none since on Singulair  started    Cataract    removed right eye, left forming    Congenital clotting factor deficiency (HCC)    Eosinophilic esophagitis    GERD (gastroesophageal reflux disease)    High blood pressure    High cholesterol    History of hepatitis A    Hypothyroidism    IBS (irritable bowel syndrome)    IDA (iron deficiency anemia)    LPRD (laryngopharyngeal reflux disease)    Penicillin allergy     Peripheral neuropathy    PONV (postoperative nausea and vomiting)    Due to Demerol    Vitamin B12 deficiency anemia     Past Surgical History:  Procedure Laterality Date   CATARACT EXTRACTION  03/2020   CHOLECYSTECTOMY     COLONOSCOPY     ESOPHAGOGASTRODUODENOSCOPY  06/16/2014   Wide open cervial esophageal web. Mild gastrtitis. Gastric polyps.    ESOPHAGOGASTRODUODENOSCOPY  12/25/2017   ESOPHAGOGASTRODUODENOSCOPY  06/27/2021   OTHER SURGICAL HISTORY     Hysterectomy    TOTAL ABDOMINAL HYSTERECTOMY     UPPER GASTROINTESTINAL ENDOSCOPY      Review of systems negative except as noted in HPI / PMHx or noted below:  Review of Systems  Constitutional: Negative.   HENT: Negative.    Eyes: Negative.   Respiratory: Negative.    Cardiovascular: Negative.   Gastrointestinal: Negative.   Genitourinary: Negative.   Musculoskeletal: Negative.   Skin: Negative.   Neurological: Negative.   Endo/Heme/Allergies: Negative.   Psychiatric/Behavioral: Negative.       Objective:   Vitals:   02/15/24 1017  BP: (!) 140/92  Pulse: 84  Resp: 18  Temp: 98.4 F (36.9 C)  SpO2: 95%   Height: 5' 3.5" (161.3 cm)  Weight: 135 lb (61.2  kg)   Physical Exam Constitutional:      Appearance: She is not diaphoretic.  HENT:     Head: Normocephalic.     Right Ear: Tympanic membrane, ear canal and external ear normal.     Left Ear: Tympanic membrane, ear canal and external ear normal.     Nose: Nose normal. No mucosal edema or rhinorrhea.     Mouth/Throat:     Pharynx: Uvula midline. No oropharyngeal exudate.  Eyes:     Conjunctiva/sclera: Conjunctivae normal.  Neck:     Thyroid : No thyromegaly.     Trachea: Trachea normal. No tracheal tenderness or tracheal deviation.  Cardiovascular:     Rate and Rhythm: Normal rate and regular rhythm.     Heart sounds: Normal heart sounds, S1 normal and S2 normal. No murmur heard. Pulmonary:     Effort: No respiratory distress.     Breath sounds: Normal breath sounds. No stridor. No wheezing or rales.  Lymphadenopathy:     Head:     Right side of head: No tonsillar adenopathy.     Left side of head: No tonsillar adenopathy.     Cervical: No cervical adenopathy.  Skin:    Findings: No erythema or rash.     Nails: There is no clubbing.  Neurological:     Mental Status: She is alert.     Diagnostics: Spirometry was performed and demonstrated an FEV1 of 2.18 at 90 % of predicted.  Results of a abdominal pelvic CT scan  dated 01 December 2023 identifies the following:  Lower Chest: No acute findings.  Hepatobiliary: Multiple small hepatic cysts remain stable since prior study. No new or enlarging hepatic lesions identified. Prior cholecystectomy. No evidence of biliary obstruction.  Pancreas:  No mass or inflammatory changes.  Spleen: Within normal limits in size and appearance.  Adrenals/Urinary Tract: A predominantly fat attenuation mass is again seen in the anterior upper and midpole of the right kidney which measures 5.3 x 3.4 cm. This remains stable, and there is no No evidence of associated hemorrhage. No other renal masses seen. No evidence of ureteral calculi or hydronephrosis. Unremarkable unopacified urinary bladder.  Stomach/Bowel: No evidence of obstruction, inflammatory process or abnormal fluid collections. Normal appendix visualized. Severe diverticulosis is seen involving the sigmoid colon, however there is no evidence of diverticulitis.  Vascular/Lymphatic: No pathologically enlarged lymph nodes. No acute vascular findings.  Reproductive: Prior hysterectomy noted. Adnexal regions are unremarkable in appearance.  Other:  None.  Musculoskeletal:  No suspicious bone lesions identified.  Assessment and Plan:   1. Asthma, mild intermittent, well-controlled   2. Other allergic rhinitis   3. LPRD (laryngopharyngeal reflux disease)   4. Eosinophilic esophagitis   5. Respiratory tract infection    1. Continue to avoid all dairy consumption.    2. Continue to Treat inflammation:   A. OTC Nasacort 1 spray each nostril 1 time per day  B. montelukast  10 mg tablet 1 time per day  3. Continue to Treat reflux:   A. Nexium  40 mg tablet 1-2 times per day  B. OTC Mylanta / Maalox   4.  If needed:   A. Albuterol - 2 inhalations every 6-6 hours  B. OTC antihistamine   5. For this recent event:   A. Use the doxycycline for full 10 days  B. Mucinex DM - 2 times per day if needed  6.  If "more" EOE symptoms, get biopsy, and if + then treat with dupilumab.   7.  Return to clinic in one year  8. Influenza = tamiflu. Covid = paxlovid  Mallarie appears to be infected with some type of organism that is responding to the use of doxycycline and we will have her complete that antibiotic and assume that this issue will resolve.  She can maintain therapy with Nasacort and montelukast  for her allergic disease and she can maintain therapy for reflux and EOE with Nexium .  I did have a talk with her about her EOE.  Certainly if she gets more EOE symptoms then she will need to get a biopsy of her esophagus and if positive she can be treated with dupilumab.  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  I will see her back in this clinic in 1 year or earlier if there is a problem.  Schuyler Custard, MD Allergy  / Immunology [facility]

## 2024-02-15 NOTE — Patient Instructions (Addendum)
  1. Continue to avoid all dairy consumption.    2. Continue to Treat inflammation:   A. OTC Nasacort 1 spray each nostril 1 time per day  B. montelukast  10 mg tablet 1 time per day  3. Continue to Treat reflux:   A. Nexium  40 mg tablet 1-2 times per day  B. OTC Mylanta / Maalox   4.  If needed:   A. Albuterol - 2 inhalations every 6-6 hours  B. OTC antihistamine   5. For this recent event:   A. Use the doxycycline for full 10 days  B. Mucinex DM - 2 times per day if needed  6. If "more" EOE symptoms, get biopsy, and if + then treat with dupilumab.   7. Return to clinic in one year  8. Influenza = tamiflu. Covid = paxlovid

## 2024-02-16 ENCOUNTER — Ambulatory Visit (AMBULATORY_SURGERY_CENTER): Payer: Federal, State, Local not specified - PPO

## 2024-02-16 ENCOUNTER — Encounter: Payer: Self-pay | Admitting: Allergy and Immunology

## 2024-02-16 VITALS — Ht 62.0 in | Wt 136.0 lb

## 2024-02-16 DIAGNOSIS — Z860100 Personal history of colon polyps, unspecified: Secondary | ICD-10-CM

## 2024-02-16 MED ORDER — NA SULFATE-K SULFATE-MG SULF 17.5-3.13-1.6 GM/177ML PO SOLN: 1.0000 | Freq: Once | ORAL | 0 refills | Status: AC

## 2024-02-16 NOTE — Progress Notes (Signed)
 Pre visit completed via phone call; Patient verified name, DOB, and address; No egg or soy allergy  known to patient;  Issues known to pt with past sedation with any surgeries or procedures= patient reports severe nausea/vomiting with all sedation/anesthesia medications- patient requesting anti emetic medications be given to prevent nausea/vomiting; Patient denies ever being told they had issues or difficulty with intubation;  No FH of Malignant Hyperthermia; Pt is not on diet pills Pt is not on home 02;  Pt is not on blood thinners;  Pt denies issues with constipation - patient reports constipation when she eats/consumes daily products; No A fib or A flutter; Have any cardiac testing pending--NO Insurance verified during PV appt--- [employer] Federal Pt can ambulate without assistance;  Pt denies use of chewing tobacco; Discussed diabetic/weight loss medication holds; Discussed NSAID holds; Checked BMI to be less than 50; Pt instructed to use [employer] or GoodRx for a price reduction on prep;  Patient's chart reviewed by Rogena Class CNRA prior to previsit and patient appropriate for the [facility];  Pre visit completed and red dot placed by patient's name on their procedure day (on provider's schedule);  Instructions sent to MyChart as well as printed and mailed to patient per her request;

## 2024-03-03 ENCOUNTER — Encounter (HOSPITAL_COMMUNITY): Payer: Self-pay

## 2024-03-03 ENCOUNTER — Telehealth: Payer: Self-pay | Admitting: Gastroenterology

## 2024-03-03 NOTE — Telephone Encounter (Signed)
 Patient requesting GI cocktail. Please advise.

## 2024-03-03 NOTE — Telephone Encounter (Signed)
Please advise Dr Gupta °

## 2024-03-04 NOTE — Telephone Encounter (Signed)
Patient calling to f/u on previous note. Please advise.   Thank you

## 2024-03-07 NOTE — Telephone Encounter (Signed)
 Patient is requesting to know if she is able to take anti nausea medication when taking miralax. Also requesting to discuss if she is able to take magnesium  glycinate instead of miralax. Patient requesting a call back. Please advise, thank you.

## 2024-03-08 ENCOUNTER — Other Ambulatory Visit: Payer: Self-pay

## 2024-03-08 ENCOUNTER — Telehealth: Payer: Self-pay

## 2024-03-08 MED ORDER — AMBULATORY NON FORMULARY MEDICATION: 2 refills | Status: AC

## 2024-03-08 NOTE — Telephone Encounter (Signed)
 Spoke to patient advised per provider OK  to take antinausea medications while taking MiraLAX. Please take MiraLAX. Magnesium  citrate or gluconate-has too much magnesium  in it.

## 2024-03-08 NOTE — Telephone Encounter (Signed)
 OK to take antinausea medications while taking MiraLAX.  If she does not have any, please call in Zofran  4 mg ODT Q8hrs prn #4 Please take MiraLAX. Magnesium  citrate or gluconate-has too much magnesium  in it  RG

## 2024-03-10 ENCOUNTER — Encounter: Payer: Self-pay | Admitting: Gastroenterology

## 2024-03-10 ENCOUNTER — Ambulatory Visit (AMBULATORY_SURGERY_CENTER): Payer: Federal, State, Local not specified - PPO | Admitting: Gastroenterology

## 2024-03-10 VITALS — BP 129/85 | HR 72 | Temp 97.1°F | Resp 17 | Ht 62.0 in | Wt 136.0 lb

## 2024-03-10 DIAGNOSIS — K5730 Diverticulosis of large intestine without perforation or abscess without bleeding: Secondary | ICD-10-CM | POA: Diagnosis not present

## 2024-03-10 DIAGNOSIS — Z1211 Encounter for screening for malignant neoplasm of colon: Secondary | ICD-10-CM

## 2024-03-10 DIAGNOSIS — Z8371 Family history of adenomatous and serrated polyps: Secondary | ICD-10-CM

## 2024-03-10 DIAGNOSIS — K640 First degree hemorrhoids: Secondary | ICD-10-CM

## 2024-03-10 DIAGNOSIS — Z860100 Personal history of colon polyps, unspecified: Secondary | ICD-10-CM | POA: Diagnosis not present

## 2024-03-10 MED ORDER — SODIUM CHLORIDE 0.9 % IV SOLN: 500.0000 mL | Freq: Once | INTRAVENOUS | Status: DC

## 2024-03-10 NOTE — Progress Notes (Signed)
 Report to PACU, RN, vss, BBS= Clear.

## 2024-03-10 NOTE — Patient Instructions (Addendum)
-  Handout on diverticulosis and hemorrhoids; high fiber diet -repeat colonoscopy in 10 years for surveillance recommended.  -Continue present medications   YOU HAD AN ENDOSCOPIC PROCEDURE TODAY AT THE [facility]:   Refer to the procedure report that was given to you for any specific questions about what was found during the examination.  If the procedure report does not answer your questions, please call your gastroenterologist to clarify.  If you requested that your care partner not be given the details of your procedure findings, then the procedure report has been included in a sealed envelope for you to review at your convenience later.  YOU SHOULD EXPECT: Some feelings of bloating in the abdomen. Passage of more gas than usual.  Walking can help get rid of the air that was put into your GI tract during the procedure and reduce the bloating. If you had a lower endoscopy (such as a colonoscopy or flexible sigmoidoscopy) you may notice spotting of blood in your stool or on the toilet paper. If you underwent a bowel prep for your procedure, you may not have a normal bowel movement for a few days.  Please Note:  You might notice some irritation and congestion in your nose or some drainage.  This is from the oxygen used during your procedure.  There is no need for concern and it should clear up in a day or so.  SYMPTOMS TO REPORT IMMEDIATELY:  Following lower endoscopy (colonoscopy or flexible sigmoidoscopy):  Excessive amounts of blood in the stool  Significant tenderness or worsening of abdominal pains  Swelling of the abdomen that is new, acute  Fever of 100F or higher     For urgent or emergent issues, a gastroenterologist can be reached at any hour by calling [phone]. Do not use MyChart messaging for urgent concerns.    DIET:  We do recommend a small meal at first, but then you may proceed to your regular diet.  Drink plenty of fluids but you should avoid alcoholic  beverages for 24 hours.  ACTIVITY:  You should plan to take it easy for the rest of today and you should NOT DRIVE or use heavy machinery until tomorrow (because of the sedation medicines used during the test).    FOLLOW UP: Our staff will call the number listed on your records the next business day following your procedure.  We will call around 7:15- 8:00 am to check on you and address any questions or concerns that you may have regarding the information given to you following your procedure. If we do not reach you, we will leave a message.     If any biopsies were taken you will be contacted by phone or by letter within the next 1-3 weeks.  Please call us  at [phone] if you have not heard about the biopsies in 3 weeks.    SIGNATURES/CONFIDENTIALITY: You and/or your care partner have signed paperwork which will be entered into your electronic medical record.  These signatures attest to the fact that that the information above on your After Visit Summary has been reviewed and is understood.  Full responsibility of the confidentiality of this discharge information lies with you and/or your care-partner.

## 2024-03-10 NOTE — Progress Notes (Signed)
 Pt's states no medical or surgical changes since previsit or office visit.  Informed CRNA of patient's current nausea and prep. Per CRNA okay to proceed.

## 2024-03-10 NOTE — Progress Notes (Signed)
 Chief Complaint: FU  Referring Provider:  Adrian Hopper, MD      ASSESSMENT AND PLAN;   #1. PPI responsive EoE with GERD/NCCP. Dx 05/2014, treated with PPIs and Flovent , did very well on food restriction.  S/P EGD with dil 46Fr 06/27/2021 complicated by UGI bleed.  #2. Epi pain/LUQ pain. S/P cholecystectomy in past.  #3. H/O diverticulitis, treated with antibiotics 11/2018. Has mod-severe sigmoid div with possible muscular hypertrophy/stenosis.  #4. IBS-C. Next colon due 09/2023 with 2 day prep.  #5.  Liver cyst/R kidney angiomyolipoma.  Plan - Decrease nexium  20 mg p.o. QD #90 4RF. - CBC, CMP.  - CT AP with contrast for epi pain/FU liver cyst and right kidney angiomyolipoma. - Avoid diary, eggs, potatoes, chocolate and caffeine as suggested by Dr. Kozlow. - Recall colonoscopy feb/march 2025 with 2-day prep.   HPI:    Sherri Ruiz is a 63 y.o. female   Discussed the use of AI scribe software for clinical note transcription with the patient, who gave verbal consent to proceed.  History of Present Illness   The patient presents with changes in bowel habits, experiencing increased frequency from once to two or three times daily. This change has been ongoing and is not associated with any other gastrointestinal symptoms. The patient also reports a persistent pain in the left upper quadrant, described as a knot-like sensation. This pain was previously evaluated by another physician who attributed it to muscular origin.  The patient has a history of reflux, eosinophilic esophagitis, and nutcracker esophagus, and is on long-term Nexium  for stomach issues. They express concern about the long-term effects of Nexium  and are considering reducing the dosage or switching to Protonix .  The patient also reports daily nausea, more prominent in the mornings and sometimes after eating. They are unsure if the nausea is related to their Nexium  intake. The patient has a known allergy  to dairy,  eggs, potatoes, chocolate, and caffeine, but admits to not strictly adhering to dietary restrictions.  The patient has a history of a cyst in the liver and a spot on the kidney, both deemed benign. They also had a previous episode of gastrointestinal bleeding. The patient is due for a colonoscopy and has had difficulty with the prep in the past due to sensitivity to Miralax.       Wt Readings from Last 3 Encounters:  03/10/24 136 lb (61.7 kg)  02/16/24 136 lb (61.7 kg)  02/15/24 135 lb (61.2 kg)    Prev notes: Does admit that she has been under considerable stress due to family problems (daughters with the grandkids had moved in.  At one point she had 11 people living in her home, she also recently has been promoted in her job).  1 daughter is a Engineer, civil (consulting) at surgical floor [employer].  Past GI Proc:  EGD with dil 06/27/2021 - Few circular rings in the proximal and distal esophagus without any obvious stricture. Dilated. BX- NEG for EoE - Small hiatal hernia. - Gastritis. Bx- neg for HP - Multiple gastric polyps. Resected and retrieved x 3. Bx-fundic gland polyps - Complicated by UGI bleeding req hospitalization, 2U    EGD 03/21/2020  -Few circular rings in the distal esophagus (bx-eosinophilic esophagitis Eos> 30 per high-power field) -Small transient hiatal hernia. -Multiple gastric polyps. Bx- fundic gland polyps. -Mild gastritis. Biopsied.  EGD 05/2014-wide-open cervical esophageal web, mild gastritis, gastric polyps.  Eso Bx- showed increased eosinophils over 20 per HPF c/w eosinophilic esophagitis.  Gastric Bx-  reactive gastropathy, intestinal metaplasia.  Subsequent EGD 12/25/2017-resolution of eosinophilic esophagitis on biopsies, gastric mapping negative for intestinal metaplasia, small incidental gastric polyps measuring 4 to 6 mm.  EGD 2002 at [place] -esophagitis, dysphagia due to nutcracker esophagus s/p multiple EGDs with dilatations, 1991 at [place] , 1992 at [place] with  dilation 50 Fr followed by manometry showing nutcracker esophagus, 1994 esophageal dilatation with 48 Fr -Colonoscopy 03/21/2020: Diminutive colonic polyp SP polypectomy, moderate to severe sigmoid diverticulosis with sigmoid stricture.  Otherwise normal to TI..  10/07/2013-good prep, moderate sigmoid diverticulosis, small internal hemorrhoids.  Otherwise normal to TI.  Next due 09/2023 unless with any new problems. 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 - CT AP 02/2019: IMPRESSION: 1. No acute findings in the abdomen or pelvis. Relatively advanced left colonic diverticulosis without diverticulitis. 2. 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 3. The 16 mm low-density lesion in the anterior right liver may be a complex cysts, but cannot be definitively characterized. MRI of the abdomen with and without contrast recommended to further evaluate. She wanted to hold off on MRI Past Medical History:  Diagnosis Date   Allergic rhinitis    Allergy     Anxiety    OCC    Asthma    none since on Singulair  started    Cataract    removed right eye, left forming    Congenital clotting factor deficiency (HCC)    Eosinophilic esophagitis    GERD (gastroesophageal reflux disease)    High blood pressure    High cholesterol    History of hepatitis A    Hypothyroidism    IBS (irritable bowel syndrome)    IDA (iron deficiency anemia)    LPRD (laryngopharyngeal reflux disease)    Penicillin allergy     Peripheral neuropathy    PONV (postoperative nausea and vomiting)    Due to Demerol    Vitamin B12 deficiency anemia     Past Surgical History:  Procedure Laterality Date   CATARACT EXTRACTION  03/2020   CHOLECYSTECTOMY     COLONOSCOPY  02/2020   RG-MAC-poor prep per MD- needs 2 day prep on next colon   ESOPHAGOGASTRODUODENOSCOPY  06/16/2014   Wide open cervial esophageal web. Mild gastrtitis. Gastric polyps.     ESOPHAGOGASTRODUODENOSCOPY  12/25/2017   ESOPHAGOGASTRODUODENOSCOPY  06/27/2021   TOTAL ABDOMINAL HYSTERECTOMY     UPPER GASTROINTESTINAL ENDOSCOPY      Family History  Problem Relation Age of Onset   Clotting disorder Mother    High Cholesterol Mother    Asthma Mother    Stroke Mother    High blood pressure Father    Heart attack Father 10       [place] POW.  Atrial fib.     Colon polyps Father    Cancer Sister    Asthma Daughter    Heart disease Brother        Pacemaker   Stomach cancer Maternal Aunt    Esophageal cancer Other    Colon cancer Neg Hx    Rectal cancer Neg Hx     Social History   Tobacco Use   Smoking status: Former    Current packs/day: 0.00    Types: Cigarettes    Quit date: 1989    Years since quitting: 36.3   Smokeless tobacco: Never  Vaping Use   Vaping status: Never Used  Substance Use Topics   Alcohol use: Not Currently   Drug use: Never  Current Outpatient Medications  Medication Sig Dispense Refill   atorvastatin  (LIPITOR) 20 MG tablet Take 20 mg by mouth at bedtime.   1   Cetirizine HCl (ZYRTEC PO) Take 1 tablet by mouth daily.      esomeprazole  (NEXIUM ) 20 MG capsule Take 1 capsule (20 mg total) by mouth daily. 90 capsule 4   levothyroxine  (SYNTHROID ) 50 MCG tablet Take 50 mcg by mouth daily.     lisinopril  (PRINIVIL ,ZESTRIL ) 5 MG tablet Take 5 mg by mouth daily.      MINIVELLE 0.1 MG/24HR patch Place 1 patch onto the skin once a week. Friday     montelukast  (SINGULAIR ) 10 MG tablet TAKE 1 TABLET BY MOUTH EVERYDAY AT BEDTIME 90 tablet 3   Acetaminophen  500 MG capsule Take by mouth.     albuterol (VENTOLIN HFA) 108 (90 Base) MCG/ACT inhaler SMARTSIG:2 Puff(s) By Mouth 4 Times Daily PRN     ALPRAZolam  (XANAX ) 0.25 MG tablet Take 0.25 mg by mouth 2 (two) times daily as needed.     AMBULATORY NON FORMULARY MEDICATION Medication Name: GI Cocktail (Equal amounts of Maalox, Viscous Lidocaine , Bentyl )  Take 10 cc by mouth every 8 hours as  needed. 120 mL 2   doxycycline (VIBRAMYCIN) 100 MG capsule Take 100 mg by mouth 2 (two) times daily.     guaiFENesin (MUCINEX PO) Take 1 tablet by mouth every 6 (six) hours as needed.     hyoscyamine  (LEVSIN SL) 0.125 MG SL tablet Place 1 tablet (0.125 mg total) under the tongue every 4 (four) hours as needed. (Patient not taking: Reported on 03/10/2024) 120 tablet 4   Multiple Vitamins-Minerals (HAIR SKIN AND NAILS FORMULA PO) Take 1 tablet by mouth daily.     Multiple Vitamins-Minerals (MULTIVITAMIN PO) Take 1 tablet by mouth daily.     Nutritional Supplements (NUTRITIONAL SUPPLEMENT PO) Take by mouth. Vitamin D3 + Zinc + Vitamin C     Probiotic Product (DIGESTIVE ADVANTAGE PO) Take 1 tablet by mouth daily.     promethazine -dextromethorphan (PROMETHAZINE -DM) 6.25-15 MG/5ML syrup Take 5-10 mLs by mouth every 4 (four) hours as needed.     Simethicone  (GAS-X PO) Take 1 tablet by mouth 3 (three) times daily as needed.     Current Facility-Administered Medications  Medication Dose Route Frequency Provider Last Rate Last Admin   0.9 %  sodium chloride  infusion  500 mL Intravenous Once Lajuan Pila, MD        Allergies  Allergen Reactions   Clindamycin/Lincomycin Swelling   Meperidine Nausea And Vomiting    unknown   Penicillin G Other (See Comments)    Unknown/childhood allergy  Has patient had a PCN reaction causing immediate rash, facial/tongue/throat swelling, SOB or lightheadedness with hypotension: Yes Has patient had a PCN reaction causing severe rash involving mucus membranes or skin necrosis: Unknown Has patient had a PCN reaction that required hospitalization: No Has patient had a PCN reaction occurring within the last 10 years: Unknown If all of the above answers are "NO", then may proceed with Cephalosporin use.     Ativan  [Lorazepam ] Other (See Comments)    Poor clearance, memory loss    Review of Systems:  Negative except for HPI     Physical Exam:    Today's Vitals    03/10/24 0832  BP: (!) 132/90  Pulse: 81  Temp: (!) 97.1 F (36.2 C)  TempSrc: Temporal  SpO2: 99%  Weight: 136 lb (61.7 kg)  Height: 5\' 2"  (1.575 m)   Body mass index  is 24.87 kg/m. Constitutional:  Well-developed, in no acute distress. Psychiatric: Normal mood and affect. Behavior is normal. HEENT: Pupils normal.  Conjunctivae are normal. No scleral icterus. Cardiovascular: Normal rate, regular rhythm. No edema Pulmonary/chest: Effort normal and breath sounds normal. No wheezing, rales or rhonchi. Abdominal: Soft, nondistended.  Mild epigastric/left upper quadrant tenderness. Bowel sounds active throughout. There are no masses palpable. No hepatomegaly. Neurological: Alert and oriented to person place and time. Skin: Skin is warm and dry. No rashes noted.  Data Reviewed: I have personally reviewed following labs and imaging studies  CBC:    Latest Ref Rng & Units 10/27/2023   11:11 AM 11/12/2022    9:33 AM 06/30/2021    4:14 AM  CBC  WBC 4.0 - 10.5 K/uL 5.3  4.9  4.4   Hemoglobin 12.0 - 15.0 g/dL 16.1  09.6  04.5   Hematocrit 36.0 - 46.0 % 37.1  35.9  35.2   Platelets 150.0 - 400.0 K/uL 315.0  298.0  165     CMP:    Latest Ref Rng & Units 10/27/2023   11:11 AM 11/12/2022    9:33 AM 06/30/2021    4:14 AM  CMP  Glucose 70 - 99 mg/dL 409  77  811   BUN 6 - 23 mg/dL 11  6  7    Creatinine 0.40 - 1.20 mg/dL 9.14  7.82  9.56   Sodium 135 - 145 mEq/L 138  139  143   Potassium 3.5 - 5.1 mEq/L 4.1  3.7  3.4   Chloride 96 - 112 mEq/L 101  101  113   CO2 19 - 32 mEq/L 30  32  26   Calcium  8.4 - 10.5 mg/dL 8.8  8.8  8.4   Total Protein 6.0 - 8.3 g/dL 6.8  6.5    Total Bilirubin 0.2 - 1.2 mg/dL 0.6  0.4    Alkaline Phos 39 - 117 U/L 55  60    AST 0 - 37 U/L 17  19    ALT 0 - 35 U/L 15  16        Magnus Schuller, MD 03/10/2024, 9:06 AM  Cc: Adrian Hopper, MD

## 2024-03-10 NOTE — Op Note (Signed)
 [facility] Patient Name: Sherri Ruiz Procedure Date: 03/10/2024 9:02 AM MRN: [id number] Endoscopist: Lajuan Pila , MD, [id number] Age: 63 Referring MD:  Date of Birth: [birth year] Gender: Female Account #: [id number] Procedure:                Colonoscopy Indications:              High risk colon cancer surveillance: Personal                            history of colonic polyps Medicines:                Monitored Anesthesia Care Procedure:                Pre-Anesthesia Assessment:                           - Prior to the procedure, a History and Physical                            was performed, and patient medications and                            allergies were reviewed. The patient's tolerance of                            previous anesthesia was also reviewed. The risks                            and benefits of the procedure and the sedation                            options and risks were discussed with the patient.                            All questions were answered, and informed consent                            was obtained. Prior Anticoagulants: The patient has                            taken no anticoagulant or antiplatelet agents. ASA                            Grade Assessment: II - A patient with mild systemic                            disease. After reviewing the risks and benefits,                            the patient was deemed in satisfactory condition to                            undergo the procedure.  After obtaining informed consent, the colonoscope                            was passed under direct vision. Throughout the                            procedure, the patient's blood pressure, pulse, and                            oxygen saturations were monitored continuously. The                            Olympus Scope PCF SN [id number] was introduced through                            the anus and advanced to the 2 cm  into the ileum.                            The colonoscopy was performed without difficulty.                            The patient tolerated the procedure well. The                            quality of the bowel preparation was good. The                            terminal ileum, ileocecal valve, appendiceal                            orifice, and rectum were photographed. Scope In: 9:12:30 AM Scope Out: 9:22:32 AM Scope Withdrawal Time: 0 hours 7 minutes 35 seconds  Total Procedure Duration: 0 hours 10 minutes 2 seconds  Findings:                 Multiple medium-mouthed diverticula were found in                            the sigmoid colon, few in descending colon and                            several small in ascending colon.                           Non-bleeding internal hemorrhoids were found during                            retroflexion. The hemorrhoids were small and Grade                            I (internal hemorrhoids that do not prolapse).                           The terminal ileum appeared normal.  The exam was otherwise without abnormality on                            direct and retroflexion views. Complications:            No immediate complications. Estimated Blood Loss:     Estimated blood loss: none. Impression:               - Moderate predominantly sigmoid diverticulosis.                           - Non-bleeding internal hemorrhoids.                           - The examined portion of the ileum was normal.                           - The examination was otherwise normal on direct                            and retroflexion views.                           - No specimens collected. Recommendation:           - Patient has a contact number available for                            emergencies. The signs and symptoms of potential                            delayed complications were discussed with the                            patient.  Return to normal activities tomorrow.                            Written discharge instructions were provided to the                            patient.                           - High fiber diet.                           - Continue present medications.                           - Repeat colonoscopy in 10 years for screening                            purposes. Earlier, if with any new problems or                            change in family history.                           -  The findings and recommendations were discussed                            with the patient's family. Lajuan Pila, MD 03/10/2024 9:28:19 AM This report has been signed electronically.

## 2024-03-11 ENCOUNTER — Telehealth: Payer: Self-pay

## 2024-03-11 NOTE — Telephone Encounter (Signed)
 Left message

## 2024-03-15 DIAGNOSIS — M5412 Radiculopathy, cervical region: Secondary | ICD-10-CM | POA: Diagnosis not present

## 2024-04-18 ENCOUNTER — Telehealth: Payer: Self-pay | Admitting: Gastroenterology

## 2024-04-18 ENCOUNTER — Other Ambulatory Visit (HOSPITAL_COMMUNITY): Payer: Self-pay

## 2024-04-18 NOTE — Telephone Encounter (Signed)
 Dr. Charlanne, looks like you decreased Nexium  to 20 mg daily. Patient saw allergy  MD and informed them that she was having worsening symptoms on lower dose and they increased her to Nexium  40 mg daily. Last RX was for 20 mg. Please advise on new prescription. Thanks

## 2024-04-18 NOTE — Telephone Encounter (Signed)
 Contradicting information on patient chart regarding this medication. Looks like the last fill she had was 12.31.24 for the 20mg  capsules, #90 for 90. There has not been a fill since. Then, there is a note from 4.21.25 stating that she was decreased to the 20mg  but starting showing loss of effectiveness so was increased back to 40mg . However, the 40mg  has not been filled since 2024 and her medication list shows that she is still on the 20mg .   Please advise which strength the patient is suppose to be on. Thank you.

## 2024-04-18 NOTE — Telephone Encounter (Signed)
 Inbound call from patient requesting a refill for Nexium . States she received a letter advising her that he medication is approved until July of this year. Patient states refill needs prior authorization. Please advise, thank you.

## 2024-04-20 NOTE — Telephone Encounter (Signed)
 Pl do Nexium  40 mg p.o. once a day #90 4RF (as advised by allergy  MD) RG

## 2024-04-21 ENCOUNTER — Other Ambulatory Visit (HOSPITAL_COMMUNITY): Payer: Self-pay

## 2024-04-21 MED ORDER — ESOMEPRAZOLE MAGNESIUM 40 MG PO CPDR: 40.0000 mg | DELAYED_RELEASE_CAPSULE | Freq: Every day | ORAL | 4 refills | Status: DC

## 2024-04-21 MED ORDER — ESOMEPRAZOLE MAGNESIUM 40 MG PO CPDR: 40.0000 mg | DELAYED_RELEASE_CAPSULE | Freq: Every day | ORAL | 4 refills | Status: AC

## 2024-04-21 NOTE — Telephone Encounter (Signed)
 Nexium  RX sent to [facility]

## 2024-04-21 NOTE — Telephone Encounter (Signed)
 Inbound call from patient requesting for prescription to be sent to [facility] instead. Provided phone number of  [phone]. Please advise, thank you

## 2024-04-21 NOTE — Telephone Encounter (Signed)
 RX for Nexium  40 mg daily [facility]. Called and informed patient.

## 2024-05-04 ENCOUNTER — Telehealth: Payer: Self-pay

## 2024-05-04 ENCOUNTER — Other Ambulatory Visit (HOSPITAL_COMMUNITY): Payer: Self-pay

## 2024-05-04 NOTE — Telephone Encounter (Signed)
 Pharmacy Patient Advocate Encounter   Received notification from CoverMyMeds that prior authorization for Esomeprazole  Magnesium  40MG  dr capsules is required/requested.   Insurance verification completed.   The patient is insured through [facility] .   Per test claim: Refill too soon. PA is not needed at this time. Medication was filled 04-21-2024. Next eligible fill date is 06-28-2024.

## 2024-05-10 DIAGNOSIS — B9689 Other specified bacterial agents as the cause of diseases classified elsewhere: Secondary | ICD-10-CM | POA: Diagnosis not present

## 2024-05-10 DIAGNOSIS — T753XXA Motion sickness, initial encounter: Secondary | ICD-10-CM | POA: Diagnosis not present

## 2024-05-10 DIAGNOSIS — J019 Acute sinusitis, unspecified: Secondary | ICD-10-CM | POA: Diagnosis not present

## 2024-06-03 DIAGNOSIS — J320 Chronic maxillary sinusitis: Secondary | ICD-10-CM | POA: Diagnosis not present

## 2024-06-27 DIAGNOSIS — W19XXXA Unspecified fall, initial encounter: Secondary | ICD-10-CM

## 2024-06-27 HISTORY — DX: Unspecified fall, initial encounter: W19.XXXA

## 2024-06-30 ENCOUNTER — Telehealth: Payer: Self-pay

## 2024-06-30 ENCOUNTER — Other Ambulatory Visit (HOSPITAL_COMMUNITY): Payer: Self-pay

## 2024-06-30 NOTE — Telephone Encounter (Signed)
 Pharmacy Patient Advocate Encounter   Received notification from CoverMyMeds that prior authorization for Esomeprazole  Magnesium  40MG  dr capsules is required/requested.   Insurance verification completed.   The patient is insured through [facility] .   Prior Authorization for Esomeprazole  Magnesium  40MG  dr capsules has been APPROVED from 06-30-2024 to 06-30-2025   PA #/Case ID/Reference #: [id number]

## 2024-07-15 DIAGNOSIS — S61412A Laceration without foreign body of left hand, initial encounter: Secondary | ICD-10-CM | POA: Diagnosis not present

## 2024-07-15 DIAGNOSIS — S0181XA Laceration without foreign body of other part of head, initial encounter: Secondary | ICD-10-CM | POA: Diagnosis not present

## 2024-07-15 DIAGNOSIS — E860 Dehydration: Secondary | ICD-10-CM | POA: Diagnosis not present

## 2024-07-15 DIAGNOSIS — S0232XA Fracture of orbital floor, left side, initial encounter for closed fracture: Secondary | ICD-10-CM | POA: Diagnosis not present

## 2024-07-25 DIAGNOSIS — K219 Gastro-esophageal reflux disease without esophagitis: Secondary | ICD-10-CM | POA: Diagnosis not present

## 2024-07-25 DIAGNOSIS — E039 Hypothyroidism, unspecified: Secondary | ICD-10-CM | POA: Diagnosis not present

## 2024-07-25 DIAGNOSIS — Z23 Encounter for immunization: Secondary | ICD-10-CM | POA: Diagnosis not present

## 2024-07-25 DIAGNOSIS — E785 Hyperlipidemia, unspecified: Secondary | ICD-10-CM | POA: Diagnosis not present

## 2024-07-25 DIAGNOSIS — R7301 Impaired fasting glucose: Secondary | ICD-10-CM | POA: Diagnosis not present

## 2024-07-25 DIAGNOSIS — K222 Esophageal obstruction: Secondary | ICD-10-CM | POA: Diagnosis not present

## 2024-08-12 DIAGNOSIS — R0609 Other forms of dyspnea: Secondary | ICD-10-CM | POA: Diagnosis not present

## 2024-08-12 DIAGNOSIS — I10 Essential (primary) hypertension: Secondary | ICD-10-CM | POA: Diagnosis not present

## 2024-08-12 DIAGNOSIS — F0781 Postconcussional syndrome: Secondary | ICD-10-CM | POA: Diagnosis not present

## 2024-08-12 DIAGNOSIS — E871 Hypo-osmolality and hyponatremia: Secondary | ICD-10-CM | POA: Diagnosis not present

## 2024-08-18 DIAGNOSIS — R0609 Other forms of dyspnea: Secondary | ICD-10-CM | POA: Diagnosis not present

## 2024-08-26 DIAGNOSIS — I10 Essential (primary) hypertension: Secondary | ICD-10-CM | POA: Diagnosis not present

## 2024-10-04 ENCOUNTER — Telehealth: Payer: Self-pay | Admitting: Gastroenterology

## 2024-10-04 NOTE — Telephone Encounter (Signed)
 Inbound call from patient stating that she would like to speak to [facility] in regards to her medications. Patient stated she has tried to self medicate but has not helped. Please advise.

## 2024-10-04 NOTE — Telephone Encounter (Signed)
 Spoke with patient at length. Patient had several concerns regarding her stools and heartburn. Patient reports that she has been having increased heartburn when trying to taper down on Nexium  prescription. Patient had reduced to Nexium  60 mg daily, pt purchased 20 mg tablets OTC to supplement. Patient states that symptoms are mainly controlled on Nexium  40 mg BID. I advised patient to continue this dose since symptoms are controlled and to use her GI cocktail PRN. Patient has requested a refill of GI cocktail be sent to [facility]. Regarding the diarrhea, patient states that it has been going a for a while. Patient reports being on several antibiotics for the past 9 months for a dental issue. Patient mentioned being prescribed a Z-pack, Doxycycline, and Clindamycin. I informed patient that some long term antibiotic use can cause C. Diff. Patient states that her stools are more formed when she takes Pepto-Bismol. I asked patient if she had diarrhea or loose stool if she did not take the Pepto and she told me that she didn't really know because she just really started paying attention to it. Patient mentioned that she had already had about 3-4 episodes of diarrhea when she called this morning. I told patient that she will likely need stool studies but she also needs an OV to discuss making medication adjustments. I offered patient an appt this week with Dr. Charlanne but patient is unable to come in. Patient has been scheduled for a follow up appt with Alan Coombs, PA on Monday, 10/17/24 at 8:40 am. Informed patient that we will have Dr. Charlanne advise on recommendations in the interim. Please advise, thanks.   Called [employer]. Gave [place] verbal refill request for GI cocktail.

## 2024-10-06 NOTE — Telephone Encounter (Signed)
 Called and spoke with patient. Patient states that her daughter is a trauma nurse and told her that her stools would be liquid diarrhea and have a foul smell if she had C. Diff due to antibiotics. I informed patient that we are not only checking for C. Diff but ruling out any other infection that could be causing the diarrhea. I informed patient that this the MD's recommendation, her choice if she wishes to proceed with stool test but she would need to have these complete prior to 10/17/24 appt. Patient states that she is confused and will call back when she gets off of work.

## 2024-10-06 NOTE — Telephone Encounter (Signed)
 Good work Also lets check stool for C. Difficile, GI pathogens prior to appointment if possible RG

## 2024-10-07 DIAGNOSIS — F419 Anxiety disorder, unspecified: Secondary | ICD-10-CM | POA: Diagnosis not present

## 2024-10-11 NOTE — Telephone Encounter (Signed)
 Follow up phone call was made to pt to check on recent recommendations: Pt stated that her diarrhea is better now. Pt stated that she is working 6 or 7 days a week now. Pt stated that she will needed to cancel the 10/17/2024 appointment with Alan Coombs PA. Appointment canceled. Pt made aware.  Pt does have a follow up on 11/30/2024 at 9:30 AM  with Dr. Charlanne that was previously scheduled.  Pt is aware.  Pt verbalized understanding with all questions answered.

## 2024-10-17 ENCOUNTER — Ambulatory Visit: Admitting: Physician Assistant

## 2024-11-30 ENCOUNTER — Telehealth: Payer: Self-pay

## 2024-11-30 ENCOUNTER — Other Ambulatory Visit

## 2024-11-30 ENCOUNTER — Encounter: Payer: Self-pay | Admitting: Gastroenterology

## 2024-11-30 ENCOUNTER — Ambulatory Visit: Admitting: Gastroenterology

## 2024-11-30 VITALS — BP 120/66 | HR 80 | Ht 62.0 in | Wt 141.1 lb

## 2024-11-30 DIAGNOSIS — K7689 Other specified diseases of liver: Secondary | ICD-10-CM

## 2024-11-30 DIAGNOSIS — K581 Irritable bowel syndrome with constipation: Secondary | ICD-10-CM

## 2024-11-30 DIAGNOSIS — Z8719 Personal history of other diseases of the digestive system: Secondary | ICD-10-CM

## 2024-11-30 DIAGNOSIS — K200 Eosinophilic esophagitis: Secondary | ICD-10-CM

## 2024-11-30 DIAGNOSIS — K219 Gastro-esophageal reflux disease without esophagitis: Secondary | ICD-10-CM

## 2024-11-30 DIAGNOSIS — R1012 Left upper quadrant pain: Secondary | ICD-10-CM | POA: Diagnosis not present

## 2024-11-30 DIAGNOSIS — R1013 Epigastric pain: Secondary | ICD-10-CM

## 2024-11-30 LAB — COMPREHENSIVE METABOLIC PANEL WITH GFR
ALT: 14 U/L (ref 3–35)
AST: 18 U/L (ref 5–37)
Albumin: 3.8 g/dL (ref 3.5–5.2)
Alkaline Phosphatase: 50 U/L (ref 39–117)
BUN: 10 mg/dL (ref 6–23)
CO2: 27 meq/L (ref 19–32)
Calcium: 8.6 mg/dL (ref 8.4–10.5)
Chloride: 101 meq/L (ref 96–112)
Creatinine, Ser: 0.8 mg/dL (ref 0.40–1.20)
GFR: 79.36 mL/min
Glucose, Bld: 94 mg/dL (ref 70–99)
Potassium: 3.8 meq/L (ref 3.5–5.1)
Sodium: 136 meq/L (ref 135–145)
Total Bilirubin: 0.4 mg/dL (ref 0.2–1.2)
Total Protein: 6.2 g/dL (ref 6.0–8.3)

## 2024-11-30 LAB — CBC WITH DIFFERENTIAL/PLATELET
Basophils Absolute: 0 10*3/uL (ref 0.0–0.1)
Basophils Relative: 0.7 % (ref 0.0–3.0)
Eosinophils Absolute: 0.2 10*3/uL (ref 0.0–0.7)
Eosinophils Relative: 3.2 % (ref 0.0–5.0)
HCT: 33.9 % — ABNORMAL LOW (ref 36.0–46.0)
Hemoglobin: 11.3 g/dL — ABNORMAL LOW (ref 12.0–15.0)
Lymphocytes Relative: 32.8 % (ref 12.0–46.0)
Lymphs Abs: 1.6 10*3/uL (ref 0.7–4.0)
MCHC: 33.5 g/dL (ref 30.0–36.0)
MCV: 80.4 fl (ref 78.0–100.0)
Monocytes Absolute: 0.3 10*3/uL (ref 0.1–1.0)
Monocytes Relative: 7.2 % (ref 3.0–12.0)
Neutro Abs: 2.7 10*3/uL (ref 1.4–7.7)
Neutrophils Relative %: 56.1 % (ref 43.0–77.0)
Platelets: 240 10*3/uL (ref 150.0–400.0)
RBC: 4.21 Mil/uL (ref 3.87–5.11)
RDW: 16.3 % — ABNORMAL HIGH (ref 11.5–15.5)
WBC: 4.8 10*3/uL (ref 4.0–10.5)

## 2024-11-30 LAB — LIPASE: Lipase: 27 U/L (ref 11.0–59.0)

## 2024-11-30 MED ORDER — HYOSCYAMINE SULFATE 0.125 MG SL SUBL: 0.1250 mg | SUBLINGUAL_TABLET | Freq: Four times a day (QID) | SUBLINGUAL | 4 refills | Status: AC | PRN

## 2024-11-30 MED ORDER — ESOMEPRAZOLE MAGNESIUM 20 MG PO CPDR: 20.0000 mg | DELAYED_RELEASE_CAPSULE | Freq: Two times a day (BID) | ORAL | 4 refills | Status: AC

## 2024-11-30 NOTE — Patient Instructions (Addendum)
 _______________________________________________________  If your blood pressure at your visit was 140/90 or greater, please contact your primary care physician to follow up on this.  _______________________________________________________  If you are age 62 or older, your body mass index should be between 23-30. Your Body mass index is 25.81 kg/m. If this is out of the aforementioned range listed, please consider follow up with your Primary Care Provider.  If you are age 77 or younger, your body mass index should be between 19-25. Your Body mass index is 25.81 kg/m. If this is out of the aformentioned range listed, please consider follow up with your Primary Care Provider.   ________________________________________________________  The [facility] providers would like to encourage you to use MYCHART to communicate with providers for non-urgent requests or questions.  Due to long hold times on the telephone, sending your provider a message by [facility] may be a faster and more efficient way to get a response.  Please allow 48 business hours for a response.  Please remember that this is for non-urgent requests.  _______________________________________________________  [facility] is using a team-based approach to care.  Your team is made up of your doctor and two to three APPS. Our APPS (Nurse Practitioners and Physician Assistants) work with your physician to ensure care continuity for you. They are fully qualified to address your health concerns and develop a treatment plan. They communicate directly with your gastroenterologist to care for you. Seeing the Advanced Practice Practitioners on your physician's team can help you by facilitating care more promptly, often allowing for earlier appointments, access to diagnostic testing, procedures, and other specialty referrals  Your provider has requested that you go to the basement level for lab work before leaving today. Press B on the  elevator. The lab is located at the first door on the left as you exit the elevator.   Nexium  40mg  daily  We have sent the following medications to your pharmacy for you to pick up at your convenience: Levsin  0.125 every 6 hours as needed  Avoid diary, eggs, potatoes, chocolate and caffeine as suggested by Dr. Kozlow.   You have been scheduled for an endoscopy. Please follow written instructions given to you at your visit today.  If you use inhalers (even only as needed), please bring them with you on the day of your procedure.  If you take any of the following medications, they will need to be adjusted prior to your procedure:   DO NOT TAKE 7 DAYS PRIOR TO TEST- Trulicity (dulaglutide) Ozempic, Wegovy (semaglutide) Mounjaro, Zepbound (tirzepatide) Bydureon Bcise (exanatide extended release)  DO NOT TAKE 1 DAY PRIOR TO YOUR TEST Rybelsus (semaglutide) Adlyxin (lixisenatide) Victoza (liraglutide) Byetta (exanatide) ___________________________________________________________________________  It was a pleasure to see you today!  Thank you for trusting me with your gastrointestinal care!

## 2024-11-30 NOTE — Progress Notes (Signed)
 "   Chief Complaint: FU  Referring Provider:  Keren Vicenta BRAVO, MD      ASSESSMENT AND PLAN;   #1. PPI responsive EoE with GERD/NCCP. Dx 05/2014, treated with PPIs and Flovent , did very well on food restriction.  S/P EGD with dil 46Fr 06/27/2021 complicated by UGI bleed.  #2. Epi pain/LUQ pain. S/P cholecystectomy in past. Neg CT AP 11/2023  #3. H/O diverticulitis, treated with antibiotics 11/2018. Has mod-severe sigmoid div with possible muscular hypertrophy/stenosis. Neg colon 02/2024  #4. IBS-C. Neg colon 03/10/2024- no need to rpt  #5.  Liver cyst/R kidney angiomyolipoma.  Plan - Decrease nexium  40 mg p.o. QD #90 4RF. - EGD. - Levsin  0.125mg  Q6hrs prn #120, 2RF. - CBC with diff, CMP, lipase - Avoid diary, eggs, potatoes, chocolate and caffeine as suggested by Dr. Kozlow.  Proceed with EGD. I have discussed the risks and benefits. The risks including rare risk of perforation, bleeding, missed UGI neoplasms, risks of anesthesia/sedation. Alternatives were given. Patient is aware and agrees to proceed. All the questions were answered. This will be scheduled in upcoming days. Consent forms were given for review.  HPI:     History of Present Illness Sherri Ruiz is a 64 year old female with eosinophilic esophagitis, GERD, IBS with constipation, and diverticulosis who presents for evaluation of persistent upper abdominal and chest discomfort.  Complex gastrointestinal history includes eosinophilic esophagitis, GERD, IBS with constipation, and diverticulosis. She underwent esophagogastroduodenoscopy with dilation in September 2022, complicated by upper GI bleed. Gallbladder has been removed. She has a history of renal angiomyolipoma and liver cysts. She is sensitive to medications, experiencing nausea with painkillers and sedatives.  Persistent upper abdominal and chest discomfort worsens after eating and peaks 30 minutes to an hour postprandially. Symptoms intensified in October, leading  to increased Nexium  dosing and use of GI cocktails for relief. She has trialed various dosing regimens, including 80 mg daily for two weeks, then 60 mg, but pain recurred, necessitating a return to higher doses. Current regimen is Nexium  20 mg in the morning and 40 mg at night, with recent use of over-the-counter Nexium  due to running out of her prescription. Attempts to decrease to 20 mg twice daily were unsuccessful due to symptom recurrence. Maalox and GI cocktails are used as needed. Greasy foods exacerbate esophageal symptoms.  Intermittent dysphagia occurs with bread products, donuts, and sandwiches, but not with other foods. No consistent problems swallowing except with these items. Occasional nausea, which was nearly daily for a period, and one episode of vomiting during a recent cruise. Headaches are associated with nausea; she uses Tylenol  but avoids ibuprofen and NSAIDs.  Bowel habits are variable, with episodes of both diarrhea and constipation. Significant diarrhea occurred in October, prompting consideration of a stool sample, but none was submitted. Currently, bowel movements are regular, one to three times daily, with complete evacuation. Occasional small, hard stools, most recently two days ago, and use of stool softener after her cruise for perceived constipation. Increased gas and abdominal spasms, especially after eating vegetables or raw foods; frequent use of gas medicine for relief. Hyoscyamine  was helpful in the past but not used recently.  Dietary restrictions include limiting dairy, eggs, potatoes, chocolate, and caffeine, though adherence is challenging, especially during travel. Currently drinks one cup of caffeinated coffee daily, sometimes up to three cups, which facilitates bowel movements. Avoids carbonated beverages and alcohol except for occasional wine with meals. Chocolate is identified as a stress food. Recent weight gain of five to  six pounds following her  cruise.  Reports episodes of dizziness and a history of fainting in September, associated with minitran . Fatigue and low energy are also attributed to minitran . Difficulty with blood sugar control. Denies current fever or chills.        Wt Readings from Last 3 Encounters:  11/30/24 141 lb 2 oz (64 kg)  03/10/24 136 lb (61.7 kg)  02/16/24 136 lb (61.7 kg)    Prev notes: Does admit that she has been under considerable stress due to family problems (daughters with the grandkids had moved in.  At one point she had 11 people living in her home, she also recently has been promoted in her job).  1 daughter is a engineer, civil (consulting) at surgical floor [name].  Past GI Proc:  EGD with dil 06/27/2021 - Few circular rings in the proximal and distal esophagus without any obvious stricture. Dilated. BX- NEG for EoE - Small hiatal hernia. - Gastritis. Bx- neg for HP - Multiple gastric polyps. Resected and retrieved x 3. Bx-fundic gland polyps - Complicated by UGI bleeding req hospitalization, 2U   Colonoscopy 02/2024: - Moderate predominantly sigmoid diverticulosis. - Non- bleeding internal hemorrhoids. - The examined portion of the ileum was normal. - The examination was otherwise normal on direct and retroflexion views. - Rpt 10 yrs. Earlier, if with any new problems.  CT AP with contrast 11/2023 No acute findings. Stable 5 cm right renal angiomyolipoma.  No evidence of hemorrhage. Stable small hepatic cysts. Severe sigmoid diverticulosis, without radiographic evidence of diverticulitis.    EGD 03/21/2020  -Few circular rings in the distal esophagus (bx-eosinophilic esophagitis Eos> 30 per high-power field) -Small transient hiatal hernia. -Multiple gastric polyps. Bx- fundic gland polyps. -Mild gastritis. Biopsied.   EGD 05/2014-wide-open cervical esophageal web, mild gastritis, gastric polyps.  Eso Bx- showed increased eosinophils over 20 per HPF c/w eosinophilic esophagitis.  Gastric Bx- reactive  gastropathy, intestinal metaplasia.  Subsequent EGD 12/25/2017-resolution of eosinophilic esophagitis on biopsies, gastric mapping negative for intestinal metaplasia, small incidental gastric polyps measuring 4 to 6 mm.  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  Otherwise normal to TI..  10/07/2013-good prep, moderate sigmoid diverticulosis, small internal hemorrhoids.  Otherwise normal to TI.  Next due 09/2023 unless with any new problems. 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  - CT AP 02/2019: IMPRESSION: 1. No acute findings in the abdomen or pelvis. Relatively advanced left colonic diverticulosis without diverticulitis. 2. 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 3. The 16 mm low-density lesion in the anterior right liver may be a complex cysts, but cannot be definitively characterized. MRI of the abdomen with and without contrast recommended to further evaluate. She wanted to hold off on MRI  CT AP with contrast 11/2023 No acute findings. Stable 5 cm right renal angiomyolipoma.  No evidence of hemorrhage. Stable small hepatic cysts. Severe sigmoid diverticulosis, without radiographic evidence of diverticulitis. Past Medical History:  Diagnosis Date   Allergic rhinitis    Allergy     Anxiety    OCC    Asthma    none since on Singulair  started    Cataract    removed right eye, left forming  Congenital clotting factor deficiency (HCC)    Eosinophilic esophagitis    Fall 06/2024   GERD (gastroesophageal reflux disease)    High blood pressure    High  cholesterol    History of hepatitis A    Hypothyroidism    IBS (irritable bowel syndrome)    IDA (iron deficiency anemia)    LPRD (laryngopharyngeal reflux disease)    Penicillin allergy     Peripheral neuropathy    PONV (postoperative nausea and vomiting)    Due to Demerol    Vitamin B12 deficiency anemia     Past Surgical History:  Procedure Laterality Date   CATARACT EXTRACTION  03/2020   CHOLECYSTECTOMY     COLONOSCOPY  02/2020   RG-MAC-poor prep per MD- needs 2 day prep on next colon   ESOPHAGOGASTRODUODENOSCOPY  06/16/2014   Wide open cervial esophageal web. Mild gastrtitis. Gastric polyps.    ESOPHAGOGASTRODUODENOSCOPY  12/25/2017   ESOPHAGOGASTRODUODENOSCOPY  06/27/2021   TOTAL ABDOMINAL HYSTERECTOMY     UPPER GASTROINTESTINAL ENDOSCOPY      Family History  Problem Relation Age of Onset   Clotting disorder Mother    High Cholesterol Mother    Asthma Mother    Stroke Mother    High blood pressure Father    Heart attack Father 66       [place].  Atrial fib.     Colon polyps Father    Cancer Sister    Asthma Daughter    Heart disease Brother        Pacemaker   Stomach cancer Maternal Aunt    Esophageal cancer Other    Colon cancer Neg Hx    Rectal cancer Neg Hx     Social History   Tobacco Use   Smoking status: Former    Current packs/day: 0.00    Types: Cigarettes    Quit date: 1989    Years since quitting: 37.1   Smokeless tobacco: Never  Vaping Use   Vaping status: Never Used  Substance Use Topics   Alcohol use: Not Currently   Drug use: Never    Current Outpatient Medications  Medication Sig Dispense Refill   albuterol (VENTOLIN HFA) 108 (90 Base) MCG/ACT inhaler SMARTSIG:2 Puff(s) By Mouth 4 Times Daily PRN     ALPRAZolam  (XANAX ) 0.25 MG tablet Take 0.25 mg by mouth 2 (two) times daily as needed.     AMBULATORY NON FORMULARY MEDICATION Medication Name: GI Cocktail (Equal amounts of Maalox, Viscous Lidocaine , Bentyl )  Take 10 cc by  mouth every 8 hours as needed. 120 mL 2   atorvastatin  (LIPITOR) 20 MG tablet Take 20 mg by mouth at bedtime.   1   Cetirizine HCl (ZYRTEC PO) Take 1 tablet by mouth daily.      esomeprazole  (NEXIUM ) 40 MG capsule Take 1 capsule (40 mg total) by mouth daily. (Patient taking differently: Take 60 mg by mouth daily.) 90 capsule 4   guaiFENesin (MUCINEX PO) Take 1 tablet by mouth every 6 (six) hours as needed.     hyoscyamine  (LEVSIN  SL) 0.125 MG SL tablet Place 1 tablet (0.125 mg total) under the tongue every 4 (four) hours as needed. 120 tablet 4   levothyroxine  (SYNTHROID ) 50 MCG tablet Take 50 mcg by mouth daily.     lisinopril  (PRINIVIL ,ZESTRIL ) 5 MG tablet Take 5 mg by mouth daily.  (Patient taking differently: Take 20 mg by mouth daily.)     MINIVELLE 0.1 MG/24HR patch Place 1 patch onto the skin  once a week. Friday     montelukast  (SINGULAIR ) 10 MG tablet TAKE 1 TABLET BY MOUTH EVERYDAY AT BEDTIME 90 tablet 3   Multiple Vitamins-Minerals (HAIR SKIN AND NAILS FORMULA PO) Take 1 tablet by mouth daily.     Multiple Vitamins-Minerals (MULTIVITAMIN PO) Take 1 tablet by mouth daily.     Nutritional Supplements (NUTRITIONAL SUPPLEMENT PO) Take by mouth. Vitamin D3 + Zinc + Vitamin C     Probiotic Product (DIGESTIVE ADVANTAGE PO) Take 1 tablet by mouth daily.     Simethicone  (GAS-X PO) Take 1 tablet by mouth 3 (three) times daily as needed.     No current facility-administered medications for this visit.    Allergies  Allergen Reactions   Clindamycin/Lincomycin Swelling   Meperidine Nausea And Vomiting    unknown   Penicillin G Other (See Comments)    Unknown/childhood allergy  Has patient had a PCN reaction causing immediate rash, facial/tongue/throat swelling, SOB or lightheadedness with hypotension: Yes Has patient had a PCN reaction causing severe rash involving mucus membranes or skin necrosis: Unknown Has patient had a PCN reaction that required hospitalization: No Has patient had a PCN  reaction occurring within the last 10 years: Unknown If all of the above answers are NO, then may proceed with Cephalosporin use.     Ativan  [Lorazepam ] Other (See Comments)    Poor clearance, memory loss    Review of Systems:  Negative except for HPI     Physical Exam:    Today's Vitals   11/30/24 0938  BP: 120/66  Pulse: 80  Weight: 141 lb 2 oz (64 kg)  Height: 5' 2 (1.575 m)   Body mass index is 25.81 kg/m. Constitutional:  Well-developed, in no acute distress. Psychiatric: Normal mood and affect. Behavior is normal. HEENT: Pupils normal.  Conjunctivae are normal. No scleral icterus. Cardiovascular: Normal rate, regular rhythm. No edema Pulmonary/chest: Effort normal and breath sounds normal. No wheezing, rales or rhonchi. Abdominal: Soft, nondistended.  Mild epigastric/left upper quadrant tenderness. Bowel sounds active throughout. There are no masses palpable. No hepatomegaly. Neurological: Alert and oriented to person place and time. Skin: Skin is warm and dry. No rashes noted.  Data Reviewed: I have personally reviewed following labs and imaging studies  CBC:    Latest Ref Rng & Units 10/27/2023   11:11 AM 11/12/2022    9:33 AM 06/30/2021    4:14 AM  CBC  WBC 4.0 - 10.5 K/uL 5.3  4.9  4.4   Hemoglobin 12.0 - 15.0 g/dL 87.7  87.7  88.0   Hematocrit 36.0 - 46.0 % 37.1  35.9  35.2   Platelets 150.0 - 400.0 K/uL 315.0  298.0  165     CMP:    Latest Ref Rng & Units 10/27/2023   11:11 AM 11/12/2022    9:33 AM 06/30/2021    4:14 AM  CMP  Glucose 70 - 99 mg/dL 898  77  896   BUN 6 - 23 mg/dL 11  6  7    Creatinine 0.40 - 1.20 mg/dL 9.17  9.15  9.24   Sodium 135 - 145 mEq/L 138  139  143   Potassium 3.5 - 5.1 mEq/L 4.1  3.7  3.4   Chloride 96 - 112 mEq/L 101  101  113   CO2 19 - 32 mEq/L 30  32  26   Calcium  8.4 - 10.5 mg/dL 8.8  8.8  8.4   Total Protein 6.0 - 8.3 g/dL 6.8  6.5  Total Bilirubin 0.2 - 1.2 mg/dL 0.6  0.4    Alkaline Phos 39 - 117 U/L 55  60     AST 0 - 37 U/L 17  19    ALT 0 - 35 U/L 15  16        Anselm Bring, MD 11/30/2024, 9:56 AM  Cc: Sherri Vicenta BRAVO, MD  "

## 2024-11-30 NOTE — Telephone Encounter (Signed)
 Can we see about a PA for Nexium  20mg . She is taking it 2 times a day and wants to see if it is covered by insurance

## 2024-12-01 ENCOUNTER — Telehealth: Payer: Self-pay

## 2024-12-01 ENCOUNTER — Other Ambulatory Visit (HOSPITAL_COMMUNITY): Payer: Self-pay

## 2024-12-01 NOTE — Telephone Encounter (Signed)
 PA request has been Started. New Encounter has been or will be created for follow up. For additional info see Pharmacy Prior Auth telephone encounter from 12-01-2024.

## 2024-12-01 NOTE — Telephone Encounter (Signed)
 Noted.

## 2024-12-01 NOTE — Telephone Encounter (Signed)
 Pharmacy Patient Advocate Encounter   Received notification from Pt Calls Messages that prior authorization for Esomeprazole  Magnesium  20MG  dr capsules is required/requested.   Insurance verification completed.   The patient is insured through [facility].   Per test claim: PA required; PA started via CoverMyMeds. KEY [id number] . Waiting for clinical questions to populate.

## 2024-12-27 ENCOUNTER — Encounter: Admitting: Gastroenterology

## 2025-02-13 ENCOUNTER — Ambulatory Visit: Admitting: Allergy and Immunology
